# Patient Record
Sex: Female | Born: 1970 | Race: White | Hispanic: No | Marital: Single | Smoking: Former smoker
Health system: Southern US, Community
[De-identification: ages and names within clinical notes are randomized; demographics above are authoritative.]

## PROBLEM LIST (undated history)

## (undated) DIAGNOSIS — T7840XA Allergy, unspecified, initial encounter: Secondary | ICD-10-CM

## (undated) DIAGNOSIS — H40009 Preglaucoma, unspecified, unspecified eye: Secondary | ICD-10-CM

## (undated) HISTORY — PX: KNEE ARTHROPLASTY: SHX992

## (undated) HISTORY — PX: WISDOM TOOTH EXTRACTION: SHX21

## (undated) HISTORY — DX: Allergy, unspecified, initial encounter: T78.40XA

## (undated) HISTORY — DX: Preglaucoma, unspecified, unspecified eye: H40.009

---
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
## 1998-04-26 ENCOUNTER — Other Ambulatory Visit: Admission: RE | Admit: 1998-04-26 | Discharge: 1998-04-26 | Payer: Self-pay | Admitting: Obstetrics and Gynecology

## 1998-12-27 ENCOUNTER — Encounter: Admission: RE | Admit: 1998-12-27 | Discharge: 1999-01-12 | Payer: Self-pay | Admitting: Orthopedic Surgery

## 1999-10-31 ENCOUNTER — Other Ambulatory Visit: Admission: RE | Admit: 1999-10-31 | Discharge: 1999-10-31 | Payer: Self-pay | Admitting: *Deleted

## 2001-04-08 ENCOUNTER — Other Ambulatory Visit: Admission: RE | Admit: 2001-04-08 | Discharge: 2001-04-08 | Payer: Self-pay | Admitting: *Deleted

## 2002-10-01 HISTORY — PX: LEEP: SHX91

## 2003-07-26 ENCOUNTER — Other Ambulatory Visit: Admission: RE | Admit: 2003-07-26 | Discharge: 2003-07-26 | Payer: Self-pay | Admitting: Obstetrics and Gynecology

## 2004-05-08 ENCOUNTER — Other Ambulatory Visit: Admission: RE | Admit: 2004-05-08 | Discharge: 2004-05-08 | Payer: Self-pay | Admitting: Internal Medicine

## 2005-06-13 ENCOUNTER — Other Ambulatory Visit: Admission: RE | Admit: 2005-06-13 | Discharge: 2005-06-13 | Payer: Self-pay | Admitting: Internal Medicine

## 2007-05-07 ENCOUNTER — Emergency Department (HOSPITAL_COMMUNITY): Admission: EM | Admit: 2007-05-07 | Discharge: 2007-05-07 | Payer: Self-pay | Admitting: Emergency Medicine

## 2007-06-26 ENCOUNTER — Encounter: Admission: RE | Admit: 2007-06-26 | Discharge: 2007-08-20 | Payer: Self-pay | Admitting: Orthopedic Surgery

## 2011-07-16 LAB — SAMPLE TO BLOOD BANK

## 2011-07-16 LAB — DIFFERENTIAL
Basophils Absolute: 0
Basophils Relative: 1
Eosinophils Absolute: 0.2
Eosinophils Relative: 2
Lymphocytes Relative: 30
Lymphs Abs: 2.4
Monocytes Absolute: 0.8 — ABNORMAL HIGH
Monocytes Relative: 9
Neutro Abs: 4.8
Neutrophils Relative %: 58

## 2011-07-16 LAB — I-STAT 8, (EC8 V) (CONVERTED LAB)
Acid-Base Excess: 1
BUN: 13
Chloride: 107
HCT: 45
Hemoglobin: 15.3 — ABNORMAL HIGH
Operator id: 272551
Sodium: 137
pCO2, Ven: 26.3 — ABNORMAL LOW

## 2011-07-16 LAB — POCT I-STAT CREATININE
Creatinine, Ser: 0.7
Operator id: 272551

## 2011-07-16 LAB — CBC
HCT: 40.7
Hemoglobin: 14.5
MCHC: 35.6
MCV: 91.8
Platelets: 286
RBC: 4.43
RDW: 12.9
WBC: 8.2

## 2015-04-25 ENCOUNTER — Encounter: Payer: Self-pay | Admitting: Family Medicine

## 2015-04-25 ENCOUNTER — Ambulatory Visit (INDEPENDENT_AMBULATORY_CARE_PROVIDER_SITE_OTHER): Payer: Self-pay | Admitting: Family Medicine

## 2015-04-25 VITALS — BP 112/70 | HR 80 | Temp 98.1°F | Resp 18 | Ht 64.0 in | Wt 187.0 lb

## 2015-04-25 DIAGNOSIS — Z0000 Encounter for general adult medical examination without abnormal findings: Secondary | ICD-10-CM

## 2015-04-25 NOTE — Progress Notes (Signed)
Pre visit review using our clinic review tool, if applicable. No additional management support is needed unless otherwise documented below in the visit note. 

## 2015-04-25 NOTE — Patient Instructions (Signed)
Preventive Care for Adults A healthy lifestyle and preventive care can promote health and wellness. Preventive health guidelines for women include the following key practices.  A routine yearly physical is a good way to check with your health care provider about your health and preventive screening. It is a chance to share any concerns and updates on your health and to receive a thorough exam.  Visit your dentist for a routine exam and preventive care every 6 months. Brush your teeth twice a day and floss once a day. Good oral hygiene prevents tooth decay and gum disease.  The frequency of eye exams is based on your age, health, family medical history, use of contact lenses, and other factors. Follow your health care provider's recommendations for frequency of eye exams.  Eat a healthy diet. Foods like vegetables, fruits, whole grains, low-fat dairy products, and lean protein foods contain the nutrients you need without too many calories. Decrease your intake of foods high in solid fats, added sugars, and salt. Eat the right amount of calories for you.Get information about a proper diet from your health care provider, if necessary.  Regular physical exercise is one of the most important things you can do for your health. Most adults should get at least 150 minutes of moderate-intensity exercise (any activity that increases your heart rate and causes you to sweat) each week. In addition, most adults need muscle-strengthening exercises on 2 or more days a week.  Maintain a healthy weight. The body mass index (BMI) is a screening tool to identify possible weight problems. It provides an estimate of body fat based on height and weight. Your health care provider can find your BMI and can help you achieve or maintain a healthy weight.For adults 20 years and older:  A BMI below 18.5 is considered underweight.  A BMI of 18.5 to 24.9 is normal.  A BMI of 25 to 29.9 is considered overweight.  A BMI of  30 and above is considered obese.  Maintain normal blood lipids and cholesterol levels by exercising and minimizing your intake of saturated fat. Eat a balanced diet with plenty of fruit and vegetables. Blood tests for lipids and cholesterol should begin at age 76 and be repeated every 5 years. If your lipid or cholesterol levels are high, you are over 50, or you are at high risk for heart disease, you may need your cholesterol levels checked more frequently.Ongoing high lipid and cholesterol levels should be treated with medicines if diet and exercise are not working.  If you smoke, find out from your health care provider how to quit. If you do not use tobacco, do not start.  Lung cancer screening is recommended for adults aged 22-80 years who are at high risk for developing lung cancer because of a history of smoking. A yearly low-dose CT scan of the lungs is recommended for people who have at least a 30-pack-year history of smoking and are a current smoker or have quit within the past 15 years. A pack year of smoking is smoking an average of 1 pack of cigarettes a day for 1 year (for example: 1 pack a day for 30 years or 2 packs a day for 15 years). Yearly screening should continue until the smoker has stopped smoking for at least 15 years. Yearly screening should be stopped for people who develop a health problem that would prevent them from having lung cancer treatment.  If you are pregnant, do not drink alcohol. If you are breastfeeding,  be very cautious about drinking alcohol. If you are not pregnant and choose to drink alcohol, do not have more than 1 drink per day. One drink is considered to be 12 ounces (355 mL) of beer, 5 ounces (148 mL) of wine, or 1.5 ounces (44 mL) of liquor.  Avoid use of street drugs. Do not share needles with anyone. Ask for help if you need support or instructions about stopping the use of drugs.  High blood pressure causes heart disease and increases the risk of  stroke. Your blood pressure should be checked at least every 1 to 2 years. Ongoing high blood pressure should be treated with medicines if weight loss and exercise do not work.  If you are 75-52 years old, ask your health care provider if you should take aspirin to prevent strokes.  Diabetes screening involves taking a blood sample to check your fasting blood sugar level. This should be done once every 3 years, after age 15, if you are within normal weight and without risk factors for diabetes. Testing should be considered at a younger age or be carried out more frequently if you are overweight and have at least 1 risk factor for diabetes.  Breast cancer screening is essential preventive care for women. You should practice "breast self-awareness." This means understanding the normal appearance and feel of your breasts and may include breast self-examination. Any changes detected, no matter how small, should be reported to a health care provider. Women in their 58s and 30s should have a clinical breast exam (CBE) by a health care provider as part of a regular health exam every 1 to 3 years. After age 16, women should have a CBE every year. Starting at age 53, women should consider having a mammogram (breast X-ray test) every year. Women who have a family history of breast cancer should talk to their health care provider about genetic screening. Women at a high risk of breast cancer should talk to their health care providers about having an MRI and a mammogram every year.  Breast cancer gene (BRCA)-related cancer risk assessment is recommended for women who have family members with BRCA-related cancers. BRCA-related cancers include breast, ovarian, tubal, and peritoneal cancers. Having family members with these cancers may be associated with an increased risk for harmful changes (mutations) in the breast cancer genes BRCA1 and BRCA2. Results of the assessment will determine the need for genetic counseling and  BRCA1 and BRCA2 testing.  Routine pelvic exams to screen for cancer are no longer recommended for nonpregnant women who are considered low risk for cancer of the pelvic organs (ovaries, uterus, and vagina) and who do not have symptoms. Ask your health care provider if a screening pelvic exam is right for you.  If you have had past treatment for cervical cancer or a condition that could lead to cancer, you need Pap tests and screening for cancer for at least 20 years after your treatment. If Pap tests have been discontinued, your risk factors (such as having a new sexual partner) need to be reassessed to determine if screening should be resumed. Some women have medical problems that increase the chance of getting cervical cancer. In these cases, your health care provider may recommend more frequent screening and Pap tests.  The HPV test is an additional test that may be used for cervical cancer screening. The HPV test looks for the virus that can cause the cell changes on the cervix. The cells collected during the Pap test can be  tested for HPV. The HPV test could be used to screen women aged 30 years and older, and should be used in women of any age who have unclear Pap test results. After the age of 30, women should have HPV testing at the same frequency as a Pap test.  Colorectal cancer can be detected and often prevented. Most routine colorectal cancer screening begins at the age of 50 years and continues through age 75 years. However, your health care provider may recommend screening at an earlier age if you have risk factors for colon cancer. On a yearly basis, your health care provider may provide home test kits to check for hidden blood in the stool. Use of a small camera at the end of a tube, to directly examine the colon (sigmoidoscopy or colonoscopy), can detect the earliest forms of colorectal cancer. Talk to your health care provider about this at age 50, when routine screening begins. Direct  exam of the colon should be repeated every 5-10 years through age 75 years, unless early forms of pre-cancerous polyps or small growths are found.  People who are at an increased risk for hepatitis B should be screened for this virus. You are considered at high risk for hepatitis B if:  You were born in a country where hepatitis B occurs often. Talk with your health care provider about which countries are considered high risk.  Your parents were born in a high-risk country and you have not received a shot to protect against hepatitis B (hepatitis B vaccine).  You have HIV or AIDS.  You use needles to inject street drugs.  You live with, or have sex with, someone who has hepatitis B.  You get hemodialysis treatment.  You take certain medicines for conditions like cancer, organ transplantation, and autoimmune conditions.  Hepatitis C blood testing is recommended for all people born from 1945 through 1965 and any individual with known risks for hepatitis C.  Practice safe sex. Use condoms and avoid high-risk sexual practices to reduce the spread of sexually transmitted infections (STIs). STIs include gonorrhea, chlamydia, syphilis, trichomonas, herpes, HPV, and human immunodeficiency virus (HIV). Herpes, HIV, and HPV are viral illnesses that have no cure. They can result in disability, cancer, and death.  You should be screened for sexually transmitted illnesses (STIs) including gonorrhea and chlamydia if:  You are sexually active and are younger than 24 years.  You are older than 24 years and your health care provider tells you that you are at risk for this type of infection.  Your sexual activity has changed since you were last screened and you are at an increased risk for chlamydia or gonorrhea. Ask your health care provider if you are at risk.  If you are at risk of being infected with HIV, it is recommended that you take a prescription medicine daily to prevent HIV infection. This is  called preexposure prophylaxis (PrEP). You are considered at risk if:  You are a heterosexual woman, are sexually active, and are at increased risk for HIV infection.  You take drugs by injection.  You are sexually active with a partner who has HIV.  Talk with your health care provider about whether you are at high risk of being infected with HIV. If you choose to begin PrEP, you should first be tested for HIV. You should then be tested every 3 months for as long as you are taking PrEP.  Osteoporosis is a disease in which the bones lose minerals and strength   with aging. This can result in serious bone fractures or breaks. The risk of osteoporosis can be identified using a bone density scan. Women ages 65 years and over and women at risk for fractures or osteoporosis should discuss screening with their health care providers. Ask your health care provider whether you should take a calcium supplement or vitamin D to reduce the rate of osteoporosis.  Menopause can be associated with physical symptoms and risks. Hormone replacement therapy is available to decrease symptoms and risks. You should talk to your health care provider about whether hormone replacement therapy is right for you.  Use sunscreen. Apply sunscreen liberally and repeatedly throughout the day. You should seek shade when your shadow is shorter than you. Protect yourself by wearing long sleeves, pants, a wide-brimmed hat, and sunglasses year round, whenever you are outdoors.  Once a month, do a whole body skin exam, using a mirror to look at the skin on your back. Tell your health care provider of new moles, moles that have irregular borders, moles that are larger than a pencil eraser, or moles that have changed in shape or color.  Stay current with required vaccines (immunizations).  Influenza vaccine. All adults should be immunized every year.  Tetanus, diphtheria, and acellular pertussis (Td, Tdap) vaccine. Pregnant women should  receive 1 dose of Tdap vaccine during each pregnancy. The dose should be obtained regardless of the length of time since the last dose. Immunization is preferred during the 27th-36th week of gestation. An adult who has not previously received Tdap or who does not know her vaccine status should receive 1 dose of Tdap. This initial dose should be followed by tetanus and diphtheria toxoids (Td) booster doses every 10 years. Adults with an unknown or incomplete history of completing a 3-dose immunization series with Td-containing vaccines should begin or complete a primary immunization series including a Tdap dose. Adults should receive a Td booster every 10 years.  Varicella vaccine. An adult without evidence of immunity to varicella should receive 2 doses or a second dose if she has previously received 1 dose. Pregnant females who do not have evidence of immunity should receive the first dose after pregnancy. This first dose should be obtained before leaving the health care facility. The second dose should be obtained 4-8 weeks after the first dose.  Human papillomavirus (HPV) vaccine. Females aged 13-26 years who have not received the vaccine previously should obtain the 3-dose series. The vaccine is not recommended for use in pregnant females. However, pregnancy testing is not needed before receiving a dose. If a female is found to be pregnant after receiving a dose, no treatment is needed. In that case, the remaining doses should be delayed until after the pregnancy. Immunization is recommended for any person with an immunocompromised condition through the age of 26 years if she did not get any or all doses earlier. During the 3-dose series, the second dose should be obtained 4-8 weeks after the first dose. The third dose should be obtained 24 weeks after the first dose and 16 weeks after the second dose.  Zoster vaccine. One dose is recommended for adults aged 60 years or older unless certain conditions are  present.  Measles, mumps, and rubella (MMR) vaccine. Adults born before 1957 generally are considered immune to measles and mumps. Adults born in 1957 or later should have 1 or more doses of MMR vaccine unless there is a contraindication to the vaccine or there is laboratory evidence of immunity to   each of the three diseases. A routine second dose of MMR vaccine should be obtained at least 28 days after the first dose for students attending postsecondary schools, health care workers, or international travelers. People who received inactivated measles vaccine or an unknown type of measles vaccine during 1963-1967 should receive 2 doses of MMR vaccine. People who received inactivated mumps vaccine or an unknown type of mumps vaccine before 1979 and are at high risk for mumps infection should consider immunization with 2 doses of MMR vaccine. For females of childbearing age, rubella immunity should be determined. If there is no evidence of immunity, females who are not pregnant should be vaccinated. If there is no evidence of immunity, females who are pregnant should delay immunization until after pregnancy. Unvaccinated health care workers born before 1957 who lack laboratory evidence of measles, mumps, or rubella immunity or laboratory confirmation of disease should consider measles and mumps immunization with 2 doses of MMR vaccine or rubella immunization with 1 dose of MMR vaccine.  Pneumococcal 13-valent conjugate (PCV13) vaccine. When indicated, a person who is uncertain of her immunization history and has no record of immunization should receive the PCV13 vaccine. An adult aged 19 years or older who has certain medical conditions and has not been previously immunized should receive 1 dose of PCV13 vaccine. This PCV13 should be followed with a dose of pneumococcal polysaccharide (PPSV23) vaccine. The PPSV23 vaccine dose should be obtained at least 8 weeks after the dose of PCV13 vaccine. An adult aged 19  years or older who has certain medical conditions and previously received 1 or more doses of PPSV23 vaccine should receive 1 dose of PCV13. The PCV13 vaccine dose should be obtained 1 or more years after the last PPSV23 vaccine dose.  Pneumococcal polysaccharide (PPSV23) vaccine. When PCV13 is also indicated, PCV13 should be obtained first. All adults aged 65 years and older should be immunized. An adult younger than age 65 years who has certain medical conditions should be immunized. Any person who resides in a nursing home or long-term care facility should be immunized. An adult smoker should be immunized. People with an immunocompromised condition and certain other conditions should receive both PCV13 and PPSV23 vaccines. People with human immunodeficiency virus (HIV) infection should be immunized as soon as possible after diagnosis. Immunization during chemotherapy or radiation therapy should be avoided. Routine use of PPSV23 vaccine is not recommended for American Indians, [place] Natives, or people younger than 65 years unless there are medical conditions that require PPSV23 vaccine. When indicated, people who have unknown immunization and have no record of immunization should receive PPSV23 vaccine. One-time revaccination 5 years after the first dose of PPSV23 is recommended for people aged 19-64 years who have chronic kidney failure, nephrotic syndrome, asplenia, or immunocompromised conditions. People who received 1-2 doses of PPSV23 before age 65 years should receive another dose of PPSV23 vaccine at age 65 years or later if at least 5 years have passed since the previous dose. Doses of PPSV23 are not needed for people immunized with PPSV23 at or after age 65 years.  Meningococcal vaccine. Adults with asplenia or persistent complement component deficiencies should receive 2 doses of quadrivalent meningococcal conjugate (MenACWY-D) vaccine. The doses should be obtained at least 2 months apart.  Microbiologists working with certain meningococcal bacteria, military recruits, people at risk during an outbreak, and people who travel to or live in countries with a high rate of meningitis should be immunized. A first-year college student up through age   21 years who is living in a residence hall should receive a dose if she did not receive a dose on or after her 16th birthday. Adults who have certain high-risk conditions should receive one or more doses of vaccine.  Hepatitis A vaccine. Adults who wish to be protected from this disease, have certain high-risk conditions, work with hepatitis A-infected animals, work in hepatitis A research labs, or travel to or work in countries with a high rate of hepatitis A should be immunized. Adults who were previously unvaccinated and who anticipate close contact with an international adoptee during the first 60 days after arrival in the [place] from a country with a high rate of hepatitis A should be immunized.  Hepatitis B vaccine. Adults who wish to be protected from this disease, have certain high-risk conditions, may be exposed to blood or other infectious body fluids, are household contacts or sex partners of hepatitis B positive people, are clients or workers in certain care facilities, or travel to or work in countries with a high rate of hepatitis B should be immunized.  Haemophilus influenzae type b (Hib) vaccine. A previously unvaccinated person with asplenia or sickle cell disease or having a scheduled splenectomy should receive 1 dose of Hib vaccine. Regardless of previous immunization, a recipient of a hematopoietic stem cell transplant should receive a 3-dose series 6-12 months after her successful transplant. Hib vaccine is not recommended for adults with HIV infection. Preventive Services / Frequency Ages 64 to 68 years  Blood pressure check.** / Every 1 to 2 years.  Lipid and cholesterol check.** / Every 5 years beginning at age  22.  Clinical breast exam.** / Every 3 years for women in their 88s and 53s.  BRCA-related cancer risk assessment.** / For women who have family members with a BRCA-related cancer (breast, ovarian, tubal, or peritoneal cancers).  Pap test.** / Every 2 years from ages 90 through 51. Every 3 years starting at age 21 through age 56 or 3 with a history of 3 consecutive normal Pap tests.  HPV screening.** / Every 3 years from ages 24 through ages 1 to 46 with a history of 3 consecutive normal Pap tests.  Hepatitis C blood test.** / For any individual with known risks for hepatitis C.  Skin self-exam. / Monthly.  Influenza vaccine. / Every year.  Tetanus, diphtheria, and acellular pertussis (Tdap, Td) vaccine.** / Consult your health care provider. Pregnant women should receive 1 dose of Tdap vaccine during each pregnancy. 1 dose of Td every 10 years.  Varicella vaccine.** / Consult your health care provider. Pregnant females who do not have evidence of immunity should receive the first dose after pregnancy.  HPV vaccine. / 3 doses over 6 months, if 72 and younger. The vaccine is not recommended for use in pregnant females. However, pregnancy testing is not needed before receiving a dose.  Measles, mumps, rubella (MMR) vaccine.** / You need at least 1 dose of MMR if you were born in 1957 or later. You may also need a 2nd dose. For females of childbearing age, rubella immunity should be determined. If there is no evidence of immunity, females who are not pregnant should be vaccinated. If there is no evidence of immunity, females who are pregnant should delay immunization until after pregnancy.  Pneumococcal 13-valent conjugate (PCV13) vaccine.** / Consult your health care provider.  Pneumococcal polysaccharide (PPSV23) vaccine.** / 1 to 2 doses if you smoke cigarettes or if you have certain conditions.  Meningococcal vaccine.** /  1 dose if you are age 19 to 21 years and a first-year college  student living in a residence hall, or have one of several medical conditions, you need to get vaccinated against meningococcal disease. You may also need additional booster doses.  Hepatitis A vaccine.** / Consult your health care provider.  Hepatitis B vaccine.** / Consult your health care provider.  Haemophilus influenzae type b (Hib) vaccine.** / Consult your health care provider. Ages 40 to 64 years  Blood pressure check.** / Every 1 to 2 years.  Lipid and cholesterol check.** / Every 5 years beginning at age 20 years.  Lung cancer screening. / Every year if you are aged 55-80 years and have a 30-pack-year history of smoking and currently smoke or have quit within the past 15 years. Yearly screening is stopped once you have quit smoking for at least 15 years or develop a health problem that would prevent you from having lung cancer treatment.  Clinical breast exam.** / Every year after age 40 years.  BRCA-related cancer risk assessment.** / For women who have family members with a BRCA-related cancer (breast, ovarian, tubal, or peritoneal cancers).  Mammogram.** / Every year beginning at age 40 years and continuing for as long as you are in good health. Consult with your health care provider.  Pap test.** / Every 3 years starting at age 30 years through age 65 or 70 years with a history of 3 consecutive normal Pap tests.  HPV screening.** / Every 3 years from ages 30 years through ages 65 to 70 years with a history of 3 consecutive normal Pap tests.  Fecal occult blood test (FOBT) of stool. / Every year beginning at age 50 years and continuing until age 75 years. You may not need to do this test if you get a colonoscopy every 10 years.  Flexible sigmoidoscopy or colonoscopy.** / Every 5 years for a flexible sigmoidoscopy or every 10 years for a colonoscopy beginning at age 50 years and continuing until age 75 years.  Hepatitis C blood test.** / For all people born from 1945 through  1965 and any individual with known risks for hepatitis C.  Skin self-exam. / Monthly.  Influenza vaccine. / Every year.  Tetanus, diphtheria, and acellular pertussis (Tdap/Td) vaccine.** / Consult your health care provider. Pregnant women should receive 1 dose of Tdap vaccine during each pregnancy. 1 dose of Td every 10 years.  Varicella vaccine.** / Consult your health care provider. Pregnant females who do not have evidence of immunity should receive the first dose after pregnancy.  Zoster vaccine.** / 1 dose for adults aged 60 years or older.  Measles, mumps, rubella (MMR) vaccine.** / You need at least 1 dose of MMR if you were born in 1957 or later. You may also need a 2nd dose. For females of childbearing age, rubella immunity should be determined. If there is no evidence of immunity, females who are not pregnant should be vaccinated. If there is no evidence of immunity, females who are pregnant should delay immunization until after pregnancy.  Pneumococcal 13-valent conjugate (PCV13) vaccine.** / Consult your health care provider.  Pneumococcal polysaccharide (PPSV23) vaccine.** / 1 to 2 doses if you smoke cigarettes or if you have certain conditions.  Meningococcal vaccine.** / Consult your health care provider.  Hepatitis A vaccine.** / Consult your health care provider.  Hepatitis B vaccine.** / Consult your health care provider.  Haemophilus influenzae type b (Hib) vaccine.** / Consult your health care provider. Ages 65   years and over  Blood pressure check.** / Every 1 to 2 years.  Lipid and cholesterol check.** / Every 5 years beginning at age 22 years.  Lung cancer screening. / Every year if you are aged 73-80 years and have a 30-pack-year history of smoking and currently smoke or have quit within the past 15 years. Yearly screening is stopped once you have quit smoking for at least 15 years or develop a health problem that would prevent you from having lung cancer  treatment.  Clinical breast exam.** / Every year after age 4 years.  BRCA-related cancer risk assessment.** / For women who have family members with a BRCA-related cancer (breast, ovarian, tubal, or peritoneal cancers).  Mammogram.** / Every year beginning at age 40 years and continuing for as long as you are in good health. Consult with your health care provider.  Pap test.** / Every 3 years starting at age 9 years through age 34 or 91 years with 3 consecutive normal Pap tests. Testing can be stopped between 65 and 70 years with 3 consecutive normal Pap tests and no abnormal Pap or HPV tests in the past 10 years.  HPV screening.** / Every 3 years from ages 57 years through ages 64 or 45 years with a history of 3 consecutive normal Pap tests. Testing can be stopped between 65 and 70 years with 3 consecutive normal Pap tests and no abnormal Pap or HPV tests in the past 10 years.  Fecal occult blood test (FOBT) of stool. / Every year beginning at age 15 years and continuing until age 17 years. You may not need to do this test if you get a colonoscopy every 10 years.  Flexible sigmoidoscopy or colonoscopy.** / Every 5 years for a flexible sigmoidoscopy or every 10 years for a colonoscopy beginning at age 86 years and continuing until age 71 years.  Hepatitis C blood test.** / For all people born from [birth year] through 1966 and any individual with known risks for hepatitis C.  Osteoporosis screening.** / A one-time screening for women ages 83 years and over and women at risk for fractures or osteoporosis.  Skin self-exam. / Monthly.  Influenza vaccine. / Every year.  Tetanus, diphtheria, and acellular pertussis (Tdap/Td) vaccine.** / 1 dose of Td every 10 years.  Varicella vaccine.** / Consult your health care provider.  Zoster vaccine.** / 1 dose for adults aged 61 years or older.  Pneumococcal 13-valent conjugate (PCV13) vaccine.** / Consult your health care provider.  Pneumococcal  polysaccharide (PPSV23) vaccine.** / 1 dose for all adults aged 28 years and older.  Meningococcal vaccine.** / Consult your health care provider.  Hepatitis A vaccine.** / Consult your health care provider.  Hepatitis B vaccine.** / Consult your health care provider.  Haemophilus influenzae type b (Hib) vaccine.** / Consult your health care provider. ** Family history and personal history of risk and conditions may change your health care provider's recommendations. Document Released: 11/13/2001 Document Revised: 02/01/2014 Document Reviewed: 02/12/2011 [facility] Patient Information 2015 [place], [place]. This information is not intended to replace advice given to you by your health care provider. Make sure you discuss any questions you have with your health care provider.

## 2015-04-25 NOTE — Progress Notes (Signed)
Subjective:     Cindy Christian is a 44 y.o. female and is here for a comprehensive physical exam. The patient reports problems - LUQ pain---she noticed 5 years ago a tearing sensation while doing a plank.  .  History   Social History  . Marital Status: Single    Spouse Name: N/A  . Number of Children: N/A  . Years of Education: N/A   Occupational History  . Not on file.   Social History Main Topics  . Smoking status: Former Research scientist (life sciences)  . Smokeless tobacco: Not on file  . Alcohol Use: 0.0 oz/week    0 Standard drinks or equivalent per week  . Drug Use: No  . Sexual Activity: Not on file   Other Topics Concern  . Not on file   Social History Narrative  . No narrative on file   Health Maintenance  Topic Date Due  . TETANUS/TDAP  11/14/1988  . HIV Screening  04/24/2016 (Originally 11/14/1984)  . INFLUENZA VACCINE  05/02/2015  . PAP SMEAR  08/13/2015    The following portions of the patient's history were reviewed and updated as appropriate:  She  has a past medical history of Borderline glaucoma. She  does not have a problem list on file. She  has past surgical history that includes Knee Arthroplasty (Bilateral, 2000 R 2008 L); Finger fracture surgery (Left, 1983); and LEEP (2004). Her family history includes Diabetes in her maternal uncle; Stroke in her maternal grandmother. She  reports that she quit smoking about 17 years ago. She does not have any smokeless tobacco history on file. She reports that she drinks about 9.6 oz of alcohol per week. She reports that she does not use illicit drugs. She has a current medication list which includes the following prescription(s): b complex vitamins, ginseng, kelp, turmeric, and vitamin e. No current outpatient prescriptions on file prior to visit.   No current facility-administered medications on file prior to visit.   She is allergic to penicillins..  Review of Systems Review of Systems  Constitutional: Negative for activity  change, appetite change and fatigue.  HENT: Negative for hearing loss, congestion, tinnitus and ear discharge.  dentist q6m Eyes: Negative for visual disturbance (see optho q1y -- vision corrected to 20/20 with glasses and contacts).  Respiratory: Negative for cough, chest tightness and shortness of breath.   Cardiovascular: Negative for chest pain, palpitations and leg swelling.  Gastrointestinal: Negative for abdominal pain, diarrhea, constipation and abdominal distention.  Genitourinary: Negative for urgency, frequency, decreased urine volume and difficulty urinating.  Musculoskeletal: Negative for back pain, arthralgias and gait problem.  Skin: Negative for color change, pallor and rash.  Neurological: Negative for dizziness, light-headedness, numbness and headaches.  Hematological: Negative for adenopathy. Does not bruise/bleed easily.  Psychiatric/Behavioral: Negative for suicidal ideas, confusion, sleep disturbance, self-injury, dysphoric mood, decreased concentration and agitation.       Objective:    BP 112/70 mmHg  Pulse 80  Temp(Src) 98.1 F (36.7 C) (Oral)  Resp 18  Ht 5\' 4"  (1.626 m)  Wt 187 lb (84.823 kg)  BMI 32.08 kg/m2  SpO2 98%  LMP 04/22/2015 General appearance: alert, cooperative, appears stated age and no distress Head: Normocephalic, without obvious abnormality, atraumatic Eyes: conjunctivae/corneas clear. PERRL, EOM's intact. Fundi benign. Ears: normal TM's and external ear canals both ears Nose: Nares normal. Septum midline. Mucosa normal. No drainage or sinus tenderness. Throat: lips, mucosa, and tongue normal; teeth and gums normal Neck: no adenopathy, no carotid bruit, no JVD,  supple, symmetrical, trachea midline and thyroid not enlarged, symmetric, no tenderness/mass/nodules Back: symmetric, no curvature. ROM normal. No CVA tenderness. Lungs: clear to auscultation bilaterally Breast-- deferred Heart: S1, S2 normal Abdomen: soft, non-tender; bowel  sounds normal; no masses,  no organomegaly Pelvic: deferred Extremities: extremities normal, atraumatic, no cyanosis or edema Pulses: 2+ and symmetric Skin: Skin color, texture, turgor normal. No rashes or lesions Lymph nodes: Cervical, supraclavicular, and axillary nodes normal. Neurologic: Alert and oriented X 3, normal strength and tone. Normal symmetric reflexes. Normal coordination and gait Psych--no depression,no anxiety      Assessment:    Healthy female exam.     Plan:  Check labs    ghm utd See After Visit Summary for Counseling Recommendations   1. Preventative health care   - Basic metabolic panel; Future - CBC with Differential/Platelet; Future - Hepatic function panel; Future - Lipid panel; Future - POCT urinalysis dipstick; Future - TSH; Future

## 2015-04-28 ENCOUNTER — Other Ambulatory Visit (INDEPENDENT_AMBULATORY_CARE_PROVIDER_SITE_OTHER): Payer: Self-pay

## 2015-04-28 DIAGNOSIS — Z0000 Encounter for general adult medical examination without abnormal findings: Secondary | ICD-10-CM

## 2015-04-28 LAB — CBC WITH DIFFERENTIAL/PLATELET
Basophils Absolute: 0 10*3/uL (ref 0.0–0.1)
Basophils Relative: 0.7 % (ref 0.0–3.0)
EOS ABS: 0.2 10*3/uL (ref 0.0–0.7)
EOS PCT: 2.8 % (ref 0.0–5.0)
HEMATOCRIT: 39.5 % (ref 36.0–46.0)
Hemoglobin: 13.5 g/dL (ref 12.0–15.0)
LYMPHS ABS: 2.1 10*3/uL (ref 0.7–4.0)
Lymphocytes Relative: 38.4 % (ref 12.0–46.0)
MCHC: 34.2 g/dL (ref 30.0–36.0)
MCV: 93.6 fl (ref 78.0–100.0)
MONO ABS: 0.5 10*3/uL (ref 0.1–1.0)
MONOS PCT: 8.4 % (ref 3.0–12.0)
NEUTROS PCT: 49.7 % (ref 43.0–77.0)
Neutro Abs: 2.7 10*3/uL (ref 1.4–7.7)
PLATELETS: 265 10*3/uL (ref 150.0–400.0)
RBC: 4.22 Mil/uL (ref 3.87–5.11)
RDW: 13 % (ref 11.5–15.5)
WBC: 5.4 10*3/uL (ref 4.0–10.5)

## 2015-04-28 LAB — LIPID PANEL
CHOL/HDL RATIO: 3
Cholesterol: 175 mg/dL (ref 0–200)
HDL: 50.9 mg/dL (ref >39.00)
LDL CALC: 107 mg/dL — AB (ref 0–99)
NONHDL: 124.49
TRIGLYCERIDES: 87 mg/dL (ref 0.0–149.0)
VLDL: 17.4 mg/dL (ref 0.0–40.0)

## 2015-04-28 LAB — BASIC METABOLIC PANEL
BUN: 15 mg/dL (ref 6–23)
CALCIUM: 8.8 mg/dL (ref 8.4–10.5)
CHLORIDE: 104 meq/L (ref 96–112)
CO2: 27 meq/L (ref 19–32)
Creatinine, Ser: 0.59 mg/dL (ref 0.40–1.20)
GFR: 116.92 mL/min (ref >60.00)
GLUCOSE: 80 mg/dL (ref 70–99)
POTASSIUM: 3.8 meq/L (ref 3.5–5.1)
SODIUM: 137 meq/L (ref 135–145)

## 2015-04-28 LAB — HEPATIC FUNCTION PANEL
ALBUMIN: 4.2 g/dL (ref 3.5–5.2)
ALK PHOS: 44 U/L (ref 39–117)
ALT: 14 U/L (ref 0–35)
AST: 19 U/L (ref 0–37)
Bilirubin, Direct: 0.2 mg/dL (ref 0.0–0.3)
Total Bilirubin: 0.8 mg/dL (ref 0.2–1.2)
Total Protein: 7.4 g/dL (ref 6.0–8.3)

## 2015-04-28 LAB — TSH: TSH: 2.23 u[IU]/mL (ref 0.35–4.50)

## 2017-08-02 ENCOUNTER — Encounter: Payer: Self-pay | Admitting: Family Medicine

## 2017-08-02 ENCOUNTER — Ambulatory Visit (HOSPITAL_BASED_OUTPATIENT_CLINIC_OR_DEPARTMENT_OTHER)
Admission: RE | Admit: 2017-08-02 | Discharge: 2017-08-02 | Disposition: A | Discharge status: Home / Self Care | Payer: Self-pay | Source: Ambulatory Visit | Attending: Family Medicine | Admitting: Family Medicine

## 2017-08-02 ENCOUNTER — Other Ambulatory Visit (HOSPITAL_COMMUNITY)
Admission: RE | Admit: 2017-08-02 | Discharge: 2017-08-02 | Disposition: A | Discharge status: Home / Self Care | Payer: Self-pay | Source: Ambulatory Visit | Attending: Family Medicine | Admitting: Family Medicine

## 2017-08-02 ENCOUNTER — Encounter (HOSPITAL_BASED_OUTPATIENT_CLINIC_OR_DEPARTMENT_OTHER): Payer: Self-pay

## 2017-08-02 ENCOUNTER — Other Ambulatory Visit: Payer: Self-pay | Admitting: Family Medicine

## 2017-08-02 ENCOUNTER — Ambulatory Visit (INDEPENDENT_AMBULATORY_CARE_PROVIDER_SITE_OTHER): Payer: Self-pay | Admitting: Family Medicine

## 2017-08-02 VITALS — BP 112/81 | HR 79 | Temp 98.6°F | Resp 18 | Ht 64.0 in | Wt 194.6 lb

## 2017-08-02 DIAGNOSIS — Z0000 Encounter for general adult medical examination without abnormal findings: Secondary | ICD-10-CM | POA: Insufficient documentation

## 2017-08-02 DIAGNOSIS — Z124 Encounter for screening for malignant neoplasm of cervix: Secondary | ICD-10-CM | POA: Insufficient documentation

## 2017-08-02 DIAGNOSIS — Z1231 Encounter for screening mammogram for malignant neoplasm of breast: Secondary | ICD-10-CM | POA: Insufficient documentation

## 2017-08-02 LAB — CBC WITH DIFFERENTIAL/PLATELET
BASOS ABS: 0.1 10*3/uL (ref 0.0–0.1)
Basophils Relative: 0.8 % (ref 0.0–3.0)
Eosinophils Absolute: 0.2 10*3/uL (ref 0.0–0.7)
Eosinophils Relative: 2.6 % (ref 0.0–5.0)
HEMATOCRIT: 43.2 % (ref 36.0–46.0)
Hemoglobin: 14.1 g/dL (ref 12.0–15.0)
LYMPHS PCT: 37.8 % (ref 12.0–46.0)
Lymphs Abs: 2.5 10*3/uL (ref 0.7–4.0)
MCHC: 32.7 g/dL (ref 30.0–36.0)
MCV: 97.1 fl (ref 78.0–100.0)
MONOS PCT: 9.2 % (ref 3.0–12.0)
Monocytes Absolute: 0.6 10*3/uL (ref 0.1–1.0)
Neutro Abs: 3.3 10*3/uL (ref 1.4–7.7)
Neutrophils Relative %: 49.6 % (ref 43.0–77.0)
Platelets: 286 10*3/uL (ref 150.0–400.0)
RBC: 4.45 Mil/uL (ref 3.87–5.11)
RDW: 13.2 % (ref 11.5–15.5)
WBC: 6.6 10*3/uL (ref 4.0–10.5)

## 2017-08-02 LAB — COMPREHENSIVE METABOLIC PANEL
ALBUMIN: 4.4 g/dL (ref 3.5–5.2)
ALT: 19 U/L (ref 0–35)
AST: 21 U/L (ref 0–37)
Alkaline Phosphatase: 42 U/L (ref 39–117)
BILIRUBIN TOTAL: 0.6 mg/dL (ref 0.2–1.2)
BUN: 14 mg/dL (ref 6–23)
CALCIUM: 9.6 mg/dL (ref 8.4–10.5)
CO2: 29 meq/L (ref 19–32)
CREATININE: 0.54 mg/dL (ref 0.40–1.20)
Chloride: 102 mEq/L (ref 96–112)
GFR: 128.22 mL/min (ref >60.00)
Glucose, Bld: 87 mg/dL (ref 70–99)
Potassium: 4.1 mEq/L (ref 3.5–5.1)
Sodium: 137 mEq/L (ref 135–145)
Total Protein: 7.9 g/dL (ref 6.0–8.3)

## 2017-08-02 LAB — LIPID PANEL
CHOL/HDL RATIO: 3
Cholesterol: 184 mg/dL (ref 0–200)
HDL: 56.7 mg/dL (ref >39.00)
LDL Cholesterol: 111 mg/dL — ABNORMAL HIGH (ref 0–99)
NONHDL: 127.38
Triglycerides: 82 mg/dL (ref 0.0–149.0)
VLDL: 16.4 mg/dL (ref 0.0–40.0)

## 2017-08-02 LAB — POC URINALSYSI DIPSTICK (AUTOMATED)
Bilirubin, UA: NEGATIVE
Blood, UA: NEGATIVE
GLUCOSE UA: 100
KETONES UA: NEGATIVE
Leukocytes, UA: NEGATIVE
Nitrite, UA: NEGATIVE
PH UA: 6 (ref 5.0–8.0)
PROTEIN UA: NEGATIVE
Spec Grav, UA: 1.01 (ref 1.010–1.025)
UROBILINOGEN UA: 0.2 U/dL

## 2017-08-02 LAB — TSH: TSH: 2.29 u[IU]/mL (ref 0.35–4.50)

## 2017-08-02 NOTE — Patient Instructions (Signed)
Preventive Care 40-64 Years, Female Preventive care refers to lifestyle choices and visits with your health care provider that can promote health and wellness. What does preventive care include?  A yearly physical exam. This is also called an annual well check.  Dental exams once or twice a year.  Routine eye exams. Ask your health care provider how often you should have your eyes checked.  Personal lifestyle choices, including: ? Daily care of your teeth and gums. ? Regular physical activity. ? Eating a healthy diet. ? Avoiding tobacco and drug use. ? Limiting alcohol use. ? Practicing safe sex. ? Taking low-dose aspirin daily starting at age 37. ? Taking vitamin and mineral supplements as recommended by your health care provider. What happens during an annual well check? The services and screenings done by your health care provider during your annual well check will depend on your age, overall health, lifestyle risk factors, and family history of disease. Counseling Your health care provider may ask you questions about your:  Alcohol use.  Tobacco use.  Drug use.  Emotional well-being.  Home and relationship well-being.  Sexual activity.  Eating habits.  Work and work Statistician.  Method of birth control.  Menstrual cycle.  Pregnancy history.  Screening You may have the following tests or measurements:  Height, weight, and BMI.  Blood pressure.  Lipid and cholesterol levels. These may be checked every 5 years, or more frequently if you are over 12 years old.  Skin check.  Lung cancer screening. You may have this screening every year starting at age 19 if you have a 30-pack-year history of smoking and currently smoke or have quit within the past 15 years.  Fecal occult blood test (FOBT) of the stool. You may have this test every year starting at age 60.  Flexible sigmoidoscopy or colonoscopy. You may have a sigmoidoscopy every 5 years or a colonoscopy  every 10 years starting at age 70.  Hepatitis C blood test.  Hepatitis B blood test.  Sexually transmitted disease (STD) testing.  Diabetes screening. This is done by checking your blood sugar (glucose) after you have not eaten for a while (fasting). You may have this done every 1-3 years.  Mammogram. This may be done every 1-2 years. Talk to your health care provider about when you should start having regular mammograms. This may depend on whether you have a family history of breast cancer.  BRCA-related cancer screening. This may be done if you have a family history of breast, ovarian, tubal, or peritoneal cancers.  Pelvic exam and Pap test. This may be done every 3 years starting at age 32. Starting at age 27, this may be done every 5 years if you have a Pap test in combination with an HPV test.  Bone density scan. This is done to screen for osteoporosis. You may have this scan if you are at high risk for osteoporosis.  Discuss your test results, treatment options, and if necessary, the need for more tests with your health care provider. Vaccines Your health care provider may recommend certain vaccines, such as:  Influenza vaccine. This is recommended every year.  Tetanus, diphtheria, and acellular pertussis (Tdap, Td) vaccine. You may need a Td booster every 10 years.  Varicella vaccine. You may need this if you have not been vaccinated.  Zoster vaccine. You may need this after age 36.  Measles, mumps, and rubella (MMR) vaccine. You may need at least one dose of MMR if you were born in  1958 or later. You may also need a second dose.  Pneumococcal 13-valent conjugate (PCV13) vaccine. You may need this if you have certain conditions and were not previously vaccinated.  Pneumococcal polysaccharide (PPSV23) vaccine. You may need one or two doses if you smoke cigarettes or if you have certain conditions.  Meningococcal vaccine. You may need this if you have certain  conditions.  Hepatitis A vaccine. You may need this if you have certain conditions or if you travel or work in places where you may be exposed to hepatitis A.  Hepatitis B vaccine. You may need this if you have certain conditions or if you travel or work in places where you may be exposed to hepatitis B.  Haemophilus influenzae type b (Hib) vaccine. You may need this if you have certain conditions.  Talk to your health care provider about which screenings and vaccines you need and how often you need them. This information is not intended to replace advice given to you by your health care provider. Make sure you discuss any questions you have with your health care provider. Document Released: 10/14/2015 Document Revised: 06/06/2016 Document Reviewed: 07/19/2015 Elsevier Interactive Patient Education  2017 [employer].

## 2017-08-02 NOTE — Progress Notes (Signed)
Subjective:     Cindy Christian is a 46 y.o. female and is here for a comprehensive physical exam. The patient reports no problems.  Social History   Social History  . Marital status: Single    Spouse name: N/A  . Number of children: N/A  . Years of education: N/A   Occupational History  . admin     Psychologist, counselling   Social History Main Topics  . Smoking status: Former Smoker    Packs/day: 0.50    Years: 13.00    Quit date: 04/24/1998  . Smokeless tobacco: Not on file  . Alcohol use 9.6 oz/week    12 Cans of beer, 4 Glasses of wine per week  . Drug use: No  . Sexual activity: Yes    Partners: Male   Other Topics Concern  . Not on file   Social History Narrative   Walking daily   Health Maintenance  Topic Date Due  . HIV Screening  11/14/1984  . TETANUS/TDAP  11/14/1988  . PAP SMEAR  08/13/2015  . INFLUENZA VACCINE  05/01/2017    The following portions of the patient's history were reviewed and updated as appropriate:  She  has a past medical history of Borderline glaucoma. She  does not have a problem list on file. She  has a past surgical history that includes Knee Arthroplasty (Bilateral, 2000 R 2008 L); Finger fracture surgery (Left, 1983); and LEEP (2004). Her family history includes Diabetes in her maternal uncle; Stroke in her maternal grandmother. She  reports that she quit smoking about 19 years ago. She has a 6.50 pack-year smoking history. She does not have any smokeless tobacco history on file. She reports that she drinks about 9.6 oz of alcohol per week . She reports that she does not use drugs. She has a current medication list which includes the following prescription(s): b complex vitamins, ginseng, kelp, turmeric, and vitamin e. Current Outpatient Prescriptions on File Prior to Visit  Medication Sig Dispense Refill  . B Complex Vitamins (B COMPLEX 1 PO) Take by mouth.    Marland Kitchen GINSENG PO Take by mouth.    . Kelp 150 MCG TABS Take by mouth.    .  TURMERIC PO Take by mouth.    . vitamin E 200 UNIT capsule Take 200 Units by mouth daily.     No current facility-administered medications on file prior to visit.    She is allergic to penicillins..  Review of Systems Review of Systems  Constitutional: Negative for activity change, appetite change and fatigue.  HENT: Negative for hearing loss, congestion, tinnitus and ear discharge.  dentist q86m Eyes: Negative for visual disturbance (see optho q1y -- vision corrected to 20/20 with glasses).  Respiratory: Negative for cough, chest tightness and shortness of breath.   Cardiovascular: Negative for chest pain, palpitations and leg swelling.  Gastrointestinal: Negative for abdominal pain, diarrhea, constipation and abdominal distention.  Genitourinary: Negative for urgency, frequency, decreased urine volume and difficulty urinating.  Musculoskeletal: Negative for back pain, arthralgias and gait problem.  Skin: Negative for color change, pallor and rash.  Neurological: Negative for dizziness, light-headedness, numbness and headaches.  Hematological: Negative for adenopathy. Does not bruise/bleed easily.  Psychiatric/Behavioral: Negative for suicidal ideas, confusion, sleep disturbance, self-injury, dysphoric mood, decreased concentration and agitation.       Objective:    BP 112/81 (BP Location: Right Arm, Patient Position: Sitting, Cuff Size: Small)   Pulse 79   Temp 98.6 F (37 C) (Oral)  Resp 18   Ht 5\' 4"  (1.626 m)   Wt 194 lb 9.6 oz (88.3 kg)   SpO2 98%   BMI 33.40 kg/m  General appearance: alert, cooperative, appears stated age and no distress Head: Normocephalic, without obvious abnormality, atraumatic Eyes: conjunctivae/corneas clear. PERRL, EOM's intact. Fundi benign. Ears: normal TM's and external ear canals both ears Nose: Nares normal. Septum midline. Mucosa normal. No drainage or sinus tenderness. Throat: lips, mucosa, and tongue normal; teeth and gums normal Neck:  no adenopathy, no carotid bruit, no JVD, supple, symmetrical, trachea midline and thyroid not enlarged, symmetric, no tenderness/mass/nodules Back: symmetric, no curvature. ROM normal. No CVA tenderness. Lungs: clear to auscultation bilaterally Breasts: normal appearance, no masses or tenderness Heart: regular rate and rhythm, S1, S2 normal, no murmur, click, rub or gallop Abdomen: soft, non-tender; bowel sounds normal; no masses,  no organomegaly Pelvic: cervix normal in appearance, external genitalia normal, no adnexal masses or tenderness, no cervical motion tenderness, rectovaginal septum normal, uterus normal size, shape, and consistency, vagina normal without discharge and pap done, rectal- heme neg brown stool Extremities: extremities normal, atraumatic, no cyanosis or edema Pulses: 2+ and symmetric Skin: Skin color, texture, turgor normal. No rashes or lesions Lymph nodes: Cervical, supraclavicular, and axillary nodes normal. Neurologic: Alert and oriented X 3, normal strength and tone. Normal symmetric reflexes. Normal coordination and gait    Assessment:    Healthy female exam.      Plan:    ghm utd Check labs  See After Visit Summary for Counseling Recommendations    1. Cervical cancer screening   - Cytology - PAP  2. Preventative health care  See above - Lipid panel - CBC with Differential/Platelet - TSH - Comprehensive metabolic panel - POCT Urinalysis Dipstick (Automated)

## 2017-08-06 LAB — CYTOLOGY - PAP
DIAGNOSIS: NEGATIVE
HPV: NOT DETECTED

## 2019-06-23 ENCOUNTER — Other Ambulatory Visit (HOSPITAL_BASED_OUTPATIENT_CLINIC_OR_DEPARTMENT_OTHER): Payer: Self-pay | Admitting: Family Medicine

## 2019-06-23 DIAGNOSIS — Z1231 Encounter for screening mammogram for malignant neoplasm of breast: Secondary | ICD-10-CM

## 2019-10-29 ENCOUNTER — Other Ambulatory Visit: Payer: Self-pay

## 2019-10-29 ENCOUNTER — Ambulatory Visit (INDEPENDENT_AMBULATORY_CARE_PROVIDER_SITE_OTHER): Payer: Self-pay | Admitting: Family Medicine

## 2019-10-29 ENCOUNTER — Other Ambulatory Visit (HOSPITAL_COMMUNITY)
Admission: RE | Admit: 2019-10-29 | Discharge: 2019-10-29 | Disposition: A | Discharge status: Home / Self Care | Payer: Self-pay | Source: Ambulatory Visit | Attending: Family Medicine | Admitting: Family Medicine

## 2019-10-29 ENCOUNTER — Ambulatory Visit (HOSPITAL_BASED_OUTPATIENT_CLINIC_OR_DEPARTMENT_OTHER)
Admission: RE | Admit: 2019-10-29 | Discharge: 2019-10-29 | Disposition: A | Discharge status: Home / Self Care | Payer: Self-pay | Source: Ambulatory Visit | Attending: Family Medicine | Admitting: Family Medicine

## 2019-10-29 ENCOUNTER — Encounter: Payer: Self-pay | Admitting: Family Medicine

## 2019-10-29 VITALS — BP 120/84 | HR 81 | Temp 97.4°F | Resp 18 | Ht 64.0 in | Wt 200.6 lb

## 2019-10-29 DIAGNOSIS — Z1211 Encounter for screening for malignant neoplasm of colon: Secondary | ICD-10-CM

## 2019-10-29 DIAGNOSIS — Z0000 Encounter for general adult medical examination without abnormal findings: Secondary | ICD-10-CM

## 2019-10-29 DIAGNOSIS — Z1231 Encounter for screening mammogram for malignant neoplasm of breast: Secondary | ICD-10-CM | POA: Insufficient documentation

## 2019-10-29 LAB — TSH: TSH: 2.36 u[IU]/mL (ref 0.35–4.50)

## 2019-10-29 LAB — CBC WITH DIFFERENTIAL/PLATELET
Basophils Absolute: 0 10*3/uL (ref 0.0–0.1)
Basophils Relative: 0.8 % (ref 0.0–3.0)
Eosinophils Absolute: 0.2 10*3/uL (ref 0.0–0.7)
Eosinophils Relative: 2.9 % (ref 0.0–5.0)
HCT: 40.7 % (ref 36.0–46.0)
Hemoglobin: 13.5 g/dL (ref 12.0–15.0)
Lymphocytes Relative: 36.9 % (ref 12.0–46.0)
Lymphs Abs: 2.1 10*3/uL (ref 0.7–4.0)
MCHC: 33.2 g/dL (ref 30.0–36.0)
MCV: 95 fl (ref 78.0–100.0)
Monocytes Absolute: 0.5 10*3/uL (ref 0.1–1.0)
Monocytes Relative: 8.4 % (ref 3.0–12.0)
Neutro Abs: 2.9 10*3/uL (ref 1.4–7.7)
Neutrophils Relative %: 51 % (ref 43.0–77.0)
Platelets: 278 10*3/uL (ref 150.0–400.0)
RBC: 4.29 Mil/uL (ref 3.87–5.11)
RDW: 13.1 % (ref 11.5–15.5)
WBC: 5.8 10*3/uL (ref 4.0–10.5)

## 2019-10-29 LAB — LIPID PANEL
Cholesterol: 183 mg/dL (ref 0–200)
HDL: 48.6 mg/dL (ref >39.00)
LDL Cholesterol: 110 mg/dL — ABNORMAL HIGH (ref 0–99)
NonHDL: 134.45
Total CHOL/HDL Ratio: 4
Triglycerides: 122 mg/dL (ref 0.0–149.0)
VLDL: 24.4 mg/dL (ref 0.0–40.0)

## 2019-10-29 LAB — COMPREHENSIVE METABOLIC PANEL
ALT: 19 U/L (ref 0–35)
AST: 20 U/L (ref 0–37)
Albumin: 4.3 g/dL (ref 3.5–5.2)
Alkaline Phosphatase: 45 U/L (ref 39–117)
BUN: 13 mg/dL (ref 6–23)
CO2: 28 mEq/L (ref 19–32)
Calcium: 9.1 mg/dL (ref 8.4–10.5)
Chloride: 102 mEq/L (ref 96–112)
Creatinine, Ser: 0.64 mg/dL (ref 0.40–1.20)
GFR: 98.24 mL/min (ref >60.00)
Glucose, Bld: 87 mg/dL (ref 70–99)
Potassium: 3.8 mEq/L (ref 3.5–5.1)
Sodium: 136 mEq/L (ref 135–145)
Total Bilirubin: 0.6 mg/dL (ref 0.2–1.2)
Total Protein: 7 g/dL (ref 6.0–8.3)

## 2019-10-29 NOTE — Progress Notes (Signed)
Subjective:     Cindy Christian is a 49 y.o. female and is here for a comprehensive physical exam. The patient reports no problems.  Social History   Socioeconomic History  . Marital status: Single    Spouse name: Not on file  . Number of children: Not on file  . Years of education: Not on file  . Highest education level: Not on file  Occupational History  . Occupation: admin    Comment: Psychologist, counselling  Tobacco Use  . Smoking status: Former Smoker    Packs/day: 0.50    Years: 13.00    Pack years: 6.50    Quit date: 04/24/1998    Years since quitting: 21.5  . Smokeless tobacco: Never Used  Substance and Sexual Activity  . Alcohol use: Yes    Alcohol/week: 16.0 standard drinks    Types: 4 Glasses of wine, 12 Cans of beer per week  . Drug use: No  . Sexual activity: Yes    Partners: Male  Other Topics Concern  . Not on file  Social History Narrative   Walking daily   Social Determinants of Health   Financial Resource Strain:   . Difficulty of Paying Living Expenses: Not on file  Food Insecurity:   . Worried About Charity fundraiser in the Last Year: Not on file  . Ran Out of Food in the Last Year: Not on file  Transportation Needs:   . Lack of Transportation (Medical): Not on file  . Lack of Transportation (Non-Medical): Not on file  Physical Activity:   . Days of Exercise per Week: Not on file  . Minutes of Exercise per Session: Not on file  Stress:   . Feeling of Stress : Not on file  Social Connections:   . Frequency of Communication with Friends and Family: Not on file  . Frequency of Social Gatherings with Friends and Family: Not on file  . Attends Religious Services: Not on file  . Active Member of Clubs or Organizations: Not on file  . Attends Archivist Meetings: Not on file  . Marital Status: Not on file  Intimate Partner Violence:   . Fear of Current or Ex-Partner: Not on file  . Emotionally Abused: Not on file  . Physically Abused: Not  on file  . Sexually Abused: Not on file   Health Maintenance  Topic Date Due  . HIV Screening  11/14/1984  . TETANUS/TDAP  11/14/1988  . INFLUENZA VACCINE  05/02/2019  . PAP SMEAR-Modifier  08/02/2020    The following portions of the patient's history were reviewed and updated as appropriate:  She  has a past medical history of Borderline glaucoma. She does not have a problem list on file. She  has a past surgical history that includes Knee Arthroplasty (Bilateral, 2000 R 2008 L); Finger fracture surgery (Left, 1983); and LEEP (2004). Her family history includes Diabetes in her maternal uncle; Stroke in her maternal grandmother. She  reports that she quit smoking about 21 years ago. She has a 6.50 pack-year smoking history. She has never used smokeless tobacco. She reports current alcohol use of about 16.0 standard drinks of alcohol per week. She reports that she does not use drugs. She has a current medication list which includes the following prescription(s): vitamin c, b complex vitamins, calcium carb-cholecalciferol, and zinc. Current Outpatient Medications on File Prior to Visit  Medication Sig Dispense Refill  . Ascorbic Acid (VITAMIN C) 500 MG CAPS Take 500 mg by  mouth. Pt reports taking TWO 500 MG caps    . b complex vitamins tablet Take 1 tablet by mouth daily.    . Calcium Carb-Cholecalciferol (CALCIUM + D3 PO) Take by mouth.    . Zinc 50 MG CAPS Take 50 mg by mouth daily.     No current facility-administered medications on file prior to visit.   She is allergic to penicillins..  Review of Systems Review of Systems  Constitutional: Negative for activity change, appetite change and fatigue.  HENT: Negative for hearing loss, congestion, tinnitus and ear discharge.  dentist q58m Eyes: Negative for visual disturbance (see optho q1y -- vision corrected to 20/20 with glasses).  Respiratory: Negative for cough, chest tightness and shortness of breath.   Cardiovascular: Negative  for chest pain, palpitations and leg swelling.  Gastrointestinal: Negative for abdominal pain, diarrhea, constipation and abdominal distention.  Genitourinary: Negative for urgency, frequency, decreased urine volume and difficulty urinating.  Musculoskeletal: Negative for back pain, arthralgias and gait problem.  Skin: Negative for color change, pallor and rash.  Neurological: Negative for dizziness, light-headedness, numbness and headaches.  Hematological: Negative for adenopathy. Does not bruise/bleed easily.  Psychiatric/Behavioral: Negative for suicidal ideas, confusion, sleep disturbance, self-injury, dysphoric mood, decreased concentration and agitation.       Objective:    BP 120/84 (BP Location: Right Arm, Patient Position: Sitting, Cuff Size: Normal)   Pulse 81   Temp (!) 97.4 F (36.3 C) (Temporal)   Resp 18   Ht 5\' 4"  (1.626 m)   Wt 200 lb 9.6 oz (91 kg)   LMP 10/23/2019   SpO2 97%   BMI 34.43 kg/m  General appearance: alert, cooperative, appears stated age and no distress Head: Normocephalic, without obvious abnormality, atraumatic Eyes: conjunctivae/corneas clear. PERRL, EOM's intact. Fundi benign. Ears: normal TM's and external ear canals both ears Nose: Nares normal. Septum midline. Mucosa normal. No drainage or sinus tenderness. Throat: lips, mucosa, and tongue normal; teeth and gums normal Neck: no adenopathy, no carotid bruit, no JVD, supple, symmetrical, trachea midline and thyroid not enlarged, symmetric, no tenderness/mass/nodules Back: symmetric, no curvature. ROM normal. No CVA tenderness. Lungs: clear to auscultation bilaterally Breasts: normal appearance, no masses or tenderness Heart: regular rate and rhythm, S1, S2 normal, no murmur, click, rub or gallop    Assessment:    Healthy female exam.     Plan:    ghm utd Check labs  See After Visit Summary for Counseling Recommendations    1. Preventative health care See above  - Lipid panel - CBC  with Differential/Platelet - TSH - Comprehensive metabolic panel - Cytology - PAP( [facility])  2. Colon cancer screening   - Ambulatory referral to Gastroenterology

## 2019-10-29 NOTE — Patient Instructions (Addendum)
COVID-19 Vaccine Information can be found at: [email or web address] For questions related to vaccine distribution or appointments, please email [email or web address] or call [phone].       Preventive Care 40-50 Years Old, Female Preventive care refers to visits with your health care provider and lifestyle choices that can promote health and wellness. This includes:  A yearly physical exam. This may also be called an annual well check.  Regular dental visits and eye exams.  Immunizations.  Screening for certain conditions.  Healthy lifestyle choices, such as eating a healthy diet, getting regular exercise, not using drugs or products that contain nicotine and tobacco, and limiting alcohol use. What can I expect for my preventive care visit? Physical exam Your health care provider will check your:  Height and weight. This may be used to calculate body mass index (BMI), which tells if you are at a healthy weight.  Heart rate and blood pressure.  Skin for abnormal spots. Counseling Your health care provider may ask you questions about your:  Alcohol, tobacco, and drug use.  Emotional well-being.  Home and relationship well-being.  Sexual activity.  Eating habits.  Work and work environment.  Method of birth control.  Menstrual cycle.  Pregnancy history. What immunizations do I need?  Influenza (flu) vaccine  This is recommended every year. Tetanus, diphtheria, and pertussis (Tdap) vaccine  You may need a Td booster every 10 years. Varicella (chickenpox) vaccine  You may need this if you have not been vaccinated. Zoster (shingles) vaccine  You may need this after age 60. Measles, mumps, and rubella (MMR) vaccine  You may need at least one dose of MMR if you were born in 1957 or later. You may also need a second dose. Pneumococcal conjugate (PCV13) vaccine  You may need this if you have certain  conditions and were not previously vaccinated. Pneumococcal polysaccharide (PPSV23) vaccine  You may need one or two doses if you smoke cigarettes or if you have certain conditions. Meningococcal conjugate (MenACWY) vaccine  You may need this if you have certain conditions. Hepatitis A vaccine  You may need this if you have certain conditions or if you travel or work in places where you may be exposed to hepatitis A. Hepatitis B vaccine  You may need this if you have certain conditions or if you travel or work in places where you may be exposed to hepatitis B. Haemophilus influenzae type b (Hib) vaccine  You may need this if you have certain conditions. Human papillomavirus (HPV) vaccine  If recommended by your health care provider, you may need three doses over 6 months. You may receive vaccines as individual doses or as more than one vaccine together in one shot (combination vaccines). Talk with your health care provider about the risks and benefits of combination vaccines. What tests do I need? Blood tests  Lipid and cholesterol levels. These may be checked every 5 years, or more frequently if you are over 50 years old.  Hepatitis C test.  Hepatitis B test. Screening  Lung cancer screening. You may have this screening every year starting at age 55 if you have a 30-pack-year history of smoking and currently smoke or have quit within the past 15 years.  Colorectal cancer screening. All adults should have this screening starting at age 50 and continuing until age 75. Your health care provider may recommend screening at age 45 if you are at increased risk. You will have tests every 1-10 years, depending on your results and the type   of screening test.  Diabetes screening. This is done by checking your blood sugar (glucose) after you have not eaten for a while (fasting). You may have this done every 1-3 years.  Mammogram. This may be done every 1-2 years. Talk with your health care  provider about when you should start having regular mammograms. This may depend on whether you have a family history of breast cancer.  BRCA-related cancer screening. This may be done if you have a family history of breast, ovarian, tubal, or peritoneal cancers.  Pelvic exam and Pap test. This may be done every 3 years starting at age 21. Starting at age 30, this may be done every 5 years if you have a Pap test in combination with an HPV test. Other tests  Sexually transmitted disease (STD) testing.  Bone density scan. This is done to screen for osteoporosis. You may have this scan if you are at high risk for osteoporosis. Follow these instructions at home: Eating and drinking  Eat a diet that includes fresh fruits and vegetables, whole grains, lean protein, and low-fat dairy.  Take vitamin and mineral supplements as recommended by your health care provider.  Do not drink alcohol if: ? Your health care provider tells you not to drink. ? You are pregnant, may be pregnant, or are planning to become pregnant.  If you drink alcohol: ? Limit how much you have to 0-1 drink a day. ? Be aware of how much alcohol is in your drink. In the [place] one drink equals one 12 oz bottle of beer (355 mL), one 5 oz glass of wine (148 mL), or one 1 oz glass of hard liquor (44 mL). Lifestyle  Take daily care of your teeth and gums.  Stay active. Exercise for at least 30 minutes on 5 or more days each week.  Do not use any products that contain nicotine or tobacco, such as cigarettes, e-cigarettes, and chewing tobacco. If you need help quitting, ask your health care provider.  If you are sexually active, practice safe sex. Use a condom or other form of birth control (contraception) in order to prevent pregnancy and STIs (sexually transmitted infections).  If told by your health care provider, take low-dose aspirin daily starting at age 50. What's next?  Visit your health care provider once a year for a  well check visit.  Ask your health care provider how often you should have your eyes and teeth checked.  Stay up to date on all vaccines. This information is not intended to replace advice given to you by your health care provider. Make sure you discuss any questions you have with your health care provider. Document Revised: 05/29/2018 Document Reviewed: 05/29/2018 Elsevier Patient Education  2020 [employer]  

## 2019-10-30 LAB — CYTOLOGY - PAP
Comment: NEGATIVE
Diagnosis: NEGATIVE
High risk HPV: NEGATIVE

## 2019-12-18 ENCOUNTER — Telehealth: Payer: Self-pay | Admitting: Family Medicine

## 2019-12-18 NOTE — Telephone Encounter (Signed)
Called pt about Mychart request for an appointment .  Pt has SOB CHILLS for the last month. I offered a doxy but patient states she would wait until Monday and see how she do over the weekend

## 2019-12-26 ENCOUNTER — Emergency Department (HOSPITAL_BASED_OUTPATIENT_CLINIC_OR_DEPARTMENT_OTHER): Payer: Self-pay

## 2019-12-26 ENCOUNTER — Inpatient Hospital Stay (HOSPITAL_BASED_OUTPATIENT_CLINIC_OR_DEPARTMENT_OTHER): Payer: Self-pay

## 2019-12-26 ENCOUNTER — Other Ambulatory Visit: Payer: Self-pay

## 2019-12-26 ENCOUNTER — Inpatient Hospital Stay (HOSPITAL_BASED_OUTPATIENT_CLINIC_OR_DEPARTMENT_OTHER)
Admission: EM | Admit: 2019-12-26 | Discharge: 2019-12-31 | DRG: 308 | Disposition: A | Discharge status: Home / Self Care | Payer: Self-pay | Attending: Internal Medicine | Admitting: Internal Medicine

## 2019-12-26 ENCOUNTER — Encounter (HOSPITAL_BASED_OUTPATIENT_CLINIC_OR_DEPARTMENT_OTHER): Payer: Self-pay | Admitting: Emergency Medicine

## 2019-12-26 DIAGNOSIS — E039 Hypothyroidism, unspecified: Secondary | ICD-10-CM | POA: Diagnosis present

## 2019-12-26 DIAGNOSIS — Z87891 Personal history of nicotine dependence: Secondary | ICD-10-CM

## 2019-12-26 DIAGNOSIS — Z6833 Body mass index (BMI) 33.0-33.9, adult: Secondary | ICD-10-CM

## 2019-12-26 DIAGNOSIS — I4891 Unspecified atrial fibrillation: Principal | ICD-10-CM | POA: Diagnosis present

## 2019-12-26 DIAGNOSIS — Z20822 Contact with and (suspected) exposure to covid-19: Secondary | ICD-10-CM | POA: Diagnosis present

## 2019-12-26 DIAGNOSIS — R7989 Other specified abnormal findings of blood chemistry: Secondary | ICD-10-CM

## 2019-12-26 DIAGNOSIS — I5031 Acute diastolic (congestive) heart failure: Secondary | ICD-10-CM | POA: Diagnosis present

## 2019-12-26 DIAGNOSIS — I4819 Other persistent atrial fibrillation: Secondary | ICD-10-CM | POA: Diagnosis present

## 2019-12-26 DIAGNOSIS — Z79899 Other long term (current) drug therapy: Secondary | ICD-10-CM

## 2019-12-26 DIAGNOSIS — Z96653 Presence of artificial knee joint, bilateral: Secondary | ICD-10-CM | POA: Diagnosis present

## 2019-12-26 DIAGNOSIS — Q211 Atrial septal defect: Secondary | ICD-10-CM

## 2019-12-26 DIAGNOSIS — J189 Pneumonia, unspecified organism: Secondary | ICD-10-CM

## 2019-12-26 DIAGNOSIS — R945 Abnormal results of liver function studies: Secondary | ICD-10-CM

## 2019-12-26 DIAGNOSIS — G4733 Obstructive sleep apnea (adult) (pediatric): Secondary | ICD-10-CM | POA: Diagnosis present

## 2019-12-26 DIAGNOSIS — E669 Obesity, unspecified: Secondary | ICD-10-CM | POA: Diagnosis present

## 2019-12-26 DIAGNOSIS — F1010 Alcohol abuse, uncomplicated: Secondary | ICD-10-CM | POA: Diagnosis present

## 2019-12-26 DIAGNOSIS — I4892 Unspecified atrial flutter: Secondary | ICD-10-CM | POA: Diagnosis present

## 2019-12-26 DIAGNOSIS — E876 Hypokalemia: Secondary | ICD-10-CM | POA: Diagnosis present

## 2019-12-26 LAB — COMPREHENSIVE METABOLIC PANEL
ALT: 58 U/L — ABNORMAL HIGH (ref 0–44)
AST: 63 U/L — ABNORMAL HIGH (ref 15–41)
Albumin: 3.9 g/dL (ref 3.5–5.0)
Alkaline Phosphatase: 52 U/L (ref 38–126)
Anion gap: 11 (ref 5–15)
BUN: 15 mg/dL (ref 6–20)
CO2: 20 mmol/L — ABNORMAL LOW (ref 22–32)
Calcium: 9.3 mg/dL (ref 8.9–10.3)
Chloride: 106 mmol/L (ref 98–111)
Creatinine, Ser: 0.9 mg/dL (ref 0.44–1.00)
GFR calc Af Amer: >60 mL/min (ref >60)
GFR calc non Af Amer: >60 mL/min (ref >60)
Glucose, Bld: 132 mg/dL — ABNORMAL HIGH (ref 70–99)
Potassium: 3.6 mmol/L (ref 3.5–5.1)
Sodium: 137 mmol/L (ref 135–145)
Total Bilirubin: 0.9 mg/dL (ref 0.3–1.2)
Total Protein: 7.2 g/dL (ref 6.5–8.1)

## 2019-12-26 LAB — BRAIN NATRIURETIC PEPTIDE: B Natriuretic Peptide: 179.7 pg/mL — ABNORMAL HIGH (ref 0.0–100.0)

## 2019-12-26 LAB — TROPONIN I (HIGH SENSITIVITY)
Troponin I (High Sensitivity): 7 ng/L (ref <18)
Troponin I (High Sensitivity): 7 ng/L (ref <18)

## 2019-12-26 LAB — T4, FREE: Free T4: 1.11 ng/dL (ref 0.61–1.12)

## 2019-12-26 LAB — URINALYSIS, ROUTINE W REFLEX MICROSCOPIC
Bilirubin Urine: NEGATIVE
Glucose, UA: NEGATIVE mg/dL
Hgb urine dipstick: NEGATIVE
Ketones, ur: NEGATIVE mg/dL
Leukocytes,Ua: NEGATIVE
Nitrite: NEGATIVE
Protein, ur: NEGATIVE mg/dL
Specific Gravity, Urine: 1.01 (ref 1.005–1.030)
pH: 6 (ref 5.0–8.0)

## 2019-12-26 LAB — D-DIMER, QUANTITATIVE: D-Dimer, Quant: 1.19 ug/mL-FEU — ABNORMAL HIGH (ref 0.00–0.50)

## 2019-12-26 LAB — PREGNANCY, URINE: Preg Test, Ur: NEGATIVE

## 2019-12-26 LAB — TSH: TSH: 5.209 u[IU]/mL — ABNORMAL HIGH (ref 0.350–4.500)

## 2019-12-26 MED ORDER — SODIUM CHLORIDE 0.9 % IV SOLN
1.0000 g | Freq: Once | INTRAVENOUS | Status: AC
  Administered 2019-12-26: 1 g via INTRAVENOUS
  Filled 2019-12-26: qty 10

## 2019-12-26 MED ORDER — SODIUM CHLORIDE 0.9 % IV SOLN
500.0000 mg | Freq: Once | INTRAVENOUS | Status: AC
  Administered 2019-12-27: 500 mg via INTRAVENOUS
  Filled 2019-12-26: qty 500

## 2019-12-26 MED ORDER — METOPROLOL TARTRATE 5 MG/5ML IV SOLN
5.0000 mg | Freq: Once | INTRAVENOUS | Status: DC
  Filled 2019-12-26: qty 5

## 2019-12-26 MED ORDER — DILTIAZEM HCL 25 MG/5ML IV SOLN
20.0000 mg | Freq: Once | INTRAVENOUS | Status: AC
  Administered 2019-12-26: 20 mg via INTRAVENOUS

## 2019-12-26 MED ORDER — SODIUM CHLORIDE 0.9 % IV BOLUS
500.0000 mL | Freq: Once | INTRAVENOUS | Status: AC
  Administered 2019-12-26: 500 mL via INTRAVENOUS

## 2019-12-26 MED ORDER — SODIUM CHLORIDE 0.9 % IV BOLUS
500.0000 mL | Freq: Once | INTRAVENOUS | Status: AC
  Administered 2019-12-27: 500 mL via INTRAVENOUS

## 2019-12-26 MED ORDER — DILTIAZEM HCL-DEXTROSE 125-5 MG/125ML-% IV SOLN (PREMIX)
5.0000 mg/h | INTRAVENOUS | Status: AC
  Administered 2019-12-27: 7.5 mg/h via INTRAVENOUS
  Administered 2019-12-27 – 2019-12-28 (×2): 15 mg/h via INTRAVENOUS
  Filled 2019-12-26 (×10): qty 125

## 2019-12-26 MED ORDER — DILTIAZEM HCL-DEXTROSE 125-5 MG/125ML-% IV SOLN (PREMIX)
INTRAVENOUS | Status: AC
  Filled 2019-12-26: qty 125

## 2019-12-26 MED ORDER — DILTIAZEM HCL-DEXTROSE 125-5 MG/125ML-% IV SOLN (PREMIX)
5.0000 mg/h | INTRAVENOUS | Status: DC
  Administered 2019-12-26: 5 mg/h via INTRAVENOUS

## 2019-12-26 MED ORDER — DILTIAZEM HCL 25 MG/5ML IV SOLN
INTRAVENOUS | Status: AC
  Filled 2019-12-26: qty 5

## 2019-12-26 MED ORDER — IOHEXOL 300 MG/ML  SOLN
100.0000 mL | Freq: Once | INTRAMUSCULAR | Status: AC | PRN
  Administered 2019-12-26: 100 mL via INTRAVENOUS

## 2019-12-26 MED ORDER — LORAZEPAM 2 MG/ML IJ SOLN
0.5000 mg | Freq: Once | INTRAMUSCULAR | Status: AC
  Administered 2019-12-27: 0.5 mg via INTRAVENOUS
  Filled 2019-12-26: qty 1

## 2019-12-26 MED ORDER — IOHEXOL 350 MG/ML SOLN
80.0000 mL | Freq: Once | INTRAVENOUS | Status: AC | PRN
  Administered 2019-12-26: 80 mL via INTRAVENOUS

## 2019-12-26 NOTE — ED Provider Notes (Signed)
[facility] EMERGENCY DEPARTMENT Provider Note   CSN: [id number] Arrival date & time: 12/26/19  1925     History Chief Complaint  Patient presents with  . Shortness of Breath    Cindy Christian is a 49 y.o. female.  About thePatient is a 49 year old female with no significant past medical history who presents with shortness of breath.  She states that she has been feeling short of breath for last 5 weeks.  She says that she has been feeling like her abdomen has been bloated and she has a bandlike feeling across her upper abdomen.  She has early satiety and has not really been able to eat or drink much because she gets full so fast.  She has been feeling short of breath which she thinks is due to her swollen abdomen but she is not sure.  She gets short of breath with exertion and also short of breath when she lies flat.  She has had to sleep over the last 5 weeks with pillows or sitting upright.  She denies any cough.  No pleuritic symptoms.  She feels pressure in her abdomen when she tries to take a deep breath.  She feels like she has been anxious frequently but does not really note that her hearts been racing.  She says occasionally she feels like her heart may be beating fast but she attributes it to anxiety.  She denies any swelling of her legs.  No significant caffeine intake.  She is not taking any over-the-counter supplements or diet pills.  She denies any issues with her thyroid.  She had a TSH checked in January which was normal.  She has not been having good bowel movements but she says she also has not really been eating or drinking much.  She has not taken anything for constipation other than an herbal tea.        Past Medical History:  Diagnosis Date  . Borderline glaucoma    popa    Patient Active Problem List   Diagnosis Date Noted  . Atrial fibrillation with RVR ([place]) 12/26/2019    Past Surgical History:  Procedure Laterality Date  . FINGER FRACTURE SURGERY  Left 1983   middle finger  . KNEE ARTHROPLASTY Bilateral 2000 R 2008 L  . LEEP  2004   CIN 1-2     OB History   No obstetric history on file.     Family History  Problem Relation Age of Onset  . Diabetes Maternal Uncle        Type 1  . Stroke Maternal Grandmother     Social History   Tobacco Use  . Smoking status: Former Smoker    Packs/day: 0.50    Years: 13.00    Pack years: 6.50    Quit date: 04/24/1998    Years since quitting: 21.6  . Smokeless tobacco: Never Used  Substance Use Topics  . Alcohol use: Yes    Alcohol/week: 16.0 standard drinks    Types: 4 Glasses of wine, 12 Cans of beer per week  . Drug use: No    Home Medications Prior to Admission medications   Medication Sig Start Date End Date Taking? Authorizing Provider  Ascorbic Acid (VITAMIN C) 500 MG CAPS Take 500 mg by mouth. Pt reports taking TWO 500 MG caps    [provider]  b complex vitamins tablet Take 1 tablet by mouth daily.    [provider]  Calcium Carb-Cholecalciferol (CALCIUM + D3 PO)  Take by mouth.    [provider]  Zinc 50 MG CAPS Take 50 mg by mouth daily.    [provider]    Allergies    Penicillins  Review of Systems   Review of Systems  Constitutional: Positive for appetite change and fatigue. Negative for chills, diaphoresis and fever.  HENT: Negative for congestion, rhinorrhea and sneezing.   Eyes: Negative.   Respiratory: Positive for shortness of breath. Negative for cough and chest tightness.   Cardiovascular: Negative for chest pain and leg swelling.  Gastrointestinal: Positive for abdominal distention, abdominal pain and constipation. Negative for blood in stool, diarrhea, nausea and vomiting.  Genitourinary: Negative for difficulty urinating, flank pain, frequency and hematuria.  Musculoskeletal: Negative for arthralgias and back pain.  Skin: Negative for rash.  Neurological: Negative for dizziness, speech difficulty, weakness,  numbness and headaches.  Psychiatric/Behavioral: Positive for agitation.    Physical Exam Updated Vital Signs BP 105/83 (BP Location: Right Arm)   Pulse (!) 44   Temp 98.4 F (36.9 C) (Oral)   Resp (!) 22   Ht 5\' 4"  (1.626 m)   Wt 88.5 kg   LMP 12/06/2019   SpO2 95%   BMI 33.47 kg/m   Physical Exam Constitutional:      Appearance: She is well-developed.  HENT:     Head: Normocephalic and atraumatic.  Eyes:     Pupils: Pupils are equal, round, and reactive to light.     Comments: She has the appearance of exophthalmos  Cardiovascular:     Rate and Rhythm: Regular rhythm. Tachycardia present.     Heart sounds: Normal heart sounds.  Pulmonary:     Effort: Pulmonary effort is normal. Tachypnea present. No respiratory distress.     Breath sounds: Normal breath sounds. No wheezing or rales.  Chest:     Chest wall: No tenderness.  Abdominal:     General: Bowel sounds are normal.     Palpations: Abdomen is soft.     Tenderness: There is no abdominal tenderness. There is no guarding or rebound.  Musculoskeletal:        General: Normal range of motion.     Cervical back: Normal range of motion and neck supple.     Comments: No swelling or calf tenderness  Lymphadenopathy:     Cervical: No cervical adenopathy.  Skin:    General: Skin is warm and dry.     Findings: No rash.  Neurological:     Mental Status: She is alert and oriented to person, place, and time.     ED Results / Procedures / Treatments   Labs (all labs ordered are listed, but only abnormal results are displayed) Labs Reviewed  COMPREHENSIVE METABOLIC PANEL - Abnormal; Notable for the following components:      Result Value   CO2 20 (*)    Glucose, Bld 132 (*)    AST 63 (*)    ALT 58 (*)    All other components within normal limits  BRAIN NATRIURETIC PEPTIDE - Abnormal; Notable for the following components:   B Natriuretic Peptide 179.7 (*)    All other components within normal limits  TSH -  Abnormal; Notable for the following components:   TSH 5.209 (*)    All other components within normal limits  D-DIMER, QUANTITATIVE (NOT AT [facility]) - Abnormal; Notable for the following components:   D-Dimer, Quant 1.19 (*)    All other components within normal limits  SARS CORONAVIRUS 2 (TAT  6-24 HRS)  URINALYSIS, ROUTINE W REFLEX MICROSCOPIC  PREGNANCY, URINE  T4, FREE  T3, FREE  TROPONIN I (HIGH SENSITIVITY)  TROPONIN I (HIGH SENSITIVITY)    EKG EKG Interpretation  Date/Time:  Saturday December 26 2019 19:48:21 EDT Ventricular Rate:  189 PR Interval:    QRS Duration: 78 QT Interval:  249 QTC Calculation: 442 R Axis:   123 Text Interpretation: Atrial fibrillation with rapid V-rate Ventricular premature complex Anterior infarct, old Repolarization abnormality, prob rate related Baseline wander in lead(s) II No old tracing to compare Confirmed by Malvin Johns [phone]) on 12/26/2019 7:50:56 PM   Radiology CT Abdomen Pelvis W Contrast  Result Date: 12/26/2019 CLINICAL DATA:  Shortness of breath and abdominal distension EXAM: CT ABDOMEN AND PELVIS WITH CONTRAST TECHNIQUE: Multidetector CT imaging of the abdomen and pelvis was performed using the standard protocol following bolus administration of intravenous contrast. CONTRAST:  159mL OMNIPAQUE IOHEXOL 300 MG/ML  SOLN COMPARISON:  None. FINDINGS: Lower chest: The visualized heart size within normal limits. No pericardial fluid/thickening. There is small bilateral pleural effusions, right greater than left. Patchy airspace opacities are seen at both lung bases, right greater than left. Hepatobiliary: The liver is normal in density without focal abnormality.The main portal vein is patent. There appears to be a small amount of pericholecystic fluid present. No definite layering cough calcified gallstones are seen. There is question of mild fat stranding changes seen. No intrahepatic biliary ductal dilatation is seen. Pancreas: Unremarkable. No  pancreatic ductal dilatation or surrounding inflammatory changes. Spleen: Normal in size without focal abnormality. Adrenals/Urinary Tract: Both adrenal glands appear normal. The kidneys and collecting system appear normal without evidence of urinary tract calculus or hydronephrosis. Bladder is unremarkable. Stomach/Bowel: The stomach, small bowel, and colon are normal in appearance. No inflammatory changes, wall thickening, or obstructive findings. Vascular/Lymphatic: There are no enlarged mesenteric, retroperitoneal, or pelvic lymph nodes. No significant vascular findings are present. Reproductive: The uterus and adnexa are unremarkable. A small amount of physiologic free fluid in the cul-de-sac. Other: Small amount of inflammatory changes seen tracking into the right lower quadrant. Musculoskeletal: No acute or significant osseous findings. IMPRESSION: 1. Small bilateral pleural effusions, right greater than left. 2. Patchy airspace opacities at both lung bases which could be due to atelectasis or infectious etiology. 3. Small amount of pericholecystic fluid and question of mild fat stranding changes around the gallbladder. However no definite calcified gallstones are seen. If further evaluation is required would recommend right upper quadrant ultrasound. Electronically Signed   By: Prudencio Pair M.D.   On: 12/26/2019 22:40   DG Chest Port 1 View  Result Date: 12/26/2019 CLINICAL DATA:  Shortness of breath EXAM: PORTABLE CHEST 1 VIEW COMPARISON:  May 07, 2007 FINDINGS: The heart size and mediastinal contours are within normal limits. There is hazy airspace opacity seen at the right lung base. No pleural effusion is seen. No acute osseous abnormality. IMPRESSION: Hazy airspace opacity at the right lung base which could be due to atelectasis or infectious etiology. Electronically Signed   By: Prudencio Pair M.D.   On: 12/26/2019 20:42    Procedures Procedures (including critical care time)  Medications  Ordered in ED Medications  dilTIAZem HCl-Dextrose 125-5 MG/125ML-% infusion (has no administration in time range)  cefTRIAXone (ROCEPHIN) 1 g in sodium chloride 0.9 % 100 mL IVPB (has no administration in time range)  azithromycin (ZITHROMAX) 500 mg in sodium chloride 0.9 % 250 mL IVPB (has no administration in time range)  LORazepam (ATIVAN)  injection 0.5 mg (has no administration in time range)  diltiazem (CARDIZEM) 125 mg in dextrose 5% 125 mL (1 mg/mL) infusion (has no administration in time range)  diltiazem (CARDIZEM) injection 20 mg (20 mg Intravenous Given 12/26/19 2006)  iohexol (OMNIPAQUE) 300 MG/ML solution 100 mL (100 mLs Intravenous Contrast Given 12/26/19 2220)  sodium chloride 0.9 % bolus 500 mL (500 mLs Intravenous New Bag/Given 12/26/19 2244)    ED Course  I have reviewed the triage vital signs and the nursing notes.  Pertinent labs & imaging results that were available during my care of the patient were reviewed by me and considered in my medical decision making (see chart for details).    MDM Rules/Calculators/A&P                      Patient is a 49 year old female who presents with a 5-week history of fatigue And abdominal bloating/decreased appetite.  She was noted to be in A. fib with RVR on arrival with heart rates in the 200s.  She was given a bolus of Cardizem and started on a Cardizem drip.  Her heart rate improved to the 140s/150s.  The Cardizem drip is being titrated to improve her heart rate.  Initially I was concerned about possible thyroid storm.  She seems to have possible exophthalmos on exam.  However her TSH was mildly elevated and her T4 is normal.  This is not consistent with thyrotoxicosis.  Her chest x-ray shows some bibasilar atelectasis versus pneumonia.  She was treated presumptively for community-acquired pneumonia with Rocephin and Zithromax.  She has mild elevation in her BNP but no pulmonary edema on x-ray.  She has no hypoxia or pleuritic type chest  pain.  Given her abdominal bloating and early satiety, CT scan was performed of her abdomen and pelvis.  This showed some mild pericholecystic fluid but no gallstones or other overt signs of cholecystitis.  She may need a gallbladder ultrasound but at this time we do not have an ultrasound tech here to perform this.  She has mild elevation in her LFTs.  She also has a mildly elevated D-dimer.  CT angio of her chest is pending.  I spoke with Dr. Cyd Silence who has accepted the patient for admission to [facility].  Dr. Florina Ou to follow up on the CT chest while pt is still in the department.  Patient likely will not get a bed at [facility] until tomorrow.   CRITICAL CARE Performed by: Malvin Johns Total critical care time: 60 minutes Critical care time was exclusive of separately billable procedures and treating other patients. Critical care was necessary to treat or prevent imminent or life-threatening deterioration. Critical care was time spent personally by me on the following activities: development of treatment plan with patient and/or surrogate as well as nursing, discussions with consultants, evaluation of patient's response to treatment, examination of patient, obtaining history from patient or surrogate, ordering and performing treatments and interventions, ordering and review of laboratory studies, ordering and review of radiographic studies, pulse oximetry and re-evaluation of patient's condition.  Final Clinical Impression(s) / ED Diagnoses Final diagnoses:  Atrial fibrillation with RVR ([place])  Community acquired pneumonia, unspecified laterality    Rx / DC Orders ED Discharge Orders    None       Malvin Johns, MD 12/26/19 2340

## 2019-12-26 NOTE — ED Triage Notes (Signed)
Reports SHOB x 5 weeks, states it is constant but made worse by activity and laying flat. Also reports difficulty with BMs and abdominal distention x 5 weeks.

## 2019-12-26 NOTE — ED Triage Notes (Signed)
EDP notified of pts HR and rhythm. EDP at bedside.

## 2019-12-26 NOTE — Plan of Care (Addendum)
HOSPITAL MEDICINE PLAN OF CARE  49 year old female with no past medical history presenting with 5-week history of progressively worsening malaise, weakness and dyspnea with exertion upon evaluation in the emergency department, patient was found to be in rapid atrial fibrillation with heart rates initially over 200 bpm.  Troponin unremarkable, TSH only slightly elevated at 5.2 and unlikely to be the cause.  BNP somewhat elevated at 179.7.  DDimer noted to be markedly elevated at 1.19 and CT angiogram of the chest has been ordered and is pending to evaluate for pulmonary embolism as a potential etiology.  Concerning rate control, patient has been initiated on diltiazem 20 mg IV bolus followed by diltiazem infusion, this is being titrated upwards as blood pressure tolerates to achieve rate control.  Only, patient is still exhibiting rapid ventricular rate with heart rate currently 140 bpm.  I accepted the patient to a stepdown bed for continued titration of AV nodal blocking agents.  Patient will additionally require the and cardiology consultation. CHADSVASC score is 1.    Cindy Christian

## 2019-12-27 DIAGNOSIS — J90 Pleural effusion, not elsewhere classified: Secondary | ICD-10-CM

## 2019-12-27 DIAGNOSIS — Z823 Family history of stroke: Secondary | ICD-10-CM

## 2019-12-27 DIAGNOSIS — Z833 Family history of diabetes mellitus: Secondary | ICD-10-CM

## 2019-12-27 DIAGNOSIS — R7301 Impaired fasting glucose: Secondary | ICD-10-CM

## 2019-12-27 DIAGNOSIS — Z87891 Personal history of nicotine dependence: Secondary | ICD-10-CM

## 2019-12-27 DIAGNOSIS — I482 Chronic atrial fibrillation, unspecified: Secondary | ICD-10-CM

## 2019-12-27 DIAGNOSIS — I4819 Other persistent atrial fibrillation: Secondary | ICD-10-CM | POA: Diagnosis present

## 2019-12-27 DIAGNOSIS — R945 Abnormal results of liver function studies: Secondary | ICD-10-CM

## 2019-12-27 LAB — MRSA PCR SCREENING: MRSA by PCR: NEGATIVE

## 2019-12-27 LAB — LIPASE, BLOOD: Lipase: 29 U/L (ref 11–51)

## 2019-12-27 LAB — HEPARIN LEVEL (UNFRACTIONATED)
Heparin Unfractionated: 0.55 IU/mL (ref 0.30–0.70)
Heparin Unfractionated: 0.48 IU/mL (ref 0.30–0.70)

## 2019-12-27 LAB — HIV ANTIBODY (ROUTINE TESTING W REFLEX): HIV Screen 4th Generation wRfx: NONREACTIVE

## 2019-12-27 LAB — SARS CORONAVIRUS 2 (TAT 6-24 HRS): SARS Coronavirus 2: NEGATIVE

## 2019-12-27 MED ORDER — ZINC SULFATE 220 (50 ZN) MG PO CAPS
220.0000 mg | ORAL_CAPSULE | Freq: Every day | ORAL | Status: DC
  Filled 2019-12-27: qty 1

## 2019-12-27 MED ORDER — SODIUM CHLORIDE 0.9 % IV SOLN: 250.0000 mL | INTRAVENOUS | Status: DC | PRN

## 2019-12-27 MED ORDER — SODIUM CHLORIDE 0.9% FLUSH: 3.0000 mL | INTRAVENOUS | Status: DC | PRN

## 2019-12-27 MED ORDER — FUROSEMIDE 10 MG/ML IJ SOLN: 40.0000 mg | INTRAMUSCULAR | Status: DC

## 2019-12-27 MED ORDER — ASCORBIC ACID 500 MG PO TABS
500.0000 mg | ORAL_TABLET | Freq: Every day | ORAL | Status: DC
  Filled 2019-12-27: qty 1

## 2019-12-27 MED ORDER — B COMPLEX-C PO TABS
1.0000 | ORAL_TABLET | Freq: Every day | ORAL | Status: DC
  Filled 2019-12-27 (×2): qty 1

## 2019-12-27 MED ORDER — HEPARIN (PORCINE) 25000 UT/250ML-% IV SOLN
1100.0000 [IU]/h | INTRAVENOUS | Status: DC
  Administered 2019-12-27: 1100 [IU]/h via INTRAVENOUS
  Filled 2019-12-27 (×3): qty 250

## 2019-12-27 MED ORDER — HEPARIN BOLUS VIA INFUSION
4450.0000 [IU] | Freq: Once | INTRAVENOUS | Status: AC
  Administered 2019-12-27: 4450 [IU] via INTRAVENOUS

## 2019-12-27 MED ORDER — CALCIUM CARBONATE-VITAMIN D 500-200 MG-UNIT PO TABS
1.0000 | ORAL_TABLET | Freq: Every day | ORAL | Status: DC
  Filled 2019-12-27: qty 1

## 2019-12-27 MED ORDER — FUROSEMIDE 10 MG/ML IJ SOLN
60.0000 mg | INTRAMUSCULAR | Status: AC
  Administered 2019-12-27: 60 mg via INTRAVENOUS
  Filled 2019-12-27: qty 6

## 2019-12-27 MED ORDER — ACETAMINOPHEN 325 MG PO TABS: 650.0000 mg | ORAL_TABLET | ORAL | Status: DC | PRN

## 2019-12-27 MED ORDER — SODIUM CHLORIDE 0.9% FLUSH
3.0000 mL | Freq: Two times a day (BID) | INTRAVENOUS | Status: DC
  Administered 2019-12-28 – 2019-12-31 (×5): 3 mL via INTRAVENOUS

## 2019-12-27 MED ORDER — ONDANSETRON HCL 4 MG/2ML IJ SOLN: 4.0000 mg | Freq: Four times a day (QID) | INTRAMUSCULAR | Status: DC | PRN

## 2019-12-27 NOTE — Progress Notes (Signed)
Alpha for Heparin Indication: atrial fibrillation CHADSVAS score of 1  Allergies  Allergen Reactions  . Penicillins Other (See Comments)    Childhood allergy Did it involve swelling of the face/tongue/throat, SOB, or low BP? Unknown Did it involve sudden or severe rash/hives, skin peeling, or any reaction on the inside of your mouth or nose? Unknown Did you need to seek medical attention at a hospital or doctor's office? Unknown When did it last happen?young child If all above answers are "NO", may proceed with cephalosporin use.    Patient Measurements: Height: 5\' 4"  (162.6 cm) Weight: 194 lb 9.6 oz (88.3 kg) IBW/kg (Calculated) : 54.7 Heparin Dosing Weight: 74.4 kg  Vital Signs: Temp: 97.6 F (36.4 C) (03/28 2002) Temp Source: Oral (03/28 2002) BP: 133/100 (03/28 2002) Pulse Rate: 110 (03/28 2002)  Labs: Recent Labs    12/26/19 1957 12/26/19 2240 12/27/19 1550 12/27/19 2205  HEPARINUNFRC  --   --  0.55 0.48  CREATININE 0.90  --   --   --   TROPONINIHS 7 7  --   --     Estimated Creatinine Clearance: 80.4 mL/min (by C-G formula based on SCr of 0.9 mg/dL).   Medical History: Past Medical History:  Diagnosis Date  . Borderline glaucoma    popa    Medications:  Scheduled:  . [START ON 12/28/2019] ascorbic acid  500 mg Oral Daily  . [START ON 12/28/2019] B-complex with vitamin C  1 tablet Oral Daily  . [START ON 12/28/2019] calcium-vitamin D  1 tablet Oral Daily  . sodium chloride flush  3 mL Intravenous Q12H  . [START ON 12/28/2019] zinc sulfate  220 mg Oral Daily    Assessment: Patient is a 21 yof that presents with worsening malaise. Patient was found to be in Afib with RVR. Pharmacy has been asked to dose heparin in this patient for Afib.  Heparin level this afternoon within goal range.  No overt bleeding or complications noted.  3/28 PM update:  Heparin level therapeutic x 2  Goal of Therapy:  Heparin  level 0.3-0.7 units/ml Monitor platelets by anticoagulation protocol: Yes   Plan:  -Cont heparin at 1100 units/hr -Daily CBC/HL -Monitor for bleeding -F/U plans for oral anti-coagulation   Narda Bonds, PharmD, BCPS Clinical Pharmacist Phone: [phone]

## 2019-12-27 NOTE — H&P (Addendum)
History and Physical    Cindy Christian [id number] DOB: [birth year] DOA: 12/26/2019  PCP: Cindy Held, DO (Confirm with patient/family/NH records and if not entered, this has to be entered at [facility] point of entry) Patient coming from: Home  I have personally briefly reviewed patient's old medical records in [place]  Chief Complaint: SOB, palpitation  HPI: Cindy Christian is a 49 y.o. female with no significant past medical history presented with new onset of shortness of breath 5 weeks.  Her symptoms started as feeling of abd bloating and surrounded with bandlike feeling across her upper abdomen.  Also, she has had early satiety and has not really been able to eat or drink much because she gets full so fast. Before all these happened she had multiple episodes of feeling of "butterfly in chest" type of feeling but usually resolved in few hours. She did not pay much attention to it since she reasoned that it was one of her pre-menopausal symptoms.  She denied any chest pain, but started to have dry cough last night no fever or chills, no known COVID contact. No N/V no diarrhea.  ED Course: Rapid a-fib, cardio informed and cardizem drip and heparin drip started.  Review of Systems: As per HPI otherwise 10 point review of systems negative.    Past Medical History:  Diagnosis Date  . Borderline glaucoma    popa    Past Surgical History:  Procedure Laterality Date  . FINGER FRACTURE SURGERY Left 1983   middle finger  . KNEE ARTHROPLASTY Bilateral 2000 R 2008 L  . LEEP  2004   CIN 1-2     reports that she quit smoking about 21 years ago. She has a 6.50 pack-year smoking history. She has never used smokeless tobacco. She reports current alcohol use of about 16.0 standard drinks of alcohol per week. She reports that she does not use drugs.  Allergies  Allergen Reactions  . Penicillins Other (See Comments)    Childhood allergy    Family History  Problem Relation Age of  Onset  . Diabetes Maternal Uncle        Type 1  . Stroke Maternal Grandmother      Prior to Admission medications   Medication Sig Start Date End Date Taking? Authorizing Provider  Ascorbic Acid (VITAMIN C) 500 MG CAPS Take 500 mg by mouth. Pt reports taking TWO 500 MG caps    [provider]  b complex vitamins tablet Take 1 tablet by mouth daily.    [provider]  Calcium Carb-Cholecalciferol (CALCIUM + D3 PO) Take by mouth.    [provider]  Zinc 50 MG CAPS Take 50 mg by mouth daily.    [provider]    Physical Exam: Vitals:   12/27/19 1540 12/27/19 1554 12/27/19 1557 12/27/19 1818  BP: 108/77  110/88   Pulse: 100 (!) 104    Resp: (!) 25 19 (!) 21   Temp:      TempSrc:      SpO2: 94% 100%    Weight:    88.3 kg  Height:    5\' 4"  (1.626 m)    Constitutional: NAD, calm, comfortable Vitals:   12/27/19 1540 12/27/19 1554 12/27/19 1557 12/27/19 1818  BP: 108/77  110/88   Pulse: 100 (!) 104    Resp: (!) 25 19 (!) 21   Temp:      TempSrc:      SpO2: 94% 100%  Weight:    88.3 kg  Height:    5\' 4"  (1.626 m)   Eyes: PERRL, lids and conjunctivae normal ENMT: Mucous membranes are moist. Posterior pharynx clear of any exudate or lesions.Normal dentition.  Neck: normal, supple, no masses, no thyromegaly Respiratory: clear to auscultation bilaterally, no wheezing, no crackles. Normal respiratory effort. No accessory muscle use.  Cardiovascular: Irregular rate, no murmurs / rubs / gallops. No extremity edema. 2+ pedal pulses. No carotid bruits.  Abdomen: no tenderness, no masses palpated. No hepatosplenomegaly. Bowel sounds positive.  Musculoskeletal: no clubbing / cyanosis. No joint deformity upper and lower extremities. Good ROM, no contractures. Normal muscle tone.  Skin: no rashes, lesions, ulcers. No induration Neurologic: CN 2-12 grossly intact. Sensation intact, DTR normal. Strength 5/5 in all 4.  Psychiatric: Normal judgment and  insight. Alert and oriented x 3. Normal mood.    Labs on Admission: I have personally reviewed following labs and imaging studies  CBC: No results for input(s): WBC, NEUTROABS, HGB, HCT, MCV, PLT in the last 168 hours. Basic Metabolic Panel: Recent Labs  Lab 12/26/19 1957  NA 137  K 3.6  CL 106  CO2 20*  GLUCOSE 132*  BUN 15  CREATININE 0.90  CALCIUM 9.3   GFR: Estimated Creatinine Clearance: 80.4 mL/min (by C-G formula based on SCr of 0.9 mg/dL). Liver Function Tests: Recent Labs  Lab 12/26/19 1957  AST 63*  ALT 58*  ALKPHOS 52  BILITOT 0.9  PROT 7.2  ALBUMIN 3.9   Recent Labs  Lab 12/26/19 2248  LIPASE 29   No results for input(s): AMMONIA in the last 168 hours. Coagulation Profile: No results for input(s): INR, PROTIME in the last 168 hours. Cardiac Enzymes: No results for input(s): CKTOTAL, CKMB, CKMBINDEX, TROPONINI in the last 168 hours. BNP (last 3 results) No results for input(s): PROBNP in the last 8760 hours. HbA1C: No results for input(s): HGBA1C in the last 72 hours. CBG: No results for input(s): GLUCAP in the last 168 hours. Lipid Profile: No results for input(s): CHOL, HDL, LDLCALC, TRIG, CHOLHDL, LDLDIRECT in the last 72 hours. Thyroid Function Tests: Recent Labs    12/26/19 2026  TSH 5.209*  FREET4 1.11   Anemia Panel: No results for input(s): VITAMINB12, FOLATE, FERRITIN, TIBC, IRON, RETICCTPCT in the last 72 hours. Urine analysis:    Component Value Date/Time   COLORURINE YELLOW 12/26/2019 2130   APPEARANCEUR CLEAR 12/26/2019 2130   LABSPEC 1.010 12/26/2019 2130   PHURINE 6.0 12/26/2019 2130   GLUCOSEU NEGATIVE 12/26/2019 2130   HGBUR NEGATIVE 12/26/2019 2130   BILIRUBINUR NEGATIVE 12/26/2019 2130   BILIRUBINUR neg 08/02/2017 1057   KETONESUR NEGATIVE 12/26/2019 2130   PROTEINUR NEGATIVE 12/26/2019 2130   UROBILINOGEN 0.2 08/02/2017 1057   NITRITE NEGATIVE 12/26/2019 2130   LEUKOCYTESUR NEGATIVE 12/26/2019 2130     Radiological Exams on Admission: CT Angio Chest PE W/Cm &/Or Wo Cm  Result Date: 12/27/2019 CLINICAL DATA:  PE suspected, low/intermediate prob, positive D-dimer Shortness of breath for 5 weeks, worse with activity and lying flat. EXAM: CT ANGIOGRAPHY CHEST WITH CONTRAST TECHNIQUE: Multidetector CT imaging of the chest was performed using the standard protocol during bolus administration of intravenous contrast. Multiplanar CT image reconstructions and MIPs were obtained to evaluate the vascular anatomy. CONTRAST:  50mL OMNIPAQUE IOHEXOL 350 MG/ML SOLN COMPARISON:  Lung bases from abdominal CT earlier today. Chest radiograph earlier today. FINDINGS: Cardiovascular: There are no filling defects within the pulmonary arteries to suggest pulmonary embolus. Upper normal heart  size. Thoracic aorta is normal in caliber. Mild aortic atherosclerosis. No evidence of aortic dissection. No pericardial effusion. Mediastinum/Nodes: Small mediastinal and hilar lymph nodes are not enlarged by size criteria. No esophageal wall thickening. No visualized pulmonary nodule. Lungs/Pleura: Small to moderate bilateral pleural effusions. Adjacent atelectasis in both lower lobes. Basilar septal thickening. Peribronchial thickening which may be congestive. Scattered nodular ground-glass opacities in the mid lower lung zones, series 5 images 53, 55, and image 70. Upper Abdomen: Assessed on abdominal CT earlier today. No interval change. Musculoskeletal: There are no acute or suspicious osseous abnormalities. Review of the MIP images confirms the above findings. IMPRESSION: 1. No pulmonary embolus. 2. Small to moderate bilateral pleural effusions, adjacent atelectasis. Basilar septal thickening suspicious for pulmonary edema. Bronchial thickening, suspect this is congestive. 3. Nodular ground-glass opacities in the bilateral mid lower lung zones, nonspecific for pulmonary edema versus infectious or inflammatory etiology. COVID  pneumonia can produce a similar appearance, though this is slightly atypical. Aortic Atherosclerosis (ICD10-I70.0). Electronically Signed   By: Keith Rake M.D.   On: 12/27/2019 00:11   CT Abdomen Pelvis W Contrast  Result Date: 12/26/2019 CLINICAL DATA:  Shortness of breath and abdominal distension EXAM: CT ABDOMEN AND PELVIS WITH CONTRAST TECHNIQUE: Multidetector CT imaging of the abdomen and pelvis was performed using the standard protocol following bolus administration of intravenous contrast. CONTRAST:  123mL OMNIPAQUE IOHEXOL 300 MG/ML  SOLN COMPARISON:  None. FINDINGS: Lower chest: The visualized heart size within normal limits. No pericardial fluid/thickening. There is small bilateral pleural effusions, right greater than left. Patchy airspace opacities are seen at both lung bases, right greater than left. Hepatobiliary: The liver is normal in density without focal abnormality.The main portal vein is patent. There appears to be a small amount of pericholecystic fluid present. No definite layering cough calcified gallstones are seen. There is question of mild fat stranding changes seen. No intrahepatic biliary ductal dilatation is seen. Pancreas: Unremarkable. No pancreatic ductal dilatation or surrounding inflammatory changes. Spleen: Normal in size without focal abnormality. Adrenals/Urinary Tract: Both adrenal glands appear normal. The kidneys and collecting system appear normal without evidence of urinary tract calculus or hydronephrosis. Bladder is unremarkable. Stomach/Bowel: The stomach, small bowel, and colon are normal in appearance. No inflammatory changes, wall thickening, or obstructive findings. Vascular/Lymphatic: There are no enlarged mesenteric, retroperitoneal, or pelvic lymph nodes. No significant vascular findings are present. Reproductive: The uterus and adnexa are unremarkable. A small amount of physiologic free fluid in the cul-de-sac. Other: Small amount of inflammatory changes  seen tracking into the right lower quadrant. Musculoskeletal: No acute or significant osseous findings. IMPRESSION: 1. Small bilateral pleural effusions, right greater than left. 2. Patchy airspace opacities at both lung bases which could be due to atelectasis or infectious etiology. 3. Small amount of pericholecystic fluid and question of mild fat stranding changes around the gallbladder. However no definite calcified gallstones are seen. If further evaluation is required would recommend right upper quadrant ultrasound. Electronically Signed   By: Prudencio Pair M.D.   On: 12/26/2019 22:40   DG Chest Port 1 View  Result Date: 12/26/2019 CLINICAL DATA:  Shortness of breath EXAM: PORTABLE CHEST 1 VIEW COMPARISON:  May 07, 2007 FINDINGS: The heart size and mediastinal contours are within normal limits. There is hazy airspace opacity seen at the right lung base. No pleural effusion is seen. No acute osseous abnormality. IMPRESSION: Hazy airspace opacity at the right lung base which could be due to atelectasis or infectious etiology. Electronically Signed  By: Prudencio Pair M.D.   On: 12/26/2019 20:42    EKG: Independently reviewed. Rapid afib  Assessment/Plan Active Problems:   Atrial fibrillation with RVR (HCC)   A-fib (HCC)  A-fib with RVR Agreed with Cardizem drip and Heparin drip CHADS2 score=1 Will D/W cardio about ASA vs systemic anticoagulation T4 WNL, T3 pending No Hx of drug use Lipid panel in LDL 110 Echo when HR more controlled  B/L pleural effusion Probably from prolonged afib then transient CHF, improved with a 60 mg Lasix while in [place] PRN Lasix Echo when HR comes down Low suspicion for PNA, will D/C ABX. COVID pending  Elevated LFTs Which was normal in Jan 2021, suspect this may be a separate etiology of possible symptomatic gall stone, less likely related to prolonged Afib and transient CHF Her CT in [facility] showed some pericystic fluid but no stones, can not rule out  gallstone, will order RUQ U/S  Elevated Glu Her Glucose was normal In Jan 2021, this time probably related to a-fib.   DVT prophylaxis: Heparin drip Code Status: Full Family Communication: None at bedside Disposition Plan: Once HR controlled, can go home Consults called: Cardio following Admission status: PCU   Lequita Halt MD [facility] Pager [phone]  12/27/2019, 6:29 PM

## 2019-12-27 NOTE — Progress Notes (Signed)
[place] for Heparin Indication: atrial fibrillation CHADSVAS score of 1  Allergies  Allergen Reactions  . Penicillins Other (See Comments)    Childhood allergy    Patient Measurements: Height: 5\' 4"  (162.6 cm) Weight: 194 lb 9.6 oz (88.3 kg) IBW/kg (Calculated) : 54.7 Heparin Dosing Weight: 74.4 kg  Vital Signs: Temp: 98.2 F (36.8 C) (03/28 0801) Temp Source: Oral (03/28 0801) BP: 110/88 (03/28 1557) Pulse Rate: 104 (03/28 1554)  Labs: Recent Labs    12/26/19 1957 12/26/19 2240 12/27/19 1550  HEPARINUNFRC  --   --  0.55  CREATININE 0.90  --   --   TROPONINIHS 7 7  --     Estimated Creatinine Clearance: 80.4 mL/min (by C-G formula based on SCr of 0.9 mg/dL).   Medical History: Past Medical History:  Diagnosis Date  . Borderline glaucoma    popa    Medications:  Scheduled:  . b complex vitamins  1 tablet Oral Daily  . Calcium Carb-Cholecalciferol   Oral Daily  . sodium chloride flush  3 mL Intravenous Q12H  . Vitamin C  500 mg Oral Daily  . Zinc  50 mg Oral Daily    Assessment: Patient is a 34 yof that presents with worsening malaise. Patient was found to be in Afib with RVR. Pharmacy has been asked to dose heparin in this patient for Afib.  Heparin level this afternoon within goal range.  No overt bleeding or complications noted.  Goal of Therapy:  Heparin level 0.3-0.7 units/ml Monitor platelets by anticoagulation protocol: Yes   Plan:  - Continue IV heparin at current rate. - Repeat heparin level in 6 hrs. - Daily heparin level and CBC. - F/u plans for oral anticoagulation eventually?  Marguerite Olea, [facility] Clinical Pharmacist Phone [phone]  12/27/2019 6:34 PM

## 2019-12-27 NOTE — Plan of Care (Signed)
  Problem: Education: Goal: Understanding of medication regimen will improve Outcome: Progressing   Problem: Activity: Goal: Ability to tolerate increased activity will improve Outcome: Progressing   Problem: Cardiac: Goal: Ability to achieve and maintain adequate cardiopulmonary perfusion will improve Outcome: Progressing

## 2019-12-27 NOTE — ED Provider Notes (Signed)
I took over care for this patient at the start of my shift, she had been admitted for A fib w/ RVR and pending transfer when bed available. At start of my shift, pt on diltiazem drip with soft BPs, HR still fast 130s-150s. Increased dilt drip which slightly improved BP and modestly improved rate to 110s-130s. Contacted cardiology for recs, discussed w/ Dr. Audie Box. He recommended continuing dilt and diuresing w/ 60mg  IV lasix. Also started heparin drip. Have contacted [facility] and a bed is currently being cleaned for her.  CRITICAL CARE Performed by: Wenda Overland Jolean Madariaga   Total critical care time: 30 minutes  Critical care time was exclusive of separately billable procedures and treating other patients.  Critical care was necessary to treat or prevent imminent or life-threatening deterioration.  Critical care was time spent personally by me on the following activities: development of treatment plan with patient and/or surrogate as well as nursing, discussions with consultants, evaluation of patient's response to treatment, examination of patient, obtaining history from patient or surrogate, ordering and performing treatments and interventions, ordering and review of laboratory studies, ordering and review of radiographic studies, pulse oximetry and re-evaluation of patient's condition.    Maricus Tanzi, Wenda Overland, MD 12/27/19 [phone]

## 2019-12-27 NOTE — Progress Notes (Signed)
[place] for Heparin Indication: atrial fibrillation CHADSVAS score of 1  Allergies  Allergen Reactions  . Penicillins Other (See Comments)    Childhood allergy    Patient Measurements: Height: 5\' 4"  (162.6 cm) Weight: 195 lb (88.5 kg) IBW/kg (Calculated) : 54.7 Heparin Dosing Weight: 74.4 kg  Vital Signs: Temp: 98.2 F (36.8 C) (03/28 0801) Temp Source: Oral (03/28 0801) BP: 110/76 (03/28 0850) Pulse Rate: 83 (03/28 0850)  Labs: Recent Labs    12/26/19 1957 12/26/19 2240  CREATININE 0.90  --   TROPONINIHS 7 7    Estimated Creatinine Clearance: 80.5 mL/min (by C-G formula based on SCr of 0.9 mg/dL).   Medical History: Past Medical History:  Diagnosis Date  . Borderline glaucoma    popa    Medications:  Scheduled:  . furosemide  60 mg Intravenous STAT  . heparin  4,450 Units Intravenous Once    Assessment: Patient is a 41 yof that presents with worsening malaise. Patient was found to be in Afib with RVR. Pharmacy has been asked to dose heparin in this patient for Afib.  Goal of Therapy:  Heparin level 0.3-0.7 units/ml Monitor platelets by anticoagulation protocol: Yes   Plan:  - Heparin bolus 4450 units IV x 1 dose - Heparin drip @ 1100 units/hr - Heparin level in ~ 6 hours  - Monitor patient for s/s of bleeding and CBC while on heparin   Duanne Limerick PharmD. BCPS  12/27/2019,9:26 AM

## 2019-12-27 NOTE — ED Notes (Signed)
Paged Cardiology for Dr Rex Kras @ [phone]

## 2019-12-27 NOTE — ED Notes (Signed)
Spoke with Joycelyn Schmid at bed placement for [facility]. States she is at the top of the list for next PCU bed, awaiting discharges

## 2019-12-28 ENCOUNTER — Inpatient Hospital Stay (HOSPITAL_COMMUNITY): Payer: Self-pay

## 2019-12-28 DIAGNOSIS — I4891 Unspecified atrial fibrillation: Principal | ICD-10-CM

## 2019-12-28 DIAGNOSIS — I341 Nonrheumatic mitral (valve) prolapse: Secondary | ICD-10-CM

## 2019-12-28 LAB — GLUCOSE, CAPILLARY
Glucose-Capillary: 83 mg/dL (ref 70–99)
Glucose-Capillary: 102 mg/dL — ABNORMAL HIGH (ref 70–99)

## 2019-12-28 LAB — RENAL FUNCTION PANEL
Albumin: 3.6 g/dL (ref 3.5–5.0)
Anion gap: 8 (ref 5–15)
BUN: <5 mg/dL — ABNORMAL LOW (ref 6–20)
CO2: 22 mmol/L (ref 22–32)
Calcium: 8.6 mg/dL — ABNORMAL LOW (ref 8.9–10.3)
Chloride: 106 mmol/L (ref 98–111)
Creatinine, Ser: 0.65 mg/dL (ref 0.44–1.00)
GFR calc Af Amer: >60 mL/min (ref >60)
GFR calc non Af Amer: >60 mL/min (ref >60)
Glucose, Bld: 125 mg/dL — ABNORMAL HIGH (ref 70–99)
Phosphorus: 3 mg/dL (ref 2.5–4.6)
Potassium: 3.7 mmol/L (ref 3.5–5.1)
Sodium: 136 mmol/L (ref 135–145)

## 2019-12-28 LAB — HEMOGLOBIN A1C
Hgb A1c MFr Bld: 5.5 % (ref 4.8–5.6)
Mean Plasma Glucose: 111.15 mg/dL

## 2019-12-28 LAB — CBC
HCT: 35.1 % — ABNORMAL LOW (ref 36.0–46.0)
Hemoglobin: 11.5 g/dL — ABNORMAL LOW (ref 12.0–15.0)
MCH: 31.3 pg (ref 26.0–34.0)
MCHC: 32.8 g/dL (ref 30.0–36.0)
MCV: 95.6 fL (ref 80.0–100.0)
Platelets: 251 10*3/uL (ref 150–400)
RBC: 3.67 MIL/uL — ABNORMAL LOW (ref 3.87–5.11)
RDW: 12.9 % (ref 11.5–15.5)
WBC: 8.2 10*3/uL (ref 4.0–10.5)
nRBC: 0 % (ref 0.0–0.2)

## 2019-12-28 LAB — BASIC METABOLIC PANEL
Anion gap: 13 (ref 5–15)
BUN: 5 mg/dL — ABNORMAL LOW (ref 6–20)
CO2: 23 mmol/L (ref 22–32)
Calcium: 8.7 mg/dL — ABNORMAL LOW (ref 8.9–10.3)
Chloride: 104 mmol/L (ref 98–111)
Creatinine, Ser: 0.69 mg/dL (ref 0.44–1.00)
GFR calc Af Amer: >60 mL/min (ref >60)
GFR calc non Af Amer: >60 mL/min (ref >60)
Glucose, Bld: 101 mg/dL — ABNORMAL HIGH (ref 70–99)
Potassium: 2.8 mmol/L — ABNORMAL LOW (ref 3.5–5.1)
Sodium: 140 mmol/L (ref 135–145)

## 2019-12-28 LAB — ECHOCARDIOGRAM COMPLETE
Height: 64 in
Weight: 3113.6 oz

## 2019-12-28 LAB — PHOSPHORUS: Phosphorus: 4.4 mg/dL (ref 2.5–4.6)

## 2019-12-28 LAB — T3, FREE: T3, Free: 3.6 pg/mL (ref 2.0–4.4)

## 2019-12-28 LAB — MAGNESIUM: Magnesium: 1.6 mg/dL — ABNORMAL LOW (ref 1.7–2.4)

## 2019-12-28 LAB — HEPARIN LEVEL (UNFRACTIONATED): Heparin Unfractionated: 0.45 IU/mL (ref 0.30–0.70)

## 2019-12-28 MED ORDER — LORAZEPAM 1 MG PO TABS
1.0000 mg | ORAL_TABLET | Freq: Once | ORAL | Status: AC | PRN
  Administered 2019-12-28: 1 mg via ORAL
  Filled 2019-12-28: qty 1

## 2019-12-28 MED ORDER — LORAZEPAM 0.5 MG PO TABS: 0.5000 mg | ORAL_TABLET | Freq: Once | ORAL | Status: DC | PRN

## 2019-12-28 MED ORDER — POTASSIUM CHLORIDE CRYS ER 20 MEQ PO TBCR
40.0000 meq | EXTENDED_RELEASE_TABLET | Freq: Once | ORAL | Status: AC
  Administered 2019-12-28: 40 meq via ORAL
  Filled 2019-12-28: qty 2

## 2019-12-28 MED ORDER — POTASSIUM CHLORIDE CRYS ER 20 MEQ PO TBCR
40.0000 meq | EXTENDED_RELEASE_TABLET | ORAL | Status: AC
  Administered 2019-12-28 (×2): 40 meq via ORAL
  Filled 2019-12-28 (×2): qty 2

## 2019-12-28 MED ORDER — LORAZEPAM 2 MG/ML IJ SOLN
1.0000 mg | Freq: Once | INTRAMUSCULAR | Status: AC
  Administered 2019-12-28: 1 mg via INTRAVENOUS
  Filled 2019-12-28: qty 1

## 2019-12-28 MED ORDER — MAGNESIUM SULFATE 2 GM/50ML IV SOLN
2.0000 g | Freq: Once | INTRAVENOUS | Status: AC
  Administered 2019-12-28: 2 g via INTRAVENOUS
  Filled 2019-12-28: qty 50

## 2019-12-28 NOTE — Plan of Care (Signed)
  Problem: Cardiac: Goal: Ability to achieve and maintain adequate cardiopulmonary perfusion will improve Outcome: Progressing   Problem: Health Behavior/Discharge Planning: Goal: Ability to safely manage health-related needs after discharge will improve Outcome: Progressing   

## 2019-12-28 NOTE — TOC Progression Note (Addendum)
Transition of Care [facility]) - Progression Note    Patient Details  Name: Cindy Christian MRN: [id number] Date of Birth: 1971-05-01  Transition of [facility]) CM/SW Contact  Zenon Mayo, RN Phone Number: 12/28/2019, 4:36 PM  Clinical Narrative:    NCM spoke with patient, she has PCP, she does not have insurance, and she states she will need ast with meds depending on what they prescribe for her.  She states she does not want any HH services at this time.  TOC team will follow along to assist with medications (Match). Patient state she will have procedure tomorrow and if she does ok will dc home on Wed.        Expected Discharge Plan and Services                                                 Social Determinants of Health (SDOH) Interventions    Readmission Risk Interventions No flowsheet data found.

## 2019-12-28 NOTE — Progress Notes (Signed)
PROGRESS NOTE    Markieta Brisby  [id number] DOB: 1971-09-04 DOA: 12/26/2019 PCP: Ann Held, DO  Outpatient Specialists:   Brief Narrative:  Patient is a 49 year old Caucasian female with past medical history significant for obesity and significant alcohol use.  Patient was admitted with atrial fibrillation with rapid ventricular response.  Patient reported 6-week history of fatigue, dyspnea on exertion and orthopnea.  No leg edema.  However, patient reported increased abdominal girth with some tightness.  Patient is currently on Cardizem and heparin.  Echocardiogram revealed normal EF, with indeterminate diastolic function.  Cardiac BNP is elevated.  CTA chest is negative for pulmonary embolism.  LFTs are elevated.  Right upper quadrant is negative for acute cholecystitis.  Cardiology team has been consulted to assist with patient's care.   Assessment & Plan:   Active Problems:   Atrial fibrillation with RVR (HCC)   A-fib (HCC)  Atrial fibrillation with rapid ventricular response: -Continue with Cardizem as EF is within normal range. -Aim for rate control. -We will defer decision for anticoagulation to cardiology team -Patient has been counseled to quit alcohol use. -Further management depend on hospital course.  Significant alcohol use/alcohol abuse: -Counseled.  Hypokalemia: -Magnesium is 1.6. -Low potassium may have contributed to severe fatigue and weakness. -Monitor and replete.  Elevated LFTs: -Check acute hepatitis profile. -Lipid profile -Quit alcohol use -Diet and exercise on outpatient basis.  Lose weight.  Obesity: See above.  DVT prophylaxis: On heparin drip Code Status: Full code Family Communication: Husband in the morning Disposition Plan: Awaiting cardiology team.   Consultants:   Cardiology  Procedures:   Echo  Antimicrobials:   None   Subjective: No new complaints No shortness of breath No chest pain No  palpitations  Objective: Vitals:   12/28/19 0055 12/28/19 0640 12/28/19 0713 12/28/19 0724  BP: 100/78 103/77  107/74  Pulse:  100  100  Resp: 16 20 16 17   Temp:  97.9 F (36.6 C)  97.9 F (36.6 C)  TempSrc:  Oral  Oral  SpO2:    95%  Weight:      Height:        Intake/Output Summary (Last 24 hours) at 12/28/2019 0755 Last data filed at 12/28/2019 0700 Gross per 24 hour  Intake 1151.66 ml  Output 4900 ml  Net -3748.34 ml   Filed Weights   12/26/19 2001 12/27/19 1818  Weight: 88.5 kg 88.3 kg    Examination:  General exam: Appears calm and comfortable.  Obese Respiratory system: Clear to auscultation. Respiratory effort normal. Cardiovascular system: S1 & S2, irregularly irregular.   Gastrointestinal system: Abdomen obese, soft and nontender. No organomegaly or masses felt. Normal bowel sounds heard. Central nervous system: Awake and alert.  Patient moves all extremities.   Extremities: No leg edema.  Data Reviewed: I have personally reviewed following labs and imaging studies  CBC: Recent Labs  Lab 12/28/19 0246  WBC 8.2  HGB 11.5*  HCT 35.1*  MCV 95.6  PLT [id number]   Basic Metabolic Panel: Recent Labs  Lab 12/26/19 1957 12/28/19 0246  NA 137 140  K 3.6 2.8*  CL 106 104  CO2 20* 23  GLUCOSE 132* 101*  BUN 15 5*  CREATININE 0.90 0.69  CALCIUM 9.3 8.7*  MG  --  1.6*  PHOS  --  4.4   GFR: Estimated Creatinine Clearance: 90.4 mL/min (by C-G formula based on SCr of 0.69 mg/dL). Liver Function Tests: Recent Labs  Lab 12/26/19 1957  AST 63*  ALT 58*  ALKPHOS 52  BILITOT 0.9  PROT 7.2  ALBUMIN 3.9   Recent Labs  Lab 12/26/19 2248  LIPASE 29   No results for input(s): AMMONIA in the last 168 hours. Coagulation Profile: No results for input(s): INR, PROTIME in the last 168 hours. Cardiac Enzymes: No results for input(s): CKTOTAL, CKMB, CKMBINDEX, TROPONINI in the last 168 hours. BNP (last 3 results) No results for input(s): PROBNP in the last  8760 hours. HbA1C: No results for input(s): HGBA1C in the last 72 hours. CBG: No results for input(s): GLUCAP in the last 168 hours. Lipid Profile: No results for input(s): CHOL, HDL, LDLCALC, TRIG, CHOLHDL, LDLDIRECT in the last 72 hours. Thyroid Function Tests: Recent Labs    12/26/19 2026  TSH 5.209*  FREET4 1.11  T3FREE 3.6   Anemia Panel: No results for input(s): VITAMINB12, FOLATE, FERRITIN, TIBC, IRON, RETICCTPCT in the last 72 hours. Urine analysis:    Component Value Date/Time   COLORURINE YELLOW 12/26/2019 2130   APPEARANCEUR CLEAR 12/26/2019 2130   LABSPEC 1.010 12/26/2019 2130   PHURINE 6.0 12/26/2019 2130   GLUCOSEU NEGATIVE 12/26/2019 2130   HGBUR NEGATIVE 12/26/2019 2130   BILIRUBINUR NEGATIVE 12/26/2019 2130   BILIRUBINUR neg 08/02/2017 1057   KETONESUR NEGATIVE 12/26/2019 2130   PROTEINUR NEGATIVE 12/26/2019 2130   UROBILINOGEN 0.2 08/02/2017 1057   NITRITE NEGATIVE 12/26/2019 2130   LEUKOCYTESUR NEGATIVE 12/26/2019 2130   Sepsis Labs: @LABRCNTIP (procalcitonin:4,lacticidven:4)  ) Recent Results (from the past 240 hour(s))  SARS CORONAVIRUS 2 (TAT 6-24 HRS) Nasopharyngeal Nasopharyngeal Swab     Status: None   Collection Time: 12/27/19 12:31 AM   Specimen: Nasopharyngeal Swab  Result Value Ref Range Status   SARS Coronavirus 2 NEGATIVE NEGATIVE Final    Comment: (NOTE) SARS-CoV-2 target nucleic acids are NOT DETECTED. The SARS-CoV-2 RNA is generally detectable in upper and lower respiratory specimens during the acute phase of infection. Negative results do not preclude SARS-CoV-2 infection, do not rule out co-infections with other pathogens, and should not be used as the sole basis for treatment or other patient management decisions. Negative results must be combined with clinical observations, patient history, and epidemiological information. The expected result is Negative. Fact Sheet for  Patients: SugarRoll.be Fact Sheet for Healthcare Providers: https://www.woods-mathews.com/ This test is not yet approved or cleared by the [place] FDA and  has been authorized for detection and/or diagnosis of SARS-CoV-2 by FDA under an Emergency Use Authorization (EUA). This EUA will remain  in effect (meaning this test can be used) for the duration of the COVID-19 declaration under Section 56 4(b)(1) of the Act, 21 U.S.C. section 360bbb-3(b)(1), unless the authorization is terminated or revoked sooner. Performed at [facility], [place] [address] [place], [place] [address]   MRSA PCR Screening     Status: None   Collection Time: 12/27/19  7:06 PM   Specimen: Nasopharyngeal  Result Value Ref Range Status   MRSA by PCR NEGATIVE NEGATIVE Final    Comment:        The GeneXpert MRSA Assay (FDA approved for NASAL specimens only), is one component of a comprehensive MRSA colonization surveillance program. It is not intended to diagnose MRSA infection nor to guide or monitor treatment for MRSA infections. Performed at [facility], [place] [address] [place], [place] [address]          Radiology Studies: CT Angio Chest PE W/Cm &/Or Wo Cm  Result Date: 12/27/2019 CLINICAL DATA:  PE suspected,  low/intermediate prob, positive D-dimer Shortness of breath for 5 weeks, worse with activity and lying flat. EXAM: CT ANGIOGRAPHY CHEST WITH CONTRAST TECHNIQUE: Multidetector CT imaging of the chest was performed using the standard protocol during bolus administration of intravenous contrast. Multiplanar CT image reconstructions and MIPs were obtained to evaluate the vascular anatomy. CONTRAST:  46mL OMNIPAQUE IOHEXOL 350 MG/ML SOLN COMPARISON:  Lung bases from abdominal CT earlier today. Chest radiograph earlier today. FINDINGS: Cardiovascular: There are no filling defects within the pulmonary arteries to suggest pulmonary embolus. Upper  normal heart size. Thoracic aorta is normal in caliber. Mild aortic atherosclerosis. No evidence of aortic dissection. No pericardial effusion. Mediastinum/Nodes: Small mediastinal and hilar lymph nodes are not enlarged by size criteria. No esophageal wall thickening. No visualized pulmonary nodule. Lungs/Pleura: Small to moderate bilateral pleural effusions. Adjacent atelectasis in both lower lobes. Basilar septal thickening. Peribronchial thickening which may be congestive. Scattered nodular ground-glass opacities in the mid lower lung zones, series 5 images 53, 55, and image 70. Upper Abdomen: Assessed on abdominal CT earlier today. No interval change. Musculoskeletal: There are no acute or suspicious osseous abnormalities. Review of the MIP images confirms the above findings. IMPRESSION: 1. No pulmonary embolus. 2. Small to moderate bilateral pleural effusions, adjacent atelectasis. Basilar septal thickening suspicious for pulmonary edema. Bronchial thickening, suspect this is congestive. 3. Nodular ground-glass opacities in the bilateral mid lower lung zones, nonspecific for pulmonary edema versus infectious or inflammatory etiology. COVID pneumonia can produce a similar appearance, though this is slightly atypical. Aortic Atherosclerosis (ICD10-I70.0). Electronically Signed   By: Keith Rake M.D.   On: 12/27/2019 00:11   CT Abdomen Pelvis W Contrast  Result Date: 12/26/2019 CLINICAL DATA:  Shortness of breath and abdominal distension EXAM: CT ABDOMEN AND PELVIS WITH CONTRAST TECHNIQUE: Multidetector CT imaging of the abdomen and pelvis was performed using the standard protocol following bolus administration of intravenous contrast. CONTRAST:  140mL OMNIPAQUE IOHEXOL 300 MG/ML  SOLN COMPARISON:  None. FINDINGS: Lower chest: The visualized heart size within normal limits. No pericardial fluid/thickening. There is small bilateral pleural effusions, right greater than left. Patchy airspace opacities are  seen at both lung bases, right greater than left. Hepatobiliary: The liver is normal in density without focal abnormality.The main portal vein is patent. There appears to be a small amount of pericholecystic fluid present. No definite layering cough calcified gallstones are seen. There is question of mild fat stranding changes seen. No intrahepatic biliary ductal dilatation is seen. Pancreas: Unremarkable. No pancreatic ductal dilatation or surrounding inflammatory changes. Spleen: Normal in size without focal abnormality. Adrenals/Urinary Tract: Both adrenal glands appear normal. The kidneys and collecting system appear normal without evidence of urinary tract calculus or hydronephrosis. Bladder is unremarkable. Stomach/Bowel: The stomach, small bowel, and colon are normal in appearance. No inflammatory changes, wall thickening, or obstructive findings. Vascular/Lymphatic: There are no enlarged mesenteric, retroperitoneal, or pelvic lymph nodes. No significant vascular findings are present. Reproductive: The uterus and adnexa are unremarkable. A small amount of physiologic free fluid in the cul-de-sac. Other: Small amount of inflammatory changes seen tracking into the right lower quadrant. Musculoskeletal: No acute or significant osseous findings. IMPRESSION: 1. Small bilateral pleural effusions, right greater than left. 2. Patchy airspace opacities at both lung bases which could be due to atelectasis or infectious etiology. 3. Small amount of pericholecystic fluid and question of mild fat stranding changes around the gallbladder. However no definite calcified gallstones are seen. If further evaluation is required would recommend right upper quadrant  ultrasound. Electronically Signed   By: Prudencio Pair M.D.   On: 12/26/2019 22:40   DG Chest Port 1 View  Result Date: 12/26/2019 CLINICAL DATA:  Shortness of breath EXAM: PORTABLE CHEST 1 VIEW COMPARISON:  May 07, 2007 FINDINGS: The heart size and mediastinal  contours are within normal limits. There is hazy airspace opacity seen at the right lung base. No pleural effusion is seen. No acute osseous abnormality. IMPRESSION: Hazy airspace opacity at the right lung base which could be due to atelectasis or infectious etiology. Electronically Signed   By: Prudencio Pair M.D.   On: 12/26/2019 20:42        Scheduled Meds:  ascorbic acid  500 mg Oral Daily   B-complex with vitamin C  1 tablet Oral Daily   calcium-vitamin D  1 tablet Oral Daily   potassium chloride  40 mEq Oral Q4H   sodium chloride flush  3 mL Intravenous Q12H   zinc sulfate  220 mg Oral Daily   Continuous Infusions:  sodium chloride     diltiazem (CARDIZEM) infusion 15 mg/hr (12/27/19 1034)   heparin 1,100 Units/hr (12/27/19 1019)   magnesium sulfate bolus IVPB       LOS: 2 days    Time spent: 35 minutes    Dana Allan, MD  [facility] Pager #: [phone] 7PM-7AM contact night coverage as above

## 2019-12-28 NOTE — Progress Notes (Signed)
  Echocardiogram 2D Echocardiogram has been performed.  Cindy Christian 12/28/2019, 8:55 AM

## 2019-12-28 NOTE — Plan of Care (Signed)
  Problem: Education: Goal: Individualized Educational Video(s) Outcome: Progressing   Problem: Cardiac: Goal: Ability to achieve and maintain adequate cardiopulmonary perfusion will improve Outcome: Progressing   Problem: Health Behavior/Discharge Planning: Goal: Ability to safely manage health-related needs after discharge will improve Outcome: Progressing

## 2019-12-28 NOTE — Progress Notes (Signed)
[place] for Heparin Indication: atrial fibrillation CHADSVAS score of 1  Allergies  Allergen Reactions  . Penicillins Other (See Comments)    Childhood allergy Did it involve swelling of the face/tongue/throat, SOB, or low BP? Unknown Did it involve sudden or severe rash/hives, skin peeling, or any reaction on the inside of your mouth or nose? Unknown Did you need to seek medical attention at a hospital or doctor's office? Unknown When did it last happen?young child If all above answers are "NO", may proceed with cephalosporin use.    Patient Measurements: Height: 5\' 4"  (162.6 cm) Weight: 194 lb 9.6 oz (88.3 kg) IBW/kg (Calculated) : 54.7 Heparin Dosing Weight: 74.4 kg  Vital Signs: Temp: 97.9 F (36.6 C) (03/29 0724) Temp Source: Oral (03/29 0724) BP: 107/74 (03/29 0724) Pulse Rate: 100 (03/29 0724)  Labs: Recent Labs    12/26/19 1957 12/26/19 2240 12/27/19 1550 12/27/19 2205 12/28/19 0246  HGB  --   --   --   --  11.5*  HCT  --   --   --   --  35.1*  PLT  --   --   --   --  251  HEPARINUNFRC  --   --  0.55 0.48 0.45  CREATININE 0.90  --   --   --  0.69  TROPONINIHS 7 7  --   --   --     Estimated Creatinine Clearance: 90.4 mL/min (by C-G formula based on SCr of 0.69 mg/dL).   Medical History: Past Medical History:  Diagnosis Date  . Borderline glaucoma    popa    Medications:  Scheduled:  . ascorbic acid  500 mg Oral Daily  . B-complex with vitamin C  1 tablet Oral Daily  . calcium-vitamin D  1 tablet Oral Daily  . potassium chloride  40 mEq Oral Q4H  . sodium chloride flush  3 mL Intravenous Q12H  . zinc sulfate  220 mg Oral Daily    Assessment: Patient is a 75 yof that presents with worsening malaise. Patient was found to be in Afib with RVR. Pharmacy has been asked to dose heparin in this patient for Afib.  Heparin level this morning within goal range.  No overt bleeding or complications  noted.   3/29 AM update:  Heparin level therapeutic x 3  Goal of Therapy:  Heparin level 0.3-0.7 units/ml Monitor platelets by anticoagulation protocol: Yes   Plan:  -Cont heparin at 1100 units/hr -Daily CBC/HL -Monitor for bleeding -F/U plans for oral anti-coagulation   De Burrs, PharmD Student

## 2019-12-28 NOTE — Consult Note (Addendum)
Cardiology Consultation:   Patient ID: Cindy Christian MRN: [id number]; DOB: 01-30-1971  Admit date: 12/26/2019 Date of Consult: 12/28/2019  Primary Care Provider: Carollee Herter, Alferd Apa, DO Primary Cardiologist: New to [facility]   Patient Profile:   Cindy Christian is a 49 y.o. female with no significant past medical history except alcohol drinking who is being seen today for the evaluation of atrial fibrillation at the request of Dr. Marthenia Rolling.   History of Present Illness:   [name] presented with 5 weeks history of progressive worsening shortness of breath, abdominal distention and fluttering sensation.  Patient reports persistent shortness of breath for the past 5 weeks with intermittent worsening.  She has intermittent fluttering sensation.  Reports orthopnea PND and dizziness intermittently.  No nausea or vomiting.  Low appetite and energy.  She has fluttering sensation for many years lasting for few hours.  She denies chest pain, syncope or melena. She drinks 6 beer/night.  She drinks 4 to 5 days/week.  Denies tobacco smoking or illicit drug use.  No family history of CAD or arrhythmia.   EKG showed atrial fibrillation with rapid ventricular rate.  Chest x-ray showed bibasilar atelectasis versus pneumonia.  CT angio of chest without pulmonary embolism.  It showed small to moderate bilateral pleural effusion.  Chest x-ray with edema versus infectious etiology.  CT of abdomen pelvis showed small amount of fairly cholecystitis fluid.  Unremarkable right upper quadrant abdominal ultrasound. Echo showed preserved LVEF.  No wall motion abnormality.  Mild mitral regurgitation.  TSH elevated but normal free T3 and T4.  Patient is treated with IV Lasix, Cardizem gtt and heparin for A. fib.  She is also treated with ceftriaxone and azithromycin for presumptive pneumonia.  Her symptoms has improved since admitted.  Diuresed 3.7 L.   Past Medical History:  Diagnosis Date  . Borderline glaucoma    [place]     Past Surgical History:  Procedure Laterality Date  . FINGER FRACTURE SURGERY Left 1983   middle finger  . KNEE ARTHROPLASTY Bilateral 2000 R 2008 L  . LEEP  2004   CIN 1-2     Inpatient Medications: Scheduled Meds: . ascorbic acid  500 mg Oral Daily  . B-complex with vitamin C  1 tablet Oral Daily  . calcium-vitamin D  1 tablet Oral Daily  . sodium chloride flush  3 mL Intravenous Q12H  . zinc sulfate  220 mg Oral Daily   Continuous Infusions: . sodium chloride    . diltiazem (CARDIZEM) infusion 15 mg/hr (12/27/19 1034)  . heparin 1,100 Units/hr (12/27/19 1019)   PRN Meds: sodium chloride, acetaminophen, ondansetron (ZOFRAN) IV, sodium chloride flush  Allergies:    Allergies  Allergen Reactions  . Penicillins Other (See Comments)    Childhood allergy Did it involve swelling of the face/tongue/throat, SOB, or low BP? Unknown Did it involve sudden or severe rash/hives, skin peeling, or any reaction on the inside of your mouth or nose? Unknown Did you need to seek medical attention at a hospital or doctor's office? Unknown When did it last happen?young child If all above answers are "NO", may proceed with cephalosporin use.    Social History:   Social History   Socioeconomic History  . Marital status: Single    Spouse name: Not on file  . Number of children: Not on file  . Years of education: Not on file  . Highest education level: Not on file  Occupational History  . Occupation: admin    Comment: Stage manager  construction  Tobacco Use  . Smoking status: Former Smoker    Packs/day: 0.50    Years: 13.00    Pack years: 6.50    Quit date: 04/24/1998    Years since quitting: 21.6  . Smokeless tobacco: Never Used  Substance and Sexual Activity  . Alcohol use: Yes    Alcohol/week: 16.0 standard drinks    Types: 4 Glasses of wine, 12 Cans of beer per week  . Drug use: No  . Sexual activity: Yes    Partners: Male  Other Topics Concern  . Not on file   Social History Narrative   Walking daily   Social Determinants of Health   Financial Resource Strain:   . Difficulty of Paying Living Expenses:   Food Insecurity:   . Worried About Charity fundraiser in the Last Year:   . Arboriculturist in the Last Year:   Transportation Needs:   . Film/video editor (Medical):   Marland Kitchen Lack of Transportation (Non-Medical):   Physical Activity:   . Days of Exercise per Week:   . Minutes of Exercise per Session:   Stress:   . Feeling of Stress :   Social Connections:   . Frequency of Communication with Friends and Family:   . Frequency of Social Gatherings with Friends and Family:   . Attends Religious Services:   . Active Member of Clubs or Organizations:   . Attends Archivist Meetings:   Marland Kitchen Marital Status:   Intimate Partner Violence:   . Fear of Current or Ex-Partner:   . Emotionally Abused:   Marland Kitchen Physically Abused:   . Sexually Abused:     Family History:    Family History  Problem Relation Age of Onset  . Diabetes Maternal Uncle        Type 1  . Stroke Maternal Grandmother      ROS:  Please see the history of present illness.  All other ROS reviewed and negative.     Physical Exam/Data:   Vitals:   12/28/19 0640 12/28/19 0713 12/28/19 0724 12/28/19 1203  BP: 103/77  107/74 99/79  Pulse: 100  100 87  Resp: 20 16 17 20   Temp: 97.9 F (36.6 C)  97.9 F (36.6 C) 98.3 F (36.8 C)  TempSrc: Oral  Oral Oral  SpO2:   95% 96%  Weight:      Height:        Intake/Output Summary (Last 24 hours) at 12/28/2019 1411 Last data filed at 12/28/2019 1203 Gross per 24 hour  Intake 1812.97 ml  Output 1750 ml  Net 62.97 ml   Last 3 Weights 12/27/2019 12/26/2019 10/29/2019  Weight (lbs) 194 lb 9.6 oz 195 lb 200 lb 9.6 oz  Weight (kg) 88.27 kg 88.451 kg 90.992 kg     Body mass index is 33.4 kg/m.  General:  Well nourished, well developed, in no acute distress* HEENT: normal Lymph: no adenopathy Neck: no JVD Endocrine:   No thryomegaly Vascular: No carotid bruits; FA pulses 2+ bilaterally without bruits  Cardiac:  normal S1, S2; irregular tachycardic; no murmur  Lungs:  clear to auscultation bilaterally, no wheezing, rhonchi or rales  Abd: soft, nontender, no hepatomegaly  Ext: no edema Musculoskeletal:  No deformities, BUE and BLE strength normal and equal Skin: warm and dry  Neuro:  CNs 2-12 intact, no focal abnormalities noted Psych:  Normal affect   EKG:  The EKG was personally reviewed and demonstrates: Atrial fibrillation at  rate of 189 bpm Telemetry:  Telemetry was personally reviewed and demonstrates: afib at 100-120s  Relevant CV Studies:  Echo 12/28/19 1. Technically difficult study due to atrial fibrillation and poor  windows. Grossly normal LV systolic function. Left ventricular ejection  fraction, by estimation, is 55 to 60%. The left ventricle has normal  function. The left ventricle has no regional  wall motion abnormalities. There is mild left ventricular hypertrophy.  Left ventricular diastolic parameters are indeterminate.  2. Right ventricular systolic function is normal. The right ventricular  size is mildly enlarged.  3. Right atrial size was mildly dilated.  4. The mitral valve is normal in structure. Mild mitral valve  regurgitation.  5. The aortic valve was not well visualized. Aortic valve regurgitation  is not visualized. AV gradients were not measured    Laboratory Data:  High Sensitivity Troponin:   Recent Labs  Lab 12/26/19 1957 12/26/19 2240  TROPONINIHS 7 7     Chemistry Recent Labs  Lab 12/26/19 1957 12/28/19 0246  NA 137 140  K 3.6 2.8*  CL 106 104  CO2 20* 23  GLUCOSE 132* 101*  BUN 15 5*  CREATININE 0.90 0.69  CALCIUM 9.3 8.7*  GFRNONAA >60 >60  GFRAA >60 >60  ANIONGAP 11 13    Recent Labs  Lab 12/26/19 1957  PROT 7.2  ALBUMIN 3.9  AST 63*  ALT 58*  ALKPHOS 52  BILITOT 0.9   Hematology Recent Labs  Lab 12/28/19 0246  WBC 8.2   RBC 3.67*  HGB 11.5*  HCT 35.1*  MCV 95.6  MCH 31.3  MCHC 32.8  RDW 12.9  PLT 251   BNP Recent Labs  Lab 12/26/19 1957  BNP 179.7*    DDimer  Recent Labs  Lab 12/26/19 1957  DDIMER 1.19*     Radiology/Studies:  CT Angio Chest PE W/Cm &/Or Wo Cm  Result Date: 12/27/2019 CLINICAL DATA:  PE suspected, low/intermediate prob, positive D-dimer Shortness of breath for 5 weeks, worse with activity and lying flat. EXAM: CT ANGIOGRAPHY CHEST WITH CONTRAST TECHNIQUE: Multidetector CT imaging of the chest was performed using the standard protocol during bolus administration of intravenous contrast. Multiplanar CT image reconstructions and MIPs were obtained to evaluate the vascular anatomy. CONTRAST:  29mL OMNIPAQUE IOHEXOL 350 MG/ML SOLN COMPARISON:  Lung bases from abdominal CT earlier today. Chest radiograph earlier today. FINDINGS: Cardiovascular: There are no filling defects within the pulmonary arteries to suggest pulmonary embolus. Upper normal heart size. Thoracic aorta is normal in caliber. Mild aortic atherosclerosis. No evidence of aortic dissection. No pericardial effusion. Mediastinum/Nodes: Small mediastinal and hilar lymph nodes are not enlarged by size criteria. No esophageal wall thickening. No visualized pulmonary nodule. Lungs/Pleura: Small to moderate bilateral pleural effusions. Adjacent atelectasis in both lower lobes. Basilar septal thickening. Peribronchial thickening which may be congestive. Scattered nodular ground-glass opacities in the mid lower lung zones, series 5 images 53, 55, and image 70. Upper Abdomen: Assessed on abdominal CT earlier today. No interval change. Musculoskeletal: There are no acute or suspicious osseous abnormalities. Review of the MIP images confirms the above findings. IMPRESSION: 1. No pulmonary embolus. 2. Small to moderate bilateral pleural effusions, adjacent atelectasis. Basilar septal thickening suspicious for pulmonary edema. Bronchial  thickening, suspect this is congestive. 3. Nodular ground-glass opacities in the bilateral mid lower lung zones, nonspecific for pulmonary edema versus infectious or inflammatory etiology. COVID pneumonia can produce a similar appearance, though this is slightly atypical. Aortic Atherosclerosis (ICD10-I70.0). Electronically Signed  By: Keith Rake M.D.   On: 12/27/2019 00:11   CT Abdomen Pelvis W Contrast  Result Date: 12/26/2019 CLINICAL DATA:  Shortness of breath and abdominal distension EXAM: CT ABDOMEN AND PELVIS WITH CONTRAST TECHNIQUE: Multidetector CT imaging of the abdomen and pelvis was performed using the standard protocol following bolus administration of intravenous contrast. CONTRAST:  114mL OMNIPAQUE IOHEXOL 300 MG/ML  SOLN COMPARISON:  None. FINDINGS: Lower chest: The visualized heart size within normal limits. No pericardial fluid/thickening. There is small bilateral pleural effusions, right greater than left. Patchy airspace opacities are seen at both lung bases, right greater than left. Hepatobiliary: The liver is normal in density without focal abnormality.The main portal vein is patent. There appears to be a small amount of pericholecystic fluid present. No definite layering cough calcified gallstones are seen. There is question of mild fat stranding changes seen. No intrahepatic biliary ductal dilatation is seen. Pancreas: Unremarkable. No pancreatic ductal dilatation or surrounding inflammatory changes. Spleen: Normal in size without focal abnormality. Adrenals/Urinary Tract: Both adrenal glands appear normal. The kidneys and collecting system appear normal without evidence of urinary tract calculus or hydronephrosis. Bladder is unremarkable. Stomach/Bowel: The stomach, small bowel, and colon are normal in appearance. No inflammatory changes, wall thickening, or obstructive findings. Vascular/Lymphatic: There are no enlarged mesenteric, retroperitoneal, or pelvic lymph nodes. No  significant vascular findings are present. Reproductive: The uterus and adnexa are unremarkable. A small amount of physiologic free fluid in the cul-de-sac. Other: Small amount of inflammatory changes seen tracking into the right lower quadrant. Musculoskeletal: No acute or significant osseous findings. IMPRESSION: 1. Small bilateral pleural effusions, right greater than left. 2. Patchy airspace opacities at both lung bases which could be due to atelectasis or infectious etiology. 3. Small amount of pericholecystic fluid and question of mild fat stranding changes around the gallbladder. However no definite calcified gallstones are seen. If further evaluation is required would recommend right upper quadrant ultrasound. Electronically Signed   By: Prudencio Pair M.D.   On: 12/26/2019 22:40   DG Chest Port 1 View  Result Date: 12/26/2019 CLINICAL DATA:  Shortness of breath EXAM: PORTABLE CHEST 1 VIEW COMPARISON:  May 07, 2007 FINDINGS: The heart size and mediastinal contours are within normal limits. There is hazy airspace opacity seen at the right lung base. No pleural effusion is seen. No acute osseous abnormality. IMPRESSION: Hazy airspace opacity at the right lung base which could be due to atelectasis or infectious etiology. Electronically Signed   By: Prudencio Pair M.D.   On: 12/26/2019 20:42   ECHOCARDIOGRAM COMPLETE  Result Date: 12/28/2019    ECHOCARDIOGRAM REPORT   Patient Name:   JENEICE RISINGER Date of Exam: 12/28/2019 Medical Rec #:  [id number]   Height:       64.0 in Accession #:    [id number]  Weight:       194.6 lb Date of Birth:  1970/12/26   BSA:          1.934 m Patient Age:    10 years    BP:           107/74 mmHg Patient Gender: F           HR:           97 bpm. Exam Location:  Inpatient Procedure: 2D Echo Indications:    atrial fibrillation  History:        Patient has no prior history of Echocardiogram examinations.  Risk Factors:Former Smoker.  Sonographer:    Jannett Celestine RDCS  (AE) Referring Phys: [id number] Lequita Halt IMPRESSIONS  1. Technically difficult study due to atrial fibrillation and poor windows. Grossly normal LV systolic function. Left ventricular ejection fraction, by estimation, is 55 to 60%. The left ventricle has normal function. The left ventricle has no regional wall motion abnormalities. There is mild left ventricular hypertrophy. Left ventricular diastolic parameters are indeterminate.  2. Right ventricular systolic function is normal. The right ventricular size is mildly enlarged.  3. Right atrial size was mildly dilated.  4. The mitral valve is normal in structure. Mild mitral valve regurgitation.  5. The aortic valve was not well visualized. Aortic valve regurgitation is not visualized. AV gradients were not measured FINDINGS  Left Ventricle: Left ventricular ejection fraction, by estimation, is 55 to 60%. The left ventricle has normal function. The left ventricle has no regional wall motion abnormalities. The left ventricular internal cavity size was normal in size. There is  mild left ventricular hypertrophy. Left ventricular diastolic parameters are indeterminate. Right Ventricle: The right ventricular size is mildly enlarged. Right vetricular wall thickness was not assessed. Right ventricular systolic function is normal. Left Atrium: Left atrial size was normal in size. Right Atrium: Right atrial size was mildly dilated. Pericardium: There is no evidence of pericardial effusion. Mitral Valve: The mitral valve is normal in structure. Mild mitral valve regurgitation. Tricuspid Valve: The tricuspid valve is normal in structure. Tricuspid valve regurgitation is trivial. Aortic Valve: The aortic valve was not well visualized. Aortic valve regurgitation is not visualized. Pulmonic Valve: The pulmonic valve was not well visualized. Pulmonic valve regurgitation is trivial. Aorta: The aortic root is normal in size and structure. IAS/Shunts: The interatrial septum was not  well visualized.  LEFT VENTRICLE PLAX 2D LVIDd:         4.60 cm LVIDs:         3.00 cm LV PW:         1.00 cm LV IVS:        0.80 cm LVOT diam:     1.90 cm LV SV:         47 LV SV Index:   24 LVOT Area:     2.84 cm  RIGHT VENTRICLE RV S prime:     13.90 cm/s TAPSE (M-mode): 1.7 cm LEFT ATRIUM           Index       RIGHT ATRIUM           Index LA diam:      4.00 cm 2.07 cm/m  RA Area:     21.50 cm LA Vol (A4C): 63.8 ml 32.99 ml/m RA Volume:   63.10 ml  32.63 ml/m  AORTIC VALVE LVOT Vmax:   95.30 cm/s LVOT Vmean:  68.500 cm/s LVOT VTI:    0.167 m  AORTA Ao Root diam: 2.60 cm MITRAL VALVE               TRICUSPID VALVE MV Area (PHT): 2.96 cm    TR Peak grad:   25.2 mmHg MV Decel Time: 256 msec    TR Vmax:        251.00 cm/s MV E velocity: 90.00 cm/s                            SHUNTS  Systemic VTI:  0.17 m                            Systemic Diam: 1.90 cm Oswaldo Milian MD Electronically signed by Oswaldo Milian MD Signature Date/Time: 12/28/2019/11:03:43 AM    Final    US Abdomen Limited RUQ  Result Date: 12/28/2019 CLINICAL DATA:  Elevated liver enzymes EXAM: ULTRASOUND ABDOMEN LIMITED RIGHT UPPER QUADRANT COMPARISON:  None. FINDINGS: Gallbladder: No gallstones or wall thickening visualized. There is no pericholecystic fluid. No sonographic Murphy sign noted by sonographer. Common bile duct: Diameter: 3 mm. No intrahepatic or extrahepatic biliary duct dilatation. Liver: No focal lesion identified. Within normal limits in parenchymal echogenicity. Portal vein is patent on color Doppler imaging with normal direction of blood flow towards the liver. Other: There is a right pleural effusion. IMPRESSION: Right pleural effusion.  Study otherwise unremarkable. Electronically Signed   By: Lowella Grip III M.D.   On: 12/28/2019 07:59     Assessment and Plan:   1. New onset atrial fibrillation with rapid ventricular rate (Likely alcohol induced) -Patient reports  intermittent fluttering sensation lasting for hours for past couple of years.  Current symptoms started 5 weeks ago.  Heart rate improved on IV Cardizem.  CHA2DS2-VASc score of 1 for sex.  Her breathing and abdominal distention has improved.  On IV heparin for anticoagulation. -Echocardiogram showed preserved LV function, no wall motion abnormality and mild MR. -TSH is minimally elevated however normal free T3 and T4. -Correct electrolyte abnormality - TEE cardioversion or cardioversion after 3 weeks of anticoagulation.   2.  Volume overload -BNP 179.  Likely related to elevated heart rate.  Her breathing and distention has been improved.  She diuresed -3.7 L so far.  Lipase normal  3.  Alcohol abuse -Recommended cessation  4.  Hypokalemia and hypomagnesemia -Supplemented -Keep potassium above 4 and magnesium above 2  Dr. Audie Box to see later today.   For questions or updates, please contact [facility] Please consult [email or web address] for contact info under     Jarrett Soho, PA  12/28/2019 2:11 PM

## 2019-12-29 ENCOUNTER — Institutional Professional Consult (permissible substitution): Payer: Self-pay | Admitting: Internal Medicine

## 2019-12-29 DIAGNOSIS — I4819 Other persistent atrial fibrillation: Secondary | ICD-10-CM

## 2019-12-29 LAB — CBC
HCT: 36.5 % (ref 36.0–46.0)
Hemoglobin: 11.6 g/dL — ABNORMAL LOW (ref 12.0–15.0)
MCH: 31 pg (ref 26.0–34.0)
MCHC: 31.8 g/dL (ref 30.0–36.0)
MCV: 97.6 fL (ref 80.0–100.0)
Platelets: 255 10*3/uL (ref 150–400)
RBC: 3.74 MIL/uL — ABNORMAL LOW (ref 3.87–5.11)
RDW: 13.2 % (ref 11.5–15.5)
WBC: 6.9 10*3/uL (ref 4.0–10.5)
nRBC: 0 % (ref 0.0–0.2)

## 2019-12-29 LAB — RENAL FUNCTION PANEL
Albumin: 3.3 g/dL — ABNORMAL LOW (ref 3.5–5.0)
Anion gap: 7 (ref 5–15)
BUN: <5 mg/dL — ABNORMAL LOW (ref 6–20)
CO2: 23 mmol/L (ref 22–32)
Calcium: 8.6 mg/dL — ABNORMAL LOW (ref 8.9–10.3)
Chloride: 110 mmol/L (ref 98–111)
Creatinine, Ser: 0.62 mg/dL (ref 0.44–1.00)
GFR calc Af Amer: >60 mL/min (ref >60)
GFR calc non Af Amer: >60 mL/min (ref >60)
Glucose, Bld: 106 mg/dL — ABNORMAL HIGH (ref 70–99)
Phosphorus: 3.3 mg/dL (ref 2.5–4.6)
Potassium: 4.2 mmol/L (ref 3.5–5.1)
Sodium: 140 mmol/L (ref 135–145)

## 2019-12-29 LAB — HEPARIN LEVEL (UNFRACTIONATED): Heparin Unfractionated: 0.5 IU/mL (ref 0.30–0.70)

## 2019-12-29 LAB — MAGNESIUM: Magnesium: 1.9 mg/dL (ref 1.7–2.4)

## 2019-12-29 LAB — GLUCOSE, CAPILLARY
Glucose-Capillary: 100 mg/dL — ABNORMAL HIGH (ref 70–99)
Glucose-Capillary: 115 mg/dL — ABNORMAL HIGH (ref 70–99)
Glucose-Capillary: 123 mg/dL — ABNORMAL HIGH (ref 70–99)

## 2019-12-29 MED ORDER — APIXABAN 5 MG PO TABS
10.0000 mg | ORAL_TABLET | Freq: Once | ORAL | Status: AC
  Administered 2019-12-29: 10 mg via ORAL
  Filled 2019-12-29: qty 2

## 2019-12-29 MED ORDER — LORAZEPAM 1 MG PO TABS
1.0000 mg | ORAL_TABLET | Freq: Every evening | ORAL | Status: DC | PRN
  Administered 2019-12-29 – 2019-12-30 (×2): 1 mg via ORAL
  Filled 2019-12-29 (×2): qty 1

## 2019-12-29 MED ORDER — LORAZEPAM 1 MG PO TABS: 1.0000 mg | ORAL_TABLET | Freq: Every day | ORAL | Status: DC

## 2019-12-29 MED ORDER — FUROSEMIDE 10 MG/ML IJ SOLN
40.0000 mg | Freq: Once | INTRAMUSCULAR | Status: AC
  Administered 2019-12-29: 40 mg via INTRAVENOUS
  Filled 2019-12-29: qty 4

## 2019-12-29 MED ORDER — APIXABAN 5 MG PO TABS
5.0000 mg | ORAL_TABLET | Freq: Two times a day (BID) | ORAL | Status: DC
  Administered 2019-12-29 – 2019-12-31 (×4): 5 mg via ORAL
  Filled 2019-12-29 (×5): qty 1

## 2019-12-29 NOTE — Plan of Care (Signed)
  Problem: Health Behavior/Discharge Planning: Goal: Ability to manage health-related needs will improve Outcome: Progressing   Problem: Clinical Measurements: Goal: Will remain free from infection Outcome: Progressing Goal: Respiratory complications will improve Outcome: Progressing   Problem: Activity: Goal: Risk for activity intolerance will decrease Outcome: Progressing   Problem: Pain Managment: Goal: General experience of comfort will improve Outcome: Progressing

## 2019-12-29 NOTE — Discharge Instructions (Addendum)
[facility] will contact you to arrange an outpatient exercise tolerance test to evaluate for decreased blood flow to your heart given your new diagnosis of atrial fibrillation. If you do not hear from [place] within the next few days, please call [phone] to follow-up. Thank you!   Atrial Fibrillation  Atrial fibrillation is a type of irregular or rapid heartbeat (arrhythmia). In atrial fibrillation, the top part of the heart (atria) beats in an irregular pattern. This makes the heart unable to pump blood normally and effectively. The goal of treatment is to prevent blood clots from forming, control your heart rate, or restore your heartbeat to a normal rhythm. If this condition is not treated, it can cause serious problems, such as a weakened heart muscle (cardiomyopathy) or a stroke. What are the causes? This condition is often caused by medical conditions that damage the heart's electrical system. These include:  High blood pressure (hypertension). This is the most common cause.  Certain heart problems or conditions, such as heart failure, coronary artery disease, heart valve problems, or heart surgery.  Diabetes.  Overactive thyroid (hyperthyroidism).  Obesity.  Chronic kidney disease. In some cases, the cause of this condition is not known. What increases the risk? This condition is more likely to develop in:  Older people.  People who smoke.  Athletes who do endurance exercise.  People who have a family history of atrial fibrillation.  Men.  People who use drugs.  People who drink a lot of alcohol.  People who have lung conditions, such as emphysema, pneumonia, or COPD.  People who have obstructive sleep apnea. What are the signs or symptoms? Symptoms of this condition include:  A feeling that your heart is racing or beating irregularly.  Discomfort or pain in your chest.  Shortness of breath.  Sudden light-headedness or weakness.  Tiring easily during  exercise or activity.  Fatigue.  Syncope (fainting).  Sweating. In some cases, there are no symptoms. How is this diagnosed? Your health care provider may detect atrial fibrillation when taking your pulse. If detected, this condition may be diagnosed with:  An electrocardiogram (ECG) to check electrical signals of the heart.  An ambulatory cardiac monitor to record your heart's activity for a few days.  A transthoracic echocardiogram (TTE) to create pictures of your heart.  A transesophageal echocardiogram (TEE) to create even closer pictures of your heart.  A stress test to check your blood supply while you exercise.  Imaging tests, such as a CT scan or chest X-ray.  Blood tests. How is this treated? Treatment depends on underlying conditions and how you feel when you experience atrial fibrillation. This condition may be treated with:  Medicines to prevent blood clots or to treat heart rate or heart rhythm problems.  Electrical cardioversion to reset the heart's rhythm.  A pacemaker to correct abnormal heart rhythm.  Ablation to remove the heart tissue that sends abnormal signals.  Left atrial appendage closure to seal the area where blood clots can form. In some cases, underlying conditions will be treated. Follow these instructions at home: Medicines  Take over-the counter and prescription medicines only as told by your health care provider.  Do not take any new medicines without talking to your health care provider.  If you are taking blood thinners: ? Talk with your health care provider before you take any medicines that contain aspirin or NSAIDs, such as ibuprofen. These medicines increase your risk for dangerous bleeding. ? Take your medicine exactly as told, at  the same time every day. ? Avoid activities that could cause injury or bruising, and follow instructions about how to prevent falls. ? Wear a medical alert bracelet or carry a card that lists what  medicines you take. Lifestyle      Do not use any products that contain nicotine or tobacco, such as cigarettes, e-cigarettes, and chewing tobacco. If you need help quitting, ask your health care provider.  Eat heart-healthy foods. Talk with a dietitian to make an eating plan that is right for you.  Exercise regularly as told by your health care provider.  Do not drink alcohol.  Lose weight if you are overweight.  Do not use drugs, including cannabis. General instructions  If you have obstructive sleep apnea, manage your condition as told by your health care provider.  Do not use diet pills unless your health care provider approves. Diet pills can make heart problems worse.  Keep all follow-up visits as told by your health care provider. This is important. Contact a health care provider if you:  Notice a change in the rate, rhythm, or strength of your heartbeat.  Are taking a blood thinner and you notice more bruising.  Tire more easily when you exercise or do heavy work.  Have a sudden change in weight. Get help right away if you have:   Chest pain, abdominal pain, sweating, or weakness.  Trouble breathing.  Side effects of blood thinners, such as blood in your vomit, stool, or urine, or bleeding that cannot stop.  Any symptoms of a stroke. "BE FAST" is an easy way to remember the main warning signs of a stroke: ? B - Balance. Signs are dizziness, sudden trouble walking, or loss of balance. ? E - Eyes. Signs are trouble seeing or a sudden change in vision. ? F - Face. Signs are sudden weakness or numbness of the face, or the face or eyelid drooping on one side. ? A - Arms. Signs are weakness or numbness in an arm. This happens suddenly and usually on one side of the body. ? S - Speech. Signs are sudden trouble speaking, slurred speech, or trouble understanding what people say. ? T - Time. Time to call emergency services. Write down what time symptoms started.  Other  signs of a stroke, such as: ? A sudden, severe headache with no known cause. ? Nausea or vomiting. ? Seizure. These symptoms may represent a serious problem that is an emergency. Do not wait to see if the symptoms will go away. Get medical help right away. Call your local emergency services (911 in the [place] Do not drive yourself to the hospital. Summary  Atrial fibrillation is a type of irregular or rapid heartbeat (arrhythmia).  Symptoms include a feeling that your heart is beating fast or irregularly.  You may be given medicines to prevent blood clots or to treat heart rate or heart rhythm problems.  Get help right away if you have signs or symptoms of a stroke.  Get help right away if you cannot catch your breath or have chest pain or pressure. This information is not intended to replace advice given to you by your health care provider. Make sure you discuss any questions you have with your health care provider. Document Revised: 03/11/2019 Document Reviewed: 03/11/2019 Elsevier Patient Education  [place] on my medicine - ELIQUIS (apixaban)  This medication education was reviewed with me or my healthcare representative as part of my discharge preparation.  The  pharmacist that spoke with me during my hospital stay was:  Pat Patrick, [facility]  Why was Eliquis prescribed for you? Eliquis was prescribed for you to reduce the risk of a blood clot forming that can cause a stroke if you have a medical condition called atrial fibrillation (a type of irregular heartbeat).  What do You need to know about Eliquis ? Take your Eliquis TWICE DAILY - one tablet in the morning and one tablet in the evening with or without food. If you have difficulty swallowing the tablet whole please discuss with your pharmacist how to take the medication safely.  Take Eliquis exactly as prescribed by your doctor and DO NOT stop taking Eliquis without talking to the doctor who  prescribed the medication.  Stopping may increase your risk of developing a stroke.  Refill your prescription before you run out.  After discharge, you should have regular check-up appointments with your healthcare provider that is prescribing your Eliquis.  In the future your dose may need to be changed if your kidney function or weight changes by a significant amount or as you get older.  What do you do if you miss a dose? If you miss a dose, take it as soon as you remember on the same day and resume taking twice daily.  Do not take more than one dose of ELIQUIS at the same time to make up a missed dose.  Important Safety Information A possible side effect of Eliquis is bleeding. You should call your healthcare provider right away if you experience any of the following: ? Bleeding from an injury or your nose that does not stop. ? Unusual colored urine (red or dark brown) or unusual colored stools (red or black). ? Unusual bruising for unknown reasons. ? A serious fall or if you hit your head (even if there is no bleeding).  Some medicines may interact with Eliquis and might increase your risk of bleeding or clotting while on Eliquis. To help avoid this, consult your healthcare provider or pharmacist prior to using any new prescription or non-prescription medications, including herbals, vitamins, non-steroidal anti-inflammatory drugs (NSAIDs) and supplements.  This website has more information on Eliquis (apixaban): http://www.eliquis.com/eliquis/home

## 2019-12-29 NOTE — H&P (View-Only) (Signed)
Cardiology Progress Note  Patient ID: Cindy Christian MRN: [id number] DOB: [birth year] Date of Encounter: 12/29/2019  Primary Cardiologist: No primary care provider on file.  Subjective  Remains in atrial fibrillation.  Unable to perform cardioversion today due to schedule.  Plan for 7:30 AM tomorrow.  ROS:  All other ROS reviewed and negative. Pertinent positives noted in the HPI.     Inpatient Medications  Scheduled Meds: . apixaban  5 mg Oral BID  . ascorbic acid  500 mg Oral Daily  . B-complex with vitamin C  1 tablet Oral Daily  . calcium-vitamin D  1 tablet Oral Daily  . furosemide  40 mg Intravenous Once  . sodium chloride flush  3 mL Intravenous Q12H  . zinc sulfate  220 mg Oral Daily   Continuous Infusions: . sodium chloride    . diltiazem (CARDIZEM) infusion 15 mg/hr (12/28/19 2231)   PRN Meds: sodium chloride, acetaminophen, ondansetron (ZOFRAN) IV, sodium chloride flush   Vital Signs   Vitals:   12/28/19 1925 12/28/19 2352 12/29/19 0555 12/29/19 0748  BP: (!) 124/95 90/68 110/82 104/81  Pulse: (!) 126 91 77 87  Resp: (!) 24 19 18  (!) 22  Temp: 98 F (36.7 C) 98.1 F (36.7 C) 97.7 F (36.5 C) 97.9 F (36.6 C)  TempSrc: Oral Oral Oral Oral  SpO2:  100% 99%   Weight:   89.3 kg   Height:        Intake/Output Summary (Last 24 hours) at 12/29/2019 0935 Last data filed at 12/29/2019 [id number] Gross per 24 hour  Intake 1691.98 ml  Output 900 ml  Net 791.98 ml   Last 3 Weights 12/29/2019 12/27/2019 12/26/2019  Weight (lbs) 196 lb 13.9 oz 194 lb 9.6 oz 195 lb  Weight (kg) 89.3 kg 88.27 kg 88.451 kg      Telemetry  Overnight telemetry shows atrial fibrillation with heart rate in the 90-110 range, which I personally reviewed.   ECG  The most recent ECG shows A. fib with RVR, heart rate 189, which I personally reviewed.   Physical Exam   Vitals:   12/28/19 1925 12/28/19 2352 12/29/19 0555 12/29/19 0748  BP: (!) 124/95 90/68 110/82 104/81  Pulse: (!) 126 91 77 87   Resp: (!) 24 19 18  (!) 22  Temp: 98 F (36.7 C) 98.1 F (36.7 C) 97.7 F (36.5 C) 97.9 F (36.6 C)  TempSrc: Oral Oral Oral Oral  SpO2:  100% 99%   Weight:   89.3 kg   Height:         Intake/Output Summary (Last 24 hours) at 12/29/2019 0935 Last data filed at 12/29/2019 [id number] Gross per 24 hour  Intake 1691.98 ml  Output 900 ml  Net 791.98 ml    Last 3 Weights 12/29/2019 12/27/2019 12/26/2019  Weight (lbs) 196 lb 13.9 oz 194 lb 9.6 oz 195 lb  Weight (kg) 89.3 kg 88.27 kg 88.451 kg    Body mass index is 33.79 kg/m.  General: Well nourished, well developed, in no acute distress Head: Atraumatic, normal size  Eyes: PEERLA, EOMI  Neck: Supple, no JVD Endocrine: No thryomegaly Cardiac: Irregular rhythm, no murmurs rubs or gallops Lungs: Clear to auscultation bilaterally, no wheezing, rhonchi or rales  Abd: Soft, nontender, no hepatomegaly  Ext: Trace edema Musculoskeletal: No deformities, BUE and BLE strength normal and equal Skin: Warm and dry, no rashes   Neuro: Alert and oriented to person, place, time, and situation, CNII-XII grossly intact, no focal deficits  Psych:  Normal mood and affect   Labs  High Sensitivity Troponin:   Recent Labs  Lab 12/26/19 1957 12/26/19 2240  TROPONINIHS 7 7     Cardiac EnzymesNo results for input(s): TROPONINI in the last 168 hours. No results for input(s): TROPIPOC in the last 168 hours.  Chemistry Recent Labs  Lab 12/26/19 1957 12/26/19 1957 12/28/19 0246 12/28/19 1559 12/29/19 0217  NA 137   < > 140 136 140  K 3.6   < > 2.8* 3.7 4.2  CL 106   < > 104 106 110  CO2 20*   < > 23 22 23   GLUCOSE 132*   < > 101* 125* 106*  BUN 15   < > 5* <5* <5*  CREATININE 0.90   < > 0.69 0.65 0.62  CALCIUM 9.3   < > 8.7* 8.6* 8.6*  PROT 7.2  --   --   --   --   ALBUMIN 3.9  --   --  3.6 3.3*  AST 63*  --   --   --   --   ALT 58*  --   --   --   --   ALKPHOS 52  --   --   --   --   BILITOT 0.9  --   --   --   --   GFRNONAA >60   < > >60 >60  >60  GFRAA >60   < > >60 >60 >60  ANIONGAP 11   < > 13 8 7    < > = values in this interval not displayed.    Hematology Recent Labs  Lab 12/28/19 0246 12/29/19 0217  WBC 8.2 6.9  RBC 3.67* 3.74*  HGB 11.5* 11.6*  HCT 35.1* 36.5  MCV 95.6 97.6  MCH 31.3 31.0  MCHC 32.8 31.8  RDW 12.9 13.2  PLT 251 255   BNP Recent Labs  Lab 12/26/19 1957  BNP 179.7*    DDimer  Recent Labs  Lab 12/26/19 1957  DDIMER 1.19*     Radiology  ECHOCARDIOGRAM COMPLETE  Result Date: 12/28/2019    ECHOCARDIOGRAM REPORT   Patient Name:   Cindy Christian Date of Exam: 12/28/2019 Medical Rec #:  [id number]   Height:       64.0 in Accession #:    [id number]  Weight:       194.6 lb Date of Birth:  [birth year]   BSA:          1.934 m Patient Age:    49 years    BP:           107/74 mmHg Patient Gender: F           HR:           97 bpm. Exam Location:  Inpatient Procedure: 2D Echo Indications:    atrial fibrillation  History:        Patient has no prior history of Echocardiogram examinations.                 Risk Factors:Former Smoker.  Sonographer:    Jannett Celestine RDCS (AE) Referring Phys: [id number] Lequita Halt IMPRESSIONS  1. Technically difficult study due to atrial fibrillation and poor windows. Grossly normal LV systolic function. Left ventricular ejection fraction, by estimation, is 55 to 60%. The left ventricle has normal function. The left ventricle has no regional wall motion abnormalities. There is mild left ventricular hypertrophy. Left ventricular diastolic parameters are indeterminate.  2. Right ventricular  systolic function is normal. The right ventricular size is mildly enlarged.  3. Right atrial size was mildly dilated.  4. The mitral valve is normal in structure. Mild mitral valve regurgitation.  5. The aortic valve was not well visualized. Aortic valve regurgitation is not visualized. AV gradients were not measured FINDINGS  Left Ventricle: Left ventricular ejection fraction, by estimation, is 55 to 60%.  The left ventricle has normal function. The left ventricle has no regional wall motion abnormalities. The left ventricular internal cavity size was normal in size. There is  mild left ventricular hypertrophy. Left ventricular diastolic parameters are indeterminate. Right Ventricle: The right ventricular size is mildly enlarged. Right vetricular wall thickness was not assessed. Right ventricular systolic function is normal. Left Atrium: Left atrial size was normal in size. Right Atrium: Right atrial size was mildly dilated. Pericardium: There is no evidence of pericardial effusion. Mitral Valve: The mitral valve is normal in structure. Mild mitral valve regurgitation. Tricuspid Valve: The tricuspid valve is normal in structure. Tricuspid valve regurgitation is trivial. Aortic Valve: The aortic valve was not well visualized. Aortic valve regurgitation is not visualized. Pulmonic Valve: The pulmonic valve was not well visualized. Pulmonic valve regurgitation is trivial. Aorta: The aortic root is normal in size and structure. IAS/Shunts: The interatrial septum was not well visualized.  LEFT VENTRICLE PLAX 2D LVIDd:         4.60 cm LVIDs:         3.00 cm LV PW:         1.00 cm LV IVS:        0.80 cm LVOT diam:     1.90 cm LV SV:         47 LV SV Index:   24 LVOT Area:     2.84 cm  RIGHT VENTRICLE RV S prime:     13.90 cm/s TAPSE (M-mode): 1.7 cm LEFT ATRIUM           Index       RIGHT ATRIUM           Index LA diam:      4.00 cm 2.07 cm/m  RA Area:     21.50 cm LA Vol (A4C): 63.8 ml 32.99 ml/m RA Volume:   63.10 ml  32.63 ml/m  AORTIC VALVE LVOT Vmax:   95.30 cm/s LVOT Vmean:  68.500 cm/s LVOT VTI:    0.167 m  AORTA Ao Root diam: 2.60 cm MITRAL VALVE               TRICUSPID VALVE MV Area (PHT): 2.96 cm    TR Peak grad:   25.2 mmHg MV Decel Time: 256 msec    TR Vmax:        251.00 cm/s MV E velocity: 90.00 cm/s                            SHUNTS                            Systemic VTI:  0.17 m                             Systemic Diam: 1.90 cm Oswaldo Milian MD Electronically signed by Oswaldo Milian MD Signature Date/Time: 12/28/2019/11:03:43 AM    Final    US Abdomen Limited RUQ  Result Date: 12/28/2019 CLINICAL DATA:  Elevated liver enzymes EXAM: ULTRASOUND ABDOMEN LIMITED RIGHT UPPER QUADRANT COMPARISON:  None. FINDINGS: Gallbladder: No gallstones or wall thickening visualized. There is no pericholecystic fluid. No sonographic Murphy sign noted by sonographer. Common bile duct: Diameter: 3 mm. No intrahepatic or extrahepatic biliary duct dilatation. Liver: No focal lesion identified. Within normal limits in parenchymal echogenicity. Portal vein is patent on color Doppler imaging with normal direction of blood flow towards the liver. Other: There is a right pleural effusion. IMPRESSION: Right pleural effusion.  Study otherwise unremarkable. Electronically Signed   By: Lowella Grip III M.D.   On: 12/28/2019 07:59    Cardiac Studies  TTE 12/28/2019 1. Technically difficult study due to atrial fibrillation and poor  windows. Grossly normal LV systolic function. Left ventricular ejection  fraction, by estimation, is 55 to 60%. The left ventricle has normal  function. The left ventricle has no regional  wall motion abnormalities. There is mild left ventricular hypertrophy.  Left ventricular diastolic parameters are indeterminate.  2. Right ventricular systolic function is normal. The right ventricular  size is mildly enlarged.  3. Right atrial size was mildly dilated.  4. The mitral valve is normal in structure. Mild mitral valve  regurgitation.  5. The aortic valve was not well visualized. Aortic valve regurgitation  is not visualized. AV gradients were not measured   Patient Profile  Ms. Welt is a 49 year old female without significant medical history who was admitted with new onset atrial fibrillation with RVR as well as acute decompensated diastolic heart failure.  Assessment & Plan    1. New onset atrial fibrillation with RVR:  -Suspect this is been going on for at least 1 month.  Developed a bit of volume overload from this. -A1c 5.5.  TSH 5.2, free T4 normal -Echo with EF 50-55% normal left atrial size. -Likely has some alcohol abuse she was dealing with.  Has stopped drinking for 1 month.  No signs of withdrawal.  She will refrain from alcohol moving forward. -I suspect she also has untreated sleep apnea.  We will plan to have this evaluated as an outpatient. -Unable to perform TEE/cardioversion today.  Schedule is full.  She is scheduled for tomorrow at 7:30 AM.  I have transitioned her to Eliquis. -She will remain on diltiazem drip until we cardiovert her. -He may not need antirhythm medications and we will hold on this for now.  2.  Acute diastolic heart failure -I have suspect this was related to rapid A. fib.  Her rates were in the 190s on admission.  She has been given Lasix with good urine output. -We will repeat a dose of Lasix today.  I do not anticipate she needs standing Lasix moving forward.  We will likely discharge her home with Lasix as needed.  For questions or updates, please contact [place] Please consult [email or web address] for contact info under   Time Spent with Patient: I have spent a total of 35 minutes with patient reviewing hospital notes, telemetry, EKGs, labs and examining the patient as well as establishing an assessment and plan that was discussed with the patient.  > 50% of time was spent in direct patient care.    Signed, Addison Naegeli. Audie Box, [place]  12/29/2019 9:35 AM

## 2019-12-29 NOTE — Progress Notes (Signed)
PROGRESS NOTE    Cindy Christian  [id number] DOB: 1971-09-18 DOA: 12/26/2019 PCP: Ann Held, DO   Brief Narrative:  49 year old Caucasian female with past medical history significant for obesity and significant alcohol use.  Patient was admitted with atrial fibrillation with rapid ventricular response.    Echocardiogram showed normal EF, elevated BNP.  CTA chest negative for PE.  Right upper quadrant is negative for acute cholecystitis.  Currently on Cardizem drip.  Cardiology team following planning for cardioversion.   Assessment & Plan:   Active Problems:   Atrial fibrillation with RVR (HCC)   A-fib (HCC)  Atrial fibrillation with RVR, rate controlled now. Dyspnea on exertion with elevated BNP with pulmonary edema -Responded well to IV Lasix.  Cardizem drip for rate control, transitioning to Eliquis for anticoagulation -Plans for cardioversion tomorrow -Echocardiogram shows EF of 50-55%.  Acute diastolic congestive heart failure with preserved ejection fraction, 55% -Diuresed well with IV Lasix.  History of alcohol use -Monitor for any signs of withdrawal  Hypokalemia -Improved  Transaminitis -Suspect from congestion alcohol use.  Ultrasound-negative  Obesity Body mass index is 33.79 kg/m.   DVT prophylaxis: Eliquis Code Status: Full code Family Communication: Family at bedside Disposition Plan:   Patient From= home  Patient Anticipated D/C place= Home  Barriers= maintain hospital stay until cardioversion tomorrow currently on heparin drip for rate control.  Difficult to control rate on p.o. medications.     Subjective: Remains in A. fib but rate is somewhat controlled on Cardizem drip at this time.  Shortness of breath is better.  Review of Systems Otherwise negative except as per HPI, including: General: Denies fever, chills, night sweats or unintended weight loss. Resp: Denies cough, wheezing, shortness of breath. Cardiac: Denies chest pain,  palpitations, orthopnea, paroxysmal nocturnal dyspnea. GI: Denies abdominal pain, nausea, vomiting, diarrhea or constipation GU: Denies dysuria, frequency, hesitancy or incontinence MS: Denies muscle aches, joint pain or swelling Neuro: Denies headache, neurologic deficits (focal weakness, numbness, tingling), abnormal gait Psych: Denies anxiety, depression, SI/HI/AVH Skin: Denies new rashes or lesions ID: Denies sick contacts, exotic exposures, travel  Examination:  General exam: Appears calm and comfortable  Respiratory system: Clear to auscultation. Respiratory effort normal. Cardiovascular system: S1 & S2 heard, irregularly irregular. No JVD, murmurs, rubs, gallops or clicks. No pedal edema. Gastrointestinal system: Abdomen is nondistended, soft and nontender. No organomegaly or masses felt. Normal bowel sounds heard. Central nervous system: Alert and oriented. No focal neurological deficits. Extremities: Symmetric 5 x 5 power. Skin: No rashes, lesions or ulcers Psychiatry: Judgement and insight appear normal. Mood & affect appropriate.     Objective: Vitals:   12/28/19 1608 12/28/19 1925 12/28/19 2352 12/29/19 0555  BP: 106/76 (!) 124/95 90/68 110/82  Pulse: (!) 105 (!) 126 91 77  Resp: 18 (!) 24 19 18   Temp: 98.3 F (36.8 C) 98 F (36.7 C) 98.1 F (36.7 C) 97.7 F (36.5 C)  TempSrc: Oral Oral Oral Oral  SpO2: 98%  100% 99%  Weight:    89.3 kg  Height:        Intake/Output Summary (Last 24 hours) at 12/29/2019 0729 Last data filed at 12/29/2019 [id number] Gross per 24 hour  Intake 2107.41 ml  Output 1800 ml  Net 307.41 ml   Filed Weights   12/26/19 2001 12/27/19 1818 12/29/19 0555  Weight: 88.5 kg 88.3 kg 89.3 kg     Data Reviewed:   CBC: Recent Labs  Lab 12/28/19 0246 12/29/19 0217  WBC 8.2  6.9  HGB 11.5* 11.6*  HCT 35.1* 36.5  MCV 95.6 97.6  PLT 251 [id number]   Basic Metabolic Panel: Recent Labs  Lab 12/26/19 1957 12/28/19 0246 12/28/19 1559  12/29/19 0217  NA 137 140 136 140  K 3.6 2.8* 3.7 4.2  CL 106 104 106 110  CO2 20* 23 22 23   GLUCOSE 132* 101* 125* 106*  BUN 15 5* <5* <5*  CREATININE 0.90 0.69 0.65 0.62  CALCIUM 9.3 8.7* 8.6* 8.6*  MG  --  1.6*  --  1.9  PHOS  --  4.4 3.0 3.3   GFR: Estimated Creatinine Clearance: 91 mL/min (by C-G formula based on SCr of 0.62 mg/dL). Liver Function Tests: Recent Labs  Lab 12/26/19 1957 12/28/19 1559 12/29/19 0217  AST 63*  --   --   ALT 58*  --   --   ALKPHOS 52  --   --   BILITOT 0.9  --   --   PROT 7.2  --   --   ALBUMIN 3.9 3.6 3.3*   Recent Labs  Lab 12/26/19 2248  LIPASE 29   No results for input(s): AMMONIA in the last 168 hours. Coagulation Profile: No results for input(s): INR, PROTIME in the last 168 hours. Cardiac Enzymes: No results for input(s): CKTOTAL, CKMB, CKMBINDEX, TROPONINI in the last 168 hours. BNP (last 3 results) No results for input(s): PROBNP in the last 8760 hours. HbA1C: Recent Labs    12/28/19 0246  HGBA1C 5.5   CBG: Recent Labs  Lab 12/28/19 1240 12/28/19 1713 12/29/19 0009  GLUCAP 83 102* 100*   Lipid Profile: No results for input(s): CHOL, HDL, LDLCALC, TRIG, CHOLHDL, LDLDIRECT in the last 72 hours. Thyroid Function Tests: Recent Labs    12/26/19 2026  TSH 5.209*  FREET4 1.11  T3FREE 3.6   Anemia Panel: No results for input(s): VITAMINB12, FOLATE, FERRITIN, TIBC, IRON, RETICCTPCT in the last 72 hours. Sepsis Labs: No results for input(s): PROCALCITON, LATICACIDVEN in the last 168 hours.  Recent Results (from the past 240 hour(s))  SARS CORONAVIRUS 2 (TAT 6-24 HRS) Nasopharyngeal Nasopharyngeal Swab     Status: None   Collection Time: 12/27/19 12:31 AM   Specimen: Nasopharyngeal Swab  Result Value Ref Range Status   SARS Coronavirus 2 NEGATIVE NEGATIVE Final    Comment: (NOTE) SARS-CoV-2 target nucleic acids are NOT DETECTED. The SARS-CoV-2 RNA is generally detectable in upper and lower respiratory  specimens during the acute phase of infection. Negative results do not preclude SARS-CoV-2 infection, do not rule out co-infections with other pathogens, and should not be used as the sole basis for treatment or other patient management decisions. Negative results must be combined with clinical observations, patient history, and epidemiological information. The expected result is Negative. Fact Sheet for Patients: SugarRoll.be Fact Sheet for Healthcare Providers: https://www.woods-mathews.com/ This test is not yet approved or cleared by the [place] FDA and  has been authorized for detection and/or diagnosis of SARS-CoV-2 by FDA under an Emergency Use Authorization (EUA). This EUA will remain  in effect (meaning this test can be used) for the duration of the COVID-19 declaration under Section 56 4(b)(1) of the Act, 21 U.S.C. section 360bbb-3(b)(1), unless the authorization is terminated or revoked sooner. Performed at [facility], [place] [address], [place], [place] [address]   MRSA PCR Screening     Status: None   Collection Time: 12/27/19  7:06 PM   Specimen: Nasopharyngeal  Result Value Ref Range Status  MRSA by PCR NEGATIVE NEGATIVE Final    Comment:        The GeneXpert MRSA Assay (FDA approved for NASAL specimens only), is one component of a comprehensive MRSA colonization surveillance program. It is not intended to diagnose MRSA infection nor to guide or monitor treatment for MRSA infections. Performed at [facility], [place] [address], [place], [place] [address]          Radiology Studies: ECHOCARDIOGRAM COMPLETE  Result Date: 12/28/2019    ECHOCARDIOGRAM REPORT   Patient Name:   EMREIGH BOTBYL Date of Exam: 12/28/2019 Medical Rec #:  [id number]   Height:       64.0 in Accession #:    [id number]  Weight:       194.6 lb Date of Birth:  September 05, 1971   BSA:          1.934 m Patient Age:    22 years    BP:            107/74 mmHg Patient Gender: F           HR:           97 bpm. Exam Location:  Inpatient Procedure: 2D Echo Indications:    atrial fibrillation  History:        Patient has no prior history of Echocardiogram examinations.                 Risk Factors:Former Smoker.  Sonographer:    Jannett Celestine RDCS (AE) Referring Phys: [id number] Lequita Halt IMPRESSIONS  1. Technically difficult study due to atrial fibrillation and poor windows. Grossly normal LV systolic function. Left ventricular ejection fraction, by estimation, is 55 to 60%. The left ventricle has normal function. The left ventricle has no regional wall motion abnormalities. There is mild left ventricular hypertrophy. Left ventricular diastolic parameters are indeterminate.  2. Right ventricular systolic function is normal. The right ventricular size is mildly enlarged.  3. Right atrial size was mildly dilated.  4. The mitral valve is normal in structure. Mild mitral valve regurgitation.  5. The aortic valve was not well visualized. Aortic valve regurgitation is not visualized. AV gradients were not measured FINDINGS  Left Ventricle: Left ventricular ejection fraction, by estimation, is 55 to 60%. The left ventricle has normal function. The left ventricle has no regional wall motion abnormalities. The left ventricular internal cavity size was normal in size. There is  mild left ventricular hypertrophy. Left ventricular diastolic parameters are indeterminate. Right Ventricle: The right ventricular size is mildly enlarged. Right vetricular wall thickness was not assessed. Right ventricular systolic function is normal. Left Atrium: Left atrial size was normal in size. Right Atrium: Right atrial size was mildly dilated. Pericardium: There is no evidence of pericardial effusion. Mitral Valve: The mitral valve is normal in structure. Mild mitral valve regurgitation. Tricuspid Valve: The tricuspid valve is normal in structure. Tricuspid valve regurgitation is trivial.  Aortic Valve: The aortic valve was not well visualized. Aortic valve regurgitation is not visualized. Pulmonic Valve: The pulmonic valve was not well visualized. Pulmonic valve regurgitation is trivial. Aorta: The aortic root is normal in size and structure. IAS/Shunts: The interatrial septum was not well visualized.  LEFT VENTRICLE PLAX 2D LVIDd:         4.60 cm LVIDs:         3.00 cm LV PW:         1.00 cm LV IVS:        0.80  cm LVOT diam:     1.90 cm LV SV:         47 LV SV Index:   24 LVOT Area:     2.84 cm  RIGHT VENTRICLE RV S prime:     13.90 cm/s TAPSE (M-mode): 1.7 cm LEFT ATRIUM           Index       RIGHT ATRIUM           Index LA diam:      4.00 cm 2.07 cm/m  RA Area:     21.50 cm LA Vol (A4C): 63.8 ml 32.99 ml/m RA Volume:   63.10 ml  32.63 ml/m  AORTIC VALVE LVOT Vmax:   95.30 cm/s LVOT Vmean:  68.500 cm/s LVOT VTI:    0.167 m  AORTA Ao Root diam: 2.60 cm MITRAL VALVE               TRICUSPID VALVE MV Area (PHT): 2.96 cm    TR Peak grad:   25.2 mmHg MV Decel Time: 256 msec    TR Vmax:        251.00 cm/s MV E velocity: 90.00 cm/s                            SHUNTS                            Systemic VTI:  0.17 m                            Systemic Diam: 1.90 cm Oswaldo Milian MD Electronically signed by Oswaldo Milian MD Signature Date/Time: 12/28/2019/11:03:43 AM    Final    US Abdomen Limited RUQ  Result Date: 12/28/2019 CLINICAL DATA:  Elevated liver enzymes EXAM: ULTRASOUND ABDOMEN LIMITED RIGHT UPPER QUADRANT COMPARISON:  None. FINDINGS: Gallbladder: No gallstones or wall thickening visualized. There is no pericholecystic fluid. No sonographic Murphy sign noted by sonographer. Common bile duct: Diameter: 3 mm. No intrahepatic or extrahepatic biliary duct dilatation. Liver: No focal lesion identified. Within normal limits in parenchymal echogenicity. Portal vein is patent on color Doppler imaging with normal direction of blood flow towards the liver. Other: There is a right  pleural effusion. IMPRESSION: Right pleural effusion.  Study otherwise unremarkable. Electronically Signed   By: Lowella Grip III M.D.   On: 12/28/2019 07:59        Scheduled Meds: . apixaban  5 mg Oral BID  . ascorbic acid  500 mg Oral Daily  . B-complex with vitamin C  1 tablet Oral Daily  . calcium-vitamin D  1 tablet Oral Daily  . sodium chloride flush  3 mL Intravenous Q12H  . zinc sulfate  220 mg Oral Daily   Continuous Infusions: . sodium chloride    . diltiazem (CARDIZEM) infusion 15 mg/hr (12/28/19 2231)     LOS: 3 days   Time spent= 35 mins    Tyree Fluharty Arsenio Loader, MD [facility]  If 7PM-7AM, please contact night-coverage  12/29/2019, 7:29 AM

## 2019-12-29 NOTE — Progress Notes (Signed)
Cardiology Progress Note  Patient ID: Cindy Christian MRN: [id number] DOB: 1970/10/26 Date of Encounter: 12/29/2019  Primary Cardiologist: No primary care provider on file.  Subjective  Remains in atrial fibrillation.  Unable to perform cardioversion today due to schedule.  Plan for 7:30 AM tomorrow.  ROS:  All other ROS reviewed and negative. Pertinent positives noted in the HPI.     Inpatient Medications  Scheduled Meds: . apixaban  5 mg Oral BID  . ascorbic acid  500 mg Oral Daily  . B-complex with vitamin C  1 tablet Oral Daily  . calcium-vitamin D  1 tablet Oral Daily  . furosemide  40 mg Intravenous Once  . sodium chloride flush  3 mL Intravenous Q12H  . zinc sulfate  220 mg Oral Daily   Continuous Infusions: . sodium chloride    . diltiazem (CARDIZEM) infusion 15 mg/hr (12/28/19 2231)   PRN Meds: sodium chloride, acetaminophen, ondansetron (ZOFRAN) IV, sodium chloride flush   Vital Signs   Vitals:   12/28/19 1925 12/28/19 2352 12/29/19 0555 12/29/19 0748  BP: (!) 124/95 90/68 110/82 104/81  Pulse: (!) 126 91 77 87  Resp: (!) 24 19 18  (!) 22  Temp: 98 F (36.7 C) 98.1 F (36.7 C) 97.7 F (36.5 C) 97.9 F (36.6 C)  TempSrc: Oral Oral Oral Oral  SpO2:  100% 99%   Weight:   89.3 kg   Height:        Intake/Output Summary (Last 24 hours) at 12/29/2019 0935 Last data filed at 12/29/2019 [id number] Gross per 24 hour  Intake 1691.98 ml  Output 900 ml  Net 791.98 ml   Last 3 Weights 12/29/2019 12/27/2019 12/26/2019  Weight (lbs) 196 lb 13.9 oz 194 lb 9.6 oz 195 lb  Weight (kg) 89.3 kg 88.27 kg 88.451 kg      Telemetry  Overnight telemetry shows atrial fibrillation with heart rate in the 90-110 range, which I personally reviewed.   ECG  The most recent ECG shows A. fib with RVR, heart rate 189, which I personally reviewed.   Physical Exam   Vitals:   12/28/19 1925 12/28/19 2352 12/29/19 0555 12/29/19 0748  BP: (!) 124/95 90/68 110/82 104/81  Pulse: (!) 126 91 77 87   Resp: (!) 24 19 18  (!) 22  Temp: 98 F (36.7 C) 98.1 F (36.7 C) 97.7 F (36.5 C) 97.9 F (36.6 C)  TempSrc: Oral Oral Oral Oral  SpO2:  100% 99%   Weight:   89.3 kg   Height:         Intake/Output Summary (Last 24 hours) at 12/29/2019 0935 Last data filed at 12/29/2019 [id number] Gross per 24 hour  Intake 1691.98 ml  Output 900 ml  Net 791.98 ml    Last 3 Weights 12/29/2019 12/27/2019 12/26/2019  Weight (lbs) 196 lb 13.9 oz 194 lb 9.6 oz 195 lb  Weight (kg) 89.3 kg 88.27 kg 88.451 kg    Body mass index is 33.79 kg/m.  General: Well nourished, well developed, in no acute distress Head: Atraumatic, normal size  Eyes: PEERLA, EOMI  Neck: Supple, no JVD Endocrine: No thryomegaly Cardiac: Irregular rhythm, no murmurs rubs or gallops Lungs: Clear to auscultation bilaterally, no wheezing, rhonchi or rales  Abd: Soft, nontender, no hepatomegaly  Ext: Trace edema Musculoskeletal: No deformities, BUE and BLE strength normal and equal Skin: Warm and dry, no rashes   Neuro: Alert and oriented to person, place, time, and situation, CNII-XII grossly intact, no focal deficits  Psych:  Normal mood and affect   Labs  High Sensitivity Troponin:   Recent Labs  Lab 12/26/19 1957 12/26/19 2240  TROPONINIHS 7 7     Cardiac EnzymesNo results for input(s): TROPONINI in the last 168 hours. No results for input(s): TROPIPOC in the last 168 hours.  Chemistry Recent Labs  Lab 12/26/19 1957 12/26/19 1957 12/28/19 0246 12/28/19 1559 12/29/19 0217  NA 137   < > 140 136 140  K 3.6   < > 2.8* 3.7 4.2  CL 106   < > 104 106 110  CO2 20*   < > 23 22 23   GLUCOSE 132*   < > 101* 125* 106*  BUN 15   < > 5* <5* <5*  CREATININE 0.90   < > 0.69 0.65 0.62  CALCIUM 9.3   < > 8.7* 8.6* 8.6*  PROT 7.2  --   --   --   --   ALBUMIN 3.9  --   --  3.6 3.3*  AST 63*  --   --   --   --   ALT 58*  --   --   --   --   ALKPHOS 52  --   --   --   --   BILITOT 0.9  --   --   --   --   GFRNONAA >60   < > >60 >60  >60  GFRAA >60   < > >60 >60 >60  ANIONGAP 11   < > 13 8 7    < > = values in this interval not displayed.    Hematology Recent Labs  Lab 12/28/19 0246 12/29/19 0217  WBC 8.2 6.9  RBC 3.67* 3.74*  HGB 11.5* 11.6*  HCT 35.1* 36.5  MCV 95.6 97.6  MCH 31.3 31.0  MCHC 32.8 31.8  RDW 12.9 13.2  PLT 251 255   BNP Recent Labs  Lab 12/26/19 1957  BNP 179.7*    DDimer  Recent Labs  Lab 12/26/19 1957  DDIMER 1.19*     Radiology  ECHOCARDIOGRAM COMPLETE  Result Date: 12/28/2019    ECHOCARDIOGRAM REPORT   Patient Name:   Cindy Christian Date of Exam: 12/28/2019 Medical Rec #:  [id number]   Height:       64.0 in Accession #:    [id number]  Weight:       194.6 lb Date of Birth:  01-03-1971   BSA:          1.934 m Patient Age:    49 years    BP:           107/74 mmHg Patient Gender: F           HR:           97 bpm. Exam Location:  Inpatient Procedure: 2D Echo Indications:    atrial fibrillation  History:        Patient has no prior history of Echocardiogram examinations.                 Risk Factors:Former Smoker.  Sonographer:    Cindy Christian RDCS (AE) Referring Phys: [id number] Lequita Halt IMPRESSIONS  1. Technically difficult study due to atrial fibrillation and poor windows. Grossly normal LV systolic function. Left ventricular ejection fraction, by estimation, is 55 to 60%. The left ventricle has normal function. The left ventricle has no regional wall motion abnormalities. There is mild left ventricular hypertrophy. Left ventricular diastolic parameters are indeterminate.  2. Right ventricular  systolic function is normal. The right ventricular size is mildly enlarged.  3. Right atrial size was mildly dilated.  4. The mitral valve is normal in structure. Mild mitral valve regurgitation.  5. The aortic valve was not well visualized. Aortic valve regurgitation is not visualized. AV gradients were not measured FINDINGS  Left Ventricle: Left ventricular ejection fraction, by estimation, is 55 to 60%.  The left ventricle has normal function. The left ventricle has no regional wall motion abnormalities. The left ventricular internal cavity size was normal in size. There is  mild left ventricular hypertrophy. Left ventricular diastolic parameters are indeterminate. Right Ventricle: The right ventricular size is mildly enlarged. Right vetricular wall thickness was not assessed. Right ventricular systolic function is normal. Left Atrium: Left atrial size was normal in size. Right Atrium: Right atrial size was mildly dilated. Pericardium: There is no evidence of pericardial effusion. Mitral Valve: The mitral valve is normal in structure. Mild mitral valve regurgitation. Tricuspid Valve: The tricuspid valve is normal in structure. Tricuspid valve regurgitation is trivial. Aortic Valve: The aortic valve was not well visualized. Aortic valve regurgitation is not visualized. Pulmonic Valve: The pulmonic valve was not well visualized. Pulmonic valve regurgitation is trivial. Aorta: The aortic root is normal in size and structure. IAS/Shunts: The interatrial septum was not well visualized.  LEFT VENTRICLE PLAX 2D LVIDd:         4.60 cm LVIDs:         3.00 cm LV PW:         1.00 cm LV IVS:        0.80 cm LVOT diam:     1.90 cm LV SV:         47 LV SV Index:   24 LVOT Area:     2.84 cm  RIGHT VENTRICLE RV S prime:     13.90 cm/s TAPSE (M-mode): 1.7 cm LEFT ATRIUM           Index       RIGHT ATRIUM           Index LA diam:      4.00 cm 2.07 cm/m  RA Area:     21.50 cm LA Vol (A4C): 63.8 ml 32.99 ml/m RA Volume:   63.10 ml  32.63 ml/m  AORTIC VALVE LVOT Vmax:   95.30 cm/s LVOT Vmean:  68.500 cm/s LVOT VTI:    0.167 m  AORTA Ao Root diam: 2.60 cm MITRAL VALVE               TRICUSPID VALVE MV Area (PHT): 2.96 cm    TR Peak grad:   25.2 mmHg MV Decel Time: 256 msec    TR Vmax:        251.00 cm/s MV E velocity: 90.00 cm/s                            SHUNTS                            Systemic VTI:  0.17 m                             Systemic Diam: 1.90 cm Oswaldo Milian MD Electronically signed by Oswaldo Milian MD Signature Date/Time: 12/28/2019/11:03:43 AM    Final    US Abdomen Limited RUQ  Result Date: 12/28/2019 CLINICAL DATA:  Elevated liver enzymes EXAM: ULTRASOUND ABDOMEN LIMITED RIGHT UPPER QUADRANT COMPARISON:  None. FINDINGS: Gallbladder: No gallstones or wall thickening visualized. There is no pericholecystic fluid. No sonographic Murphy sign noted by sonographer. Common bile duct: Diameter: 3 mm. No intrahepatic or extrahepatic biliary duct dilatation. Liver: No focal lesion identified. Within normal limits in parenchymal echogenicity. Portal vein is patent on color Doppler imaging with normal direction of blood flow towards the liver. Other: There is a right pleural effusion. IMPRESSION: Right pleural effusion.  Study otherwise unremarkable. Electronically Signed   By: Lowella Grip III M.D.   On: 12/28/2019 07:59    Cardiac Studies  TTE 12/28/2019 1. Technically difficult study due to atrial fibrillation and poor  windows. Grossly normal LV systolic function. Left ventricular ejection  fraction, by estimation, is 55 to 60%. The left ventricle has normal  function. The left ventricle has no regional  wall motion abnormalities. There is mild left ventricular hypertrophy.  Left ventricular diastolic parameters are indeterminate.  2. Right ventricular systolic function is normal. The right ventricular  size is mildly enlarged.  3. Right atrial size was mildly dilated.  4. The mitral valve is normal in structure. Mild mitral valve  regurgitation.  5. The aortic valve was not well visualized. Aortic valve regurgitation  is not visualized. AV gradients were not measured   Patient Profile  Ms. Pengelly is a 49 year old female without significant medical history who was admitted with new onset atrial fibrillation with RVR as well as acute decompensated diastolic heart failure.  Assessment & Plan    1. New onset atrial fibrillation with RVR:  -Suspect this is been going on for at least 1 month.  Developed a bit of volume overload from this. -A1c 5.5.  TSH 5.2, free T4 normal -Echo with EF 50-55% normal left atrial size. -Likely has some alcohol abuse she was dealing with.  Has stopped drinking for 1 month.  No signs of withdrawal.  She will refrain from alcohol moving forward. -I suspect she also has untreated sleep apnea.  We will plan to have this evaluated as an outpatient. -Unable to perform TEE/cardioversion today.  Schedule is full.  She is scheduled for tomorrow at 7:30 AM.  I have transitioned her to Eliquis. -She will remain on diltiazem drip until we cardiovert her. -He may not need antirhythm medications and we will hold on this for now.  2.  Acute diastolic heart failure -I have suspect this was related to rapid A. fib.  Her rates were in the 190s on admission.  She has been given Lasix with good urine output. -We will repeat a dose of Lasix today.  I do not anticipate she needs standing Lasix moving forward.  We will likely discharge her home with Lasix as needed.  For questions or updates, please contact [place] Please consult [email or web address] for contact info under   Time Spent with Patient: I have spent a total of 35 minutes with patient reviewing hospital notes, telemetry, EKGs, labs and examining the patient as well as establishing an assessment and plan that was discussed with the patient.  > 50% of time was spent in direct patient care.    Signed, Addison Naegeli. Audie Box, [place]  12/29/2019 9:35 AM

## 2019-12-29 NOTE — Progress Notes (Signed)
Transitions of Care Pharmacist Note  Anique Hickel is a 49 y.o. female that has been diagnosed with A Fib and will be prescribed Eliquis (apixaban) at discharge.   Patient Education: I provided the following education on apixaban to the patient: How to take the medication Described what the medication is Signs of bleeding Signs/symptoms of VTE and stroke  Answered their questions  Discharge Medications Plan: The patient wants to have their discharge medications filled by the Transitions of Care pharmacy rather than their usual pharmacy.  The discharge orders pharmacy has been changed to the Transitions of Care pharmacy, the patient will receive a phone call regarding co-pay, and their medications will be delivered by the [facility] pharmacy.    Thank you,   Cristela Felt, PharmD PGY1 Pharmacy Resident  December 29, 2019

## 2019-12-29 NOTE — Plan of Care (Signed)
  Problem: Health Behavior/Discharge Planning: Goal: Ability to safely manage health-related needs after discharge will improve Outcome: Progressing   Problem: Education: Goal: Knowledge of General Education information will improve Description: Including pain rating scale, medication(s)/side effects and non-pharmacologic comfort measures Outcome: Progressing   Problem: Health Behavior/Discharge Planning: Goal: Ability to manage health-related needs will improve Outcome: Progressing   Problem: Clinical Measurements: Goal: Ability to maintain clinical measurements within normal limits will improve Outcome: Progressing Goal: Will remain free from infection Outcome: Progressing Goal: Diagnostic test results will improve Outcome: Progressing Goal: Respiratory complications will improve Outcome: Progressing Goal: Cardiovascular complication will be avoided Outcome: Progressing   Problem: Activity: Goal: Risk for activity intolerance will decrease Outcome: Progressing

## 2019-12-29 NOTE — Anesthesia Preprocedure Evaluation (Addendum)
Anesthesia Evaluation  Patient identified by MRN, date of birth, ID band Patient awake    Reviewed: Allergy & Precautions, NPO status , Patient's Chart, lab work & pertinent test results  Airway Mallampati: I       Dental no notable dental hx. (+) Teeth Intact   Pulmonary neg pulmonary ROS, former smoker,    Pulmonary exam normal        Cardiovascular + dysrhythmias Atrial Fibrillation  Rhythm:Irregular Rate:Normal     Neuro/Psych negative neurological ROS  negative psych ROS   GI/Hepatic negative GI ROS, Neg liver ROS,   Endo/Other  Hypothyroidism   Renal/GU negative Renal ROS     Musculoskeletal negative musculoskeletal ROS (+)   Abdominal Normal abdominal exam  (+)   Peds negative pediatric ROS (+)  Hematology negative hematology ROS (+)   Anesthesia Other Findings 1. Technically difficult study due to atrial fibrillation and poor  windows. Grossly normal LV systolic function. Left ventricular ejection  fraction, by estimation, is 55 to 60%. The left ventricle has normal  function. The left ventricle has no regional  wall motion abnormalities. There is mild left ventricular hypertrophy.  Left ventricular diastolic parameters are indeterminate.  2. Right ventricular systolic function is normal. The right ventricular  size is mildly enlarged.  3. Right atrial size was mildly dilated.  4. The mitral valve is normal in structure. Mild mitral valve  regurgitation.  5. The aortic valve was not well visualized. Aortic valve regurgitation  is not visualized. AV gradients were not measured   FINDINGS  Left Ventricle: Left ventricular ejection fraction, by estimation, is 55  to 60%. The left ventricle has normal function. The left ventricle has no  regional wall motion abnormalities. The left ventricular internal cavity  size was normal in size. There is  mild left ventricular hypertrophy. Left ventricular  diastolic parameters  are indeterminate.   Right Ventricle: The right ventricular size is mildly enlarged. Right  vetricular wall thickness was not assessed. Right ventricular systolic  function is normal.   Left Atrium: Left atrial size was normal in size.   Right Atrium: Right atrial size was mildly dilated.   Pericardium: There is no evidence of pericardial effusion.   Mitral Valve: The mitral valve is normal in structure. Mild mitral valve  regurgitation.   Tricuspid Valve: The tricuspid valve is normal in structure. Tricuspid  valve regurgitation is trivial.   Aortic Valve: The aortic valve was not well visualized. Aortic valve  regurgitation is not visualized.   Pulmonic Valve: The pulmonic valve was not well visualized. Pulmonic valve  regurgitation is trivial.   Aorta: The aortic root is normal in size and structure.   IAS/Shunts: The interatrial septum was not well visualized.     LEFT VENTRICLE  PLAX 2D  LVIDd:     4.60 cm  LVIDs:     3.00 cm  LV PW:     1.00 cm  LV IVS:    0.80 cm  LVOT diam:   1.90 cm  LV SV:     47  LV SV Index:  24  LVOT Area:   2.84 cm     RIGHT VENTRICLE  RV S prime:   13.90 cm/s  TAPSE (M-mode): 1.7 cm   LEFT ATRIUM      Index    RIGHT ATRIUM      Index  LA diam:   4.00 cm 2.07 cm/m RA Area:   21.50 cm  LA Vol (A4C): 63.8 ml  32.99 ml/m RA Volume:  63.10 ml 32.63 ml/m  AORTIC VALVE  LVOT Vmax:  95.30 cm/s  LVOT Vmean: 68.500 cm/s  LVOT VTI:  0.167 m    AORTA  Ao Root diam: 2.60 cm   MITRAL VALVE        TRICUSPID VALVE  MV Area (PHT): 2.96 cm  TR Peak grad:  25.2 mmHg  MV Decel Time: 256 msec  TR Vmax:    251.00 cm/s  MV E velocity: 90.00 cm/s               SHUNTS               Systemic VTI: 0.17 m               Systemic Diam: 1.90 cm   Oswaldo Milian MD  Electronically signed by Oswaldo Milian MD  Signature Date/Time: 12/28/2019/11:03:43 AM      Final  Imaging Info  ECHOCARDIOGRAM COMPLETE (Order [id number]) on 12/27/19 Study History  ECHOCARDIOGRAM COMPLETE (Order [id number]) on 12/27/19 [employer] Images  Show images for ECHOCARDIOGRAM COMPLETE Images on [employer]  Show images for Joneen Roach Performing Technologist/Nurse  Performing Technologist/Nurse: Jannett Celestine Reason for Exam Priority: Routine Not on file Patient Data  Height  64 in  BP  107/74 mmHg               Surgical History  Surgical History   No past medical history on file.  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 Encounter-Level Documents - 12/26/2019:  Scan on 12/28/2019 1:56 PM by Default, Provider, MD Scan on 12/28/2019 10:38 AM by Default, Provider, MD Electronic signature on 12/28/2019 9:35 AM - E-signed Scan on 12/29/2019 9:38 AM by Default, Provider, MD Scan on 12/29/2019 5:11 PM by Default, Provider, MD Document on 12/27/2019 3:19 PM by Rex Kras, Wenda Overland, MD: ED PB Billing Extract Electronic signature on 12/27/2019 2:47 PM - 2 of 7 e-signatures recorded   Order-Level Documents - 12/26/2019:  Scan on 12/28/2019 11:04 AM by Default, Provider, MD   Resulted by:  Signed Date/Time  Phone Pager Gardiner Rhyme, [place] 12/28/2019 11:03 AM [id number]  External Result Report   External Result Report      Reproductive/Obstetrics                            Anesthesia Physical Anesthesia Plan  ASA: II  Anesthesia Plan: General   Post-op Pain Management:    Induction:   PONV Risk Score and Plan: 3 and Ondansetron  Airway Management Planned: Natural Airway and Mask  Additional Equipment: TEE  Intra-op Plan:    Post-operative Plan:   Informed Consent: I have reviewed the patients History and Physical, chart, labs and discussed the procedure including the risks, benefits and alternatives for the proposed anesthesia with the patient or authorized representative who has indicated his/her understanding and acceptance.     Dental advisory given  Plan Discussed with: CRNA  Anesthesia Plan Comments:        Anesthesia Quick Evaluation

## 2019-12-30 ENCOUNTER — Inpatient Hospital Stay (HOSPITAL_COMMUNITY): Payer: Self-pay | Admitting: Anesthesiology

## 2019-12-30 ENCOUNTER — Encounter (HOSPITAL_COMMUNITY)
Admission: EM | Disposition: A | Discharge status: Home / Self Care | Payer: Self-pay | Source: Home / Self Care | Attending: Internal Medicine

## 2019-12-30 ENCOUNTER — Encounter (HOSPITAL_COMMUNITY): Payer: Self-pay | Admitting: Internal Medicine

## 2019-12-30 ENCOUNTER — Inpatient Hospital Stay (HOSPITAL_COMMUNITY): Payer: Self-pay

## 2019-12-30 DIAGNOSIS — I340 Nonrheumatic mitral (valve) insufficiency: Secondary | ICD-10-CM

## 2019-12-30 DIAGNOSIS — I361 Nonrheumatic tricuspid (valve) insufficiency: Secondary | ICD-10-CM

## 2019-12-30 DIAGNOSIS — Z7289 Other problems related to lifestyle: Secondary | ICD-10-CM

## 2019-12-30 HISTORY — PX: BUBBLE STUDY: SHX6837

## 2019-12-30 HISTORY — PX: CARDIOVERSION: SHX1299

## 2019-12-30 HISTORY — PX: TEE WITHOUT CARDIOVERSION: SHX5443

## 2019-12-30 LAB — BASIC METABOLIC PANEL
Anion gap: 8 (ref 5–15)
BUN: <5 mg/dL — ABNORMAL LOW (ref 6–20)
CO2: 22 mmol/L (ref 22–32)
Calcium: 8.7 mg/dL — ABNORMAL LOW (ref 8.9–10.3)
Chloride: 107 mmol/L (ref 98–111)
Creatinine, Ser: 0.67 mg/dL (ref 0.44–1.00)
GFR calc Af Amer: >60 mL/min (ref >60)
GFR calc non Af Amer: >60 mL/min (ref >60)
Glucose, Bld: 88 mg/dL (ref 70–99)
Potassium: 3.7 mmol/L (ref 3.5–5.1)
Sodium: 137 mmol/L (ref 135–145)

## 2019-12-30 LAB — CBC
HCT: 37.9 % (ref 36.0–46.0)
Hemoglobin: 12 g/dL (ref 12.0–15.0)
MCH: 30.8 pg (ref 26.0–34.0)
MCHC: 31.7 g/dL (ref 30.0–36.0)
MCV: 97.4 fL (ref 80.0–100.0)
Platelets: 275 10*3/uL (ref 150–400)
RBC: 3.89 MIL/uL (ref 3.87–5.11)
RDW: 13.1 % (ref 11.5–15.5)
WBC: 5.2 10*3/uL (ref 4.0–10.5)
nRBC: 0 % (ref 0.0–0.2)

## 2019-12-30 LAB — MAGNESIUM: Magnesium: 1.9 mg/dL (ref 1.7–2.4)

## 2019-12-30 LAB — GLUCOSE, CAPILLARY
Glucose-Capillary: 103 mg/dL — ABNORMAL HIGH (ref 70–99)
Glucose-Capillary: 88 mg/dL (ref 70–99)

## 2019-12-30 SURGERY — ECHOCARDIOGRAM, TRANSESOPHAGEAL
Anesthesia: General

## 2019-12-30 MED ORDER — FLECAINIDE ACETATE 100 MG PO TABS
300.0000 mg | ORAL_TABLET | Freq: Once | ORAL | Status: AC
  Administered 2019-12-30: 300 mg via ORAL
  Filled 2019-12-30: qty 3

## 2019-12-30 MED ORDER — BUTAMBEN-TETRACAINE-BENZOCAINE 2-2-14 % EX AERO
INHALATION_SPRAY | CUTANEOUS | Status: DC | PRN
  Administered 2019-12-30: 2 via TOPICAL

## 2019-12-30 MED ORDER — PHENYLEPHRINE 40 MCG/ML (10ML) SYRINGE FOR IV PUSH (FOR BLOOD PRESSURE SUPPORT)
PREFILLED_SYRINGE | INTRAVENOUS | Status: DC | PRN
  Administered 2019-12-30 (×4): 80 ug via INTRAVENOUS

## 2019-12-30 MED ORDER — FLECAINIDE ACETATE 100 MG PO TABS
100.0000 mg | ORAL_TABLET | Freq: Two times a day (BID) | ORAL | Status: DC
  Administered 2019-12-30 – 2019-12-31 (×2): 100 mg via ORAL
  Filled 2019-12-30 (×2): qty 1

## 2019-12-30 MED ORDER — DOCUSATE SODIUM 100 MG PO CAPS: 200.0000 mg | ORAL_CAPSULE | Freq: Every day | ORAL | Status: DC

## 2019-12-30 MED ORDER — EPHEDRINE SULFATE-NACL 50-0.9 MG/10ML-% IV SOSY
PREFILLED_SYRINGE | INTRAVENOUS | Status: DC | PRN
  Administered 2019-12-30: 5 mg via INTRAVENOUS

## 2019-12-30 MED ORDER — LIDOCAINE 2% (20 MG/ML) 5 ML SYRINGE
INTRAMUSCULAR | Status: DC | PRN
  Administered 2019-12-30: 80 mg via INTRAVENOUS

## 2019-12-30 MED ORDER — PROPOFOL 10 MG/ML IV BOLUS
INTRAVENOUS | Status: DC | PRN
  Administered 2019-12-30: 30 mg via INTRAVENOUS

## 2019-12-30 MED ORDER — DILTIAZEM HCL 60 MG PO TABS
90.0000 mg | ORAL_TABLET | Freq: Four times a day (QID) | ORAL | Status: DC
  Administered 2019-12-30 – 2019-12-31 (×4): 90 mg via ORAL
  Filled 2019-12-30 (×4): qty 2

## 2019-12-30 MED ORDER — PROPOFOL 500 MG/50ML IV EMUL
INTRAVENOUS | Status: DC | PRN
  Administered 2019-12-30: 75 ug/kg/min via INTRAVENOUS

## 2019-12-30 MED ORDER — POTASSIUM CHLORIDE CRYS ER 20 MEQ PO TBCR
40.0000 meq | EXTENDED_RELEASE_TABLET | Freq: Once | ORAL | Status: AC
  Administered 2019-12-30: 40 meq via ORAL
  Filled 2019-12-30: qty 2

## 2019-12-30 MED ORDER — FLEET ENEMA 7-19 GM/118ML RE ENEM
1.0000 | ENEMA | Freq: Once | RECTAL | Status: AC
  Administered 2019-12-30: 1 via RECTAL
  Filled 2019-12-30: qty 1

## 2019-12-30 NOTE — CV Procedure (Signed)
INDICATIONS: afib r/o LAA thrombus  PROCEDURE:   Informed consent was obtained prior to the procedure. The risks, benefits and alternatives for the procedure were discussed and the patient comprehended these risks.  Risks include, but are not limited to, cough, sore throat, vomiting, nausea, somnolence, esophageal and stomach trauma or perforation, bleeding, low blood pressure, aspiration, pneumonia, infection, trauma to the teeth and death.    After a procedural time-out, the oropharynx was anesthetized with 20% benzocaine spray.   During this procedure the patient was administered propofol per anesthesia for deep monitored sedation.  The patient's heart rate, blood pressure, and oxygen saturationweare monitored continuously during the procedure. The period of conscious sedation was 35 minutes, of which I was present face-to-face 100% of this time.  The transesophageal probe was inserted in the esophagus and stomach without difficulty and multiple views were obtained.  The patient was kept under observation until the patient left the procedure room.  The patient left the procedure room in stable condition.   Agitated microbubble saline contrast was administered.  COMPLICATIONS:    There were no immediate complications.  FINDINGS:  No LA or LAA thrombus. No LV apical thrombus.  Mild-moderate mitral valve regurgitation Mild tricuspid valve regurgitation.  Trileaflet aortic valve, no aortic stenosis, trivial regurgitation.  PFO with bidirectional shunt.  RECOMMENDATIONS:     proceed to [facility]  Procedure: Electrical Cardioversion Indications:  Atrial Fibrillation  Procedure Details:  Prior to sedation - Consent: Risks of procedure as well as the alternatives and risks of each were explained to the (patient/caregiver).  Consent for procedure obtained.  Time Out: Verified patient identification, verified procedure, site/side was marked, verified correct patient position, special  equipment/implants available, medications/allergies/relevent history reviewed, required imaging and test results available. PERFORMED.  Patient placed on cardiac monitor, pulse oximetry, supplemental oxygen as necessary.  Sedation given: as above Pacer pads placed anterior and posterior chest.  Cardioverted 2 time(s).  Cardioversion with synchronized biphasic 120J, 200J shock.  Evaluation: Findings: Post procedure EKG shows: NSR Complications: None Patient did tolerate procedure well.  Time Spent Directly with the Patient:  60 minutes   Elouise Munroe 12/30/2019, 8:46 AM

## 2019-12-30 NOTE — Consult Note (Addendum)
Cardiology Consultation:   Patient ID: Cindy Christian MRN: [id number]; DOB: [birth year]  Admit date: 12/26/2019 Date of Consult: 12/30/2019  Primary Care Provider: Carollee Herter, Alferd Apa, DO Primary Cardiologist: new to [facility] Primary Electrophysiologist:  None    Patient Profile:   Cindy Christian is a 49 y.o. female with no significant PMHx who is being seen today for the evaluation of Afib at the request of Dr. Marisue Ivan.  History of Present Illness:   Cindy Christian was admitted to [facility] 12/26/2019 with about a 5 week h/o unusual SOB, increasing abdominal bloating, palpitations She was found in rapid Afib started on heparin and dilt gtts  Cardiology elicited symptoms perhaps of untreated sleep apnea, and significant ETOH of 6 beers in a day about 3-4x week (stopped 2 weeks or more prior to her coming in) Rates generally maintained 90-110 ranger on dilt gtt She was started on IV diuretics, transitioned to Eliquis and planned for TEE/DCCV This morning: FINDINGS:  No LA or LAA thrombus. No LV apical thrombus.  Mild-moderate mitral valve regurgitation Mild tricuspid valve regurgitation.  Trileaflet aortic valve, no aortic stenosis, trivial regurgitation.  PFO with bidirectional shunt. DCCV Cardioverted 2 time(s).  Cardioversion with synchronized biphasic 120J, 200J shock. (in d/w Dr. Marisue Ivan, had a few beats of SR reported then return of AF)   LABS K+ 3.6 >> 3.7 BUN/Creat 15/0.90  >> <5/0.67 AST 63 ALT 58 WBC 8.3 H/H 11/35 Plts 251  COVID neg DDimer 1.19 (H)  >> CT neg for embolus HS Trop 7 > 7 TSH 5.209, Free T3 3.6, T4 1.11 Preg negative   Current rate control meds Dilt gtt transitioning to PO 90mg  Q6 today   The patient reports generally a pretty healthy life style, moderately overweight, though until COVID hit, walking regularly for exercise, minimal ETOH.  (quit smoking years ago and never any drugs).   In the last year unfortunately became much more sedentary, coming home from  work enjoying a few beers on the deck 4-5 nights most weeks, some weeks less, and no exercise.  She reports a lifetime of palpitations, generally short lived, like a hummingbird in her chest.  As she has gotten older, attributed to stress, perhaps pre-menopause.  Nover any CP, no dizzy ness, near syncope or syncope with them.  6 weeks ago she and her husband committed to getting back on track.  She reports no ETOH in 6 weeks, they have been walking about 2 miles most days, and doing some mild weight exercises as well.  No exertional intolerances and feleing very well until a couple weeks ago. She started noting she was getting winded easier then usual with less exertion.  Once when walking had to stop to get her breathing settled, this she has never had to do before. She noted feeling bloated, abdominal fullness, and eventually symptoms of orthopnea as well.  In these past several week, she has not had any notice of palpitations in her chest, though occasionally feeling her pulse it was fast, but without particular symptoms she felt were cardia, did not think too much of it. She denies any kind of CP.  She was planned to see a pulmonologist for her SOB soon.   Past Medical History:  Diagnosis Date  . Borderline glaucoma    popa    Past Surgical History:  Procedure Laterality Date  . FINGER FRACTURE SURGERY Left 1983   middle finger  . KNEE ARTHROPLASTY Bilateral 2000 R 2008 L  . LEEP  2004  CIN 1-2     Home Medications:  Prior to Admission medications   Medication Sig Start Date End Date Taking? Authorizing Provider  OVER THE COUNTER MEDICATION Place 1 drop into both eyes daily. Simalasin Allergy Eye Relief - homeopathic   Yes [provider]  Ascorbic Acid (VITAMIN C) 500 MG CAPS Take 100 mg by mouth daily.     [provider]  b complex vitamins tablet Take 1 tablet by mouth daily.    [provider]  Calcium Carb-Cholecalciferol (CALCIUM + D3 PO) Take  1 tablet by mouth daily.     [provider]  Zinc 50 MG CAPS Take 50 mg by mouth daily.    [provider]    Inpatient Medications: Scheduled Meds: . apixaban  5 mg Oral BID  . diltiazem  90 mg Oral Q6H  . sodium chloride flush  3 mL Intravenous Q12H   Continuous Infusions: . sodium chloride    . diltiazem (CARDIZEM) infusion 15 mg/hr (12/29/19 1648)   PRN Meds: sodium chloride, acetaminophen, LORazepam, ondansetron (ZOFRAN) IV, sodium chloride flush  Allergies:    Allergies  Allergen Reactions  . Penicillins Other (See Comments)    Childhood allergy Did it involve swelling of the face/tongue/throat, SOB, or low BP? Unknown Did it involve sudden or severe rash/hives, skin peeling, or any reaction on the inside of your mouth or nose? Unknown Did you need to seek medical attention at a hospital or doctor's office? Unknown When did it last happen?young child If all above answers are "NO", may proceed with cephalosporin use.    Social History:   Social History   Socioeconomic History  . Marital status: Single    Spouse name: Not on file  . Number of children: Not on file  . Years of education: Not on file  . Highest education level: Not on file  Occupational History  . Occupation: admin    Comment: Psychologist, counselling  Tobacco Use  . Smoking status: Former Smoker    Packs/day: 0.50    Years: 13.00    Pack years: 6.50    Quit date: 04/24/1998    Years since quitting: 21.6  . Smokeless tobacco: Never Used  Substance and Sexual Activity  . Alcohol use: Yes    Alcohol/week: 16.0 standard drinks    Types: 4 Glasses of wine, 12 Cans of beer per week  . Drug use: No  . Sexual activity: Yes    Partners: Male  Other Topics Concern  . Not on file  Social History Narrative   Walking daily   Social Determinants of Health   Financial Resource Strain:   . Difficulty of Paying Living Expenses:   Food Insecurity:   . Worried About Sales executive in the Last Year:   . Arboriculturist in the Last Year:   Transportation Needs:   . Film/video editor (Medical):   Marland Kitchen Lack of Transportation (Non-Medical):   Physical Activity:   . Days of Exercise per Week:   . Minutes of Exercise per Session:   Stress:   . Feeling of Stress :   Social Connections:   . Frequency of Communication with Friends and Family:   . Frequency of Social Gatherings with Friends and Family:   . Attends Religious Services:   . Active Member of Clubs or Organizations:   . Attends Archivist Meetings:   Marland Kitchen Marital Status:   Intimate Partner Violence:   . Fear  of Current or Ex-Partner:   . Emotionally Abused:   Marland Kitchen Physically Abused:   . Sexually Abused:     Family History:   Family History  Problem Relation Age of Onset  . Diabetes Maternal Uncle        Type 1  . Stroke Maternal Grandmother      ROS:  Please see the history of present illness.  All other ROS reviewed and negative.     Physical Exam/Data:   Vitals:   12/30/19 0847 12/30/19 0850 12/30/19 0905 12/30/19 1000  BP: 98/70 114/76 108/73   Pulse: 91 93 (!) 110   Resp: 19 (!) 25 17 16   Temp: 97.6 F (36.4 C)     TempSrc: Oral     SpO2: 92% 97% 100%   Weight:      Height:        Intake/Output Summary (Last 24 hours) at 12/30/2019 1012 Last data filed at 12/30/2019 0840 Gross per 24 hour  Intake 760 ml  Output --  Net 760 ml   Last 3 Weights 12/30/2019 12/30/2019 12/29/2019  Weight (lbs) 194 lb 0.1 oz 194 lb 0.1 oz 196 lb 13.9 oz  Weight (kg) 88 kg 88 kg 89.3 kg     Body mass index is 33.3 kg/m.  General:  Well nourished, well developed, in no acute distress HEENT: normal Lymph: no adenopathy Neck: no JVD Endocrine:  No thryomegaly Vascular: No carotid bruits Cardiac:  irreg-irreg, tachycardic; no murmurs, gallops or rubs Lungs:  CTA b/l, no wheezing, rhonchi or rales  Abd: soft, nontender, obese Ext: no edema Musculoskeletal:  No deformities Skin: warm and  dry  Neuro:  no focal abnormalities noted Psych:  Normal affect   EKG:  The EKG was personally reviewed and demonstrates:   Afib, V rate 189  Telemetry:  Telemetry was personally reviewed and demonstrates:   Afib 110's currently  Relevant CV Studies:  Echo 12/28/19 1. Technically difficult study due to atrial fibrillation and poor  windows. Grossly normal LV systolic function. Left ventricular ejection  fraction, by estimation, is 55 to 60%. The left ventricle has normal  function. The left ventricle has no regional  wall motion abnormalities. There is mild left ventricular hypertrophy.  Left ventricular diastolic parameters are indeterminate.  2. Right ventricular systolic function is normal. The right ventricular  size is mildly enlarged.  3. Right atrial size was mildly dilated.  4. The mitral valve is normal in structure. Mild mitral valve  regurgitation.  5. The aortic valve was not well visualized. Aortic valve regurgitation  is not visualized. AV gradients were not measured    LA 4.00cm, described as normal in size  Laboratory Data:  High Sensitivity Troponin:   Recent Labs  Lab 12/26/19 1957 12/26/19 2240  TROPONINIHS 7 7     Chemistry Recent Labs  Lab 12/28/19 1559 12/29/19 0217 12/30/19 0308  NA 136 140 137  K 3.7 4.2 3.7  CL 106 110 107  CO2 22 23 22   GLUCOSE 125* 106* 88  BUN <5* <5* <5*  CREATININE 0.65 0.62 0.67  CALCIUM 8.6* 8.6* 8.7*  GFRNONAA >60 >60 >60  GFRAA >60 >60 >60  ANIONGAP 8 7 8     Recent Labs  Lab 12/26/19 1957 12/28/19 1559 12/29/19 0217  PROT 7.2  --   --   ALBUMIN 3.9 3.6 3.3*  AST 63*  --   --   ALT 58*  --   --   ALKPHOS 52  --   --  BILITOT 0.9  --   --    Hematology Recent Labs  Lab 12/28/19 0246 12/29/19 0217 12/30/19 0308  WBC 8.2 6.9 5.2  RBC 3.67* 3.74* 3.89  HGB 11.5* 11.6* 12.0  HCT 35.1* 36.5 37.9  MCV 95.6 97.6 97.4  MCH 31.3 31.0 30.8  MCHC 32.8 31.8 31.7  RDW 12.9 13.2 13.1  PLT 251 255  275   BNP Recent Labs  Lab 12/26/19 1957  BNP 179.7*    DDimer  Recent Labs  Lab 12/26/19 1957  DDIMER 1.19*     Radiology/Studies:   CT Angio Chest PE W/Cm &/Or Wo Cm Result Date: 12/27/2019 CLINICAL DATA:  PE suspected, low/intermediate prob, positive D-dimer Shortness of breath for 5 weeks, worse with activity and lying flat. EXAM: CT ANGIOGRAPHY CHEST WITH CONTRAST TECHNIQUE: Multidetector CT imaging of the chest was performed using the standard protocol during bolus administration of intravenous contrast. Multiplanar CT image reconstructions and MIPs were obtained to evaluate the vascular anatomy. CONTRAST:  29mL OMNIPAQUE IOHEXOL 350 MG/ML SOLN COMPARISON:  Lung bases from abdominal CT earlier today. Chest radiograph earlier today. FINDINGS: Cardiovascular: There are no filling defects within the pulmonary arteries to suggest pulmonary embolus. Upper normal heart size. Thoracic aorta is normal in caliber. Mild aortic atherosclerosis. No evidence of aortic dissection. No pericardial effusion. Mediastinum/Nodes: Small mediastinal and hilar lymph nodes are not enlarged by size criteria. No esophageal wall thickening. No visualized pulmonary nodule. Lungs/Pleura: Small to moderate bilateral pleural effusions. Adjacent atelectasis in both lower lobes. Basilar septal thickening. Peribronchial thickening which may be congestive. Scattered nodular ground-glass opacities in the mid lower lung zones, series 5 images 53, 55, and image 70. Upper Abdomen: Assessed on abdominal CT earlier today. No interval change. Musculoskeletal: There are no acute or suspicious osseous abnormalities. Review of the MIP images confirms the above findings. IMPRESSION: 1. No pulmonary embolus. 2. Small to moderate bilateral pleural effusions, adjacent atelectasis. Basilar septal thickening suspicious for pulmonary edema. Bronchial thickening, suspect this is congestive. 3. Nodular ground-glass opacities in the bilateral  mid lower lung zones, nonspecific for pulmonary edema versus infectious or inflammatory etiology. COVID pneumonia can produce a similar appearance, though this is slightly atypical. Aortic Atherosclerosis (ICD10-I70.0). Electronically Signed   By: Keith Rake M.D.   On: 12/27/2019 00:11    DG Chest Port 1 View Result Date: 12/26/2019 CLINICAL DATA:  Shortness of breath EXAM: PORTABLE CHEST 1 VIEW COMPARISON:  May 07, 2007 FINDINGS: The heart size and mediastinal contours are within normal limits. There is hazy airspace opacity seen at the right lung base. No pleural effusion is seen. No acute osseous abnormality. IMPRESSION: Hazy airspace opacity at the right lung base which could be due to atelectasis or infectious etiology. Electronically Signed   By: Prudencio Pair M.D.   On: 12/26/2019 20:42     US Abdomen Limited RUQ Result Date: 12/28/2019 CLINICAL DATA:  Elevated liver enzymes EXAM: ULTRASOUND ABDOMEN LIMITED RIGHT UPPER QUADRANT COMPARISON:  None. FINDINGS: Gallbladder: No gallstones or wall thickening visualized. There is no pericholecystic fluid. No sonographic Murphy sign noted by sonographer. Common bile duct: Diameter: 3 mm. No intrahepatic or extrahepatic biliary duct dilatation. Liver: No focal lesion identified. Within normal limits in parenchymal echogenicity. Portal vein is patent on color Doppler imaging with normal direction of blood flow towards the liver. Other: There is a right pleural effusion. IMPRESSION: Right pleural effusion.  Study otherwise unremarkable. Electronically Signed   By: Lowella Grip III M.D.   On:  12/28/2019 07:59   {   Assessment and Plan:   1. New onset AFlutter/AFib      Very fast     CHA2DS2Vasc is one for female  Started on eliquis here >> DCCV this AM with a few beats of SR and ERAF to fast AFib   Lifestyle adjustments  Weight loss, exercise Continue to abstain from ETOH Sleep study and treat of OSA  Could try Flecainide for AAD  drug, neg HS Trop with HR 180's, doubt any sign CAD, young age. though would also consider early ablation   She would like to proceed with AFib ablation, she would like to avoid daily medicines if she can and hopes if the ablation successful, this may be possible.  Dr. Curt Bears Clary Meeker see her later today, discuss options/recommendations further.      For questions or updates, please contact [place] Please consult [email or web address] for contact info under     Signed, Baldwin Jamaica, PA-C  12/30/2019 10:12 AM   I have seen and examined this patient with [name].  Agree with above, note added to reflect my findings.  On exam, irregular, no murmurs, lungs clear.  Patient presented to the hospital with a heart failure exacerbation, was found to be in atrial fibrillation with rapid ventricular response.  Her ventricular rate was in the 180s.  She does not have a prior history of atrial fibrillation.  Fortunately she had an echo performed which showed a normal ejection fraction.  It is likely that her heart failure was due to her tachycardia.  She had a TEE and cardioversion today, but unfortunately had ERAF.  We Ottilia Pippenger plan to start her on flecainide.  We Jewelene Mairena give her a dose of 300 mg initially to see if this Samarie Pinder convert her to sinus rhythm.  We Zaydee Aina continue her to 100 mg twice daily.  If she does not convert with this, Brexton Sofia plan for repeat cardioversion tomorrow.  If she does convert to sinus rhythm, she Mandrell Vangilder likely be able to be discharged.  I did discuss with her the option of ablation in the future which she is amenable to.  We Alesha Jaffee see her back in clinic for further discussions of ablation.  Shaine Mount M. Maxemiliano Riel MD 12/30/2019 3:16 PM

## 2019-12-30 NOTE — Transfer of Care (Signed)
Immediate Anesthesia Transfer of Care Note  Patient: Cindy Christian  Procedure(s) Performed: TRANSESOPHAGEAL ECHOCARDIOGRAM (TEE) (N/A ) CARDIOVERSION (N/A ) BUBBLE STUDY  Patient Location: PACU and Endoscopy Unit  Anesthesia Type:General  Level of Consciousness: awake, alert  and oriented  Airway & Oxygen Therapy: Patient Spontanous Breathing and Patient connected to nasal cannula oxygen  Post-op Assessment: Report given to RN and Post -op Vital signs reviewed and stable  Post vital signs: Reviewed and stable  Last Vitals:  Vitals Value Taken Time  BP 98/70 12/30/19 0847  Temp    Pulse 91 12/30/19 0853  Resp 17 12/30/19 0853  SpO2 100 % 12/30/19 0853  Vitals shown include unvalidated device data.  Last Pain:  Vitals:   12/30/19 0847  TempSrc:   PainSc: Asleep         Complications: No apparent anesthesia complications

## 2019-12-30 NOTE — Plan of Care (Signed)
  Problem: Education: Goal: Individualized Educational Video(s) Outcome: Progressing   Problem: Cardiac: Goal: Ability to achieve and maintain adequate cardiopulmonary perfusion will improve Outcome: Progressing   Problem: Health Behavior/Discharge Planning: Goal: Ability to safely manage health-related needs after discharge will improve Outcome: Progressing   Problem: Education: Goal: Knowledge of General Education information will improve Description: Including pain rating scale, medication(s)/side effects and non-pharmacologic comfort measures Outcome: Progressing   Problem: Health Behavior/Discharge Planning: Goal: Ability to manage health-related needs will improve Outcome: Progressing   Problem: Clinical Measurements: Goal: Ability to maintain clinical measurements within normal limits will improve Outcome: Progressing Goal: Will remain free from infection Outcome: Progressing Goal: Diagnostic test results will improve Outcome: Progressing Goal: Respiratory complications will improve Outcome: Progressing Goal: Cardiovascular complication will be avoided Outcome: Progressing   Problem: Activity: Goal: Risk for activity intolerance will decrease Outcome: Progressing   Problem: Nutrition: Goal: Adequate nutrition will be maintained Outcome: Progressing   Problem: Coping: Goal: Level of anxiety will decrease Outcome: Progressing   Problem: Elimination: Goal: Will not experience complications related to bowel motility Outcome: Progressing Goal: Will not experience complications related to urinary retention Outcome: Progressing   Problem: Pain Managment: Goal: General experience of comfort will improve Outcome: Progressing   Problem: Safety: Goal: Ability to remain free from injury will improve Outcome: Progressing   Problem: Skin Integrity: Goal: Risk for impaired skin integrity will decrease Outcome: Progressing

## 2019-12-30 NOTE — Anesthesia Procedure Notes (Signed)
Procedure Name: MAC Date/Time: 12/30/2019 8:03 AM Performed by: Trinna Post., CRNA Pre-anesthesia Checklist: Patient identified, Emergency Drugs available, Suction available, Patient being monitored and Timeout performed Patient Re-evaluated:Patient Re-evaluated prior to induction Oxygen Delivery Method: Circle system utilized Preoxygenation: Pre-oxygenation with 100% oxygen Induction Type: IV induction Placement Confirmation: positive ETCO2

## 2019-12-30 NOTE — Progress Notes (Signed)
Cardiology Progress Note  Patient ID: Cindy Christian MRN: [id number] DOB: [birth year] Date of Encounter: 12/30/2019  Primary Cardiologist: No primary care provider on file.  Subjective  TEE cardioversion unsuccessful.  She did go briefly into normal sinus rhythm.  Back in A. fib with RVR.  Reports no symptoms.  Denies chest pain, shortness of breath, palpitations.  ROS:  All other ROS reviewed and negative. Pertinent positives noted in the HPI.     Inpatient Medications  Scheduled Meds: . apixaban  5 mg Oral BID  . sodium chloride flush  3 mL Intravenous Q12H   Continuous Infusions: . sodium chloride    . diltiazem (CARDIZEM) infusion 15 mg/hr (12/29/19 1648)   PRN Meds: sodium chloride, acetaminophen, LORazepam, ondansetron (ZOFRAN) IV, sodium chloride flush   Vital Signs   Vitals:   12/30/19 0847 12/30/19 0850 12/30/19 0905 12/30/19 1000  BP: 98/70 114/76 108/73   Pulse: 91 93 (!) 110   Resp: 19 (!) 25 17 16   Temp: 97.6 F (36.4 C)     TempSrc: Oral     SpO2: 92% 97% 100%   Weight:      Height:        Intake/Output Summary (Last 24 hours) at 12/30/2019 1005 Last data filed at 12/30/2019 0840 Gross per 24 hour  Intake 760 ml  Output --  Net 760 ml   Last 3 Weights 12/30/2019 12/30/2019 12/29/2019  Weight (lbs) 194 lb 0.1 oz 194 lb 0.1 oz 196 lb 13.9 oz  Weight (kg) 88 kg 88 kg 89.3 kg      Telemetry  Overnight telemetry shows atrial fibrillation with heart rate in the 90-110 range, which I personally reviewed.   ECG  The most recent ECG shows A. fib with RVR, heart rate 189, which I personally reviewed.   Physical Exam   Vitals:   12/30/19 0847 12/30/19 0850 12/30/19 0905 12/30/19 1000  BP: 98/70 114/76 108/73   Pulse: 91 93 (!) 110   Resp: 19 (!) 25 17 16   Temp: 97.6 F (36.4 C)     TempSrc: Oral     SpO2: 92% 97% 100%   Weight:      Height:         Intake/Output Summary (Last 24 hours) at 12/30/2019 1005 Last data filed at 12/30/2019 0840 Gross per  24 hour  Intake 760 ml  Output --  Net 760 ml    Last 3 Weights 12/30/2019 12/30/2019 12/29/2019  Weight (lbs) 194 lb 0.1 oz 194 lb 0.1 oz 196 lb 13.9 oz  Weight (kg) 88 kg 88 kg 89.3 kg    Body mass index is 33.3 kg/m.   General: Well nourished, well developed, in no acute distress Head: Atraumatic, normal size  Eyes: PEERLA, EOMI  Neck: Supple, no JVD Endocrine: No thryomegaly Cardiac: Irregular rhythm, no murmurs rubs or gallops Lungs: Clear to auscultation bilaterally, no wheezing, rhonchi or rales  Abd: Soft, nontender, no hepatomegaly  Ext: Trace edema Musculoskeletal: No deformities, BUE and BLE strength normal and equal Skin: Warm and dry, no rashes   Neuro: Alert and oriented to person, place, time, and situation, CNII-XII grossly intact, no focal deficits  Psych: Normal mood and affect   Labs  High Sensitivity Troponin:   Recent Labs  Lab 12/26/19 1957 12/26/19 2240  TROPONINIHS 7 7     Cardiac EnzymesNo results for input(s): TROPONINI in the last 168 hours. No results for input(s): TROPIPOC in the last 168 hours.  Chemistry Recent Labs  Lab 12/26/19 1957 12/28/19 0246 12/28/19 1559 12/29/19 0217 12/30/19 0308  NA 137   < > 136 140 137  K 3.6   < > 3.7 4.2 3.7  CL 106   < > 106 110 107  CO2 20*   < > 22 23 22   GLUCOSE 132*   < > 125* 106* 88  BUN 15   < > <5* <5* <5*  CREATININE 0.90   < > 0.65 0.62 0.67  CALCIUM 9.3   < > 8.6* 8.6* 8.7*  PROT 7.2  --   --   --   --   ALBUMIN 3.9  --  3.6 3.3*  --   AST 63*  --   --   --   --   ALT 58*  --   --   --   --   ALKPHOS 52  --   --   --   --   BILITOT 0.9  --   --   --   --   GFRNONAA >60   < > >60 >60 >60  GFRAA >60   < > >60 >60 >60  ANIONGAP 11   < > 8 7 8    < > = values in this interval not displayed.    Hematology Recent Labs  Lab 12/28/19 0246 12/29/19 0217 12/30/19 0308  WBC 8.2 6.9 5.2  RBC 3.67* 3.74* 3.89  HGB 11.5* 11.6* 12.0  HCT 35.1* 36.5 37.9  MCV 95.6 97.6 97.4  MCH 31.3 31.0  30.8  MCHC 32.8 31.8 31.7  RDW 12.9 13.2 13.1  PLT 251 255 275   BNP Recent Labs  Lab 12/26/19 1957  BNP 179.7*    DDimer  Recent Labs  Lab 12/26/19 1957  DDIMER 1.19*     Radiology  No results found.  Cardiac Studies  TTE 12/28/2019 1. Technically difficult study due to atrial fibrillation and poor  windows. Grossly normal LV systolic function. Left ventricular ejection  fraction, by estimation, is 55 to 60%. The left ventricle has normal  function. The left ventricle has no regional  wall motion abnormalities. There is mild left ventricular hypertrophy.  Left ventricular diastolic parameters are indeterminate.  2. Right ventricular systolic function is normal. The right ventricular  size is mildly enlarged.  3. Right atrial size was mildly dilated.  4. The mitral valve is normal in structure. Mild mitral valve  regurgitation.  5. The aortic valve was not well visualized. Aortic valve regurgitation  is not visualized. AV gradients were not measured   Patient Profile  Cindy Christian is a 49 year old female without significant medical history who was admitted with new onset atrial fibrillation with RVR as well as acute decompensated diastolic heart failure.  Assessment & Plan   1. New onset atrial fibrillation with RVR:  -Suspect this is been going on for at least 1 month.  Developed a bit of volume overload from this. -A1c 5.5.  TSH 5.2, free T4 normal -Echo with EF 50-55% normal left atrial size. -Likely has some alcohol abuse she was dealing with.  Has stopped drinking for 1 month.  No signs of withdrawal.  She will refrain from alcohol moving forward. -TEE/cardioversion was unsuccessful today.  She did briefly go back in normal sinus rhythm.  Now back in A. fib with RVR. -I will go ahead and give her oral diltiazem 90 mg every 6 hours.  We will stop the dill drip 2 hours after her dose of diltiazem.  We  will try to get her under rate control. -Continue Eliquis for  anticoagulation. -I have reached out to electrophysiology to evaluate her.  She is having rather difficult to control A. fib and with an unsuccessful cardioversion she may need antiarrhythmic therapy.  Given her young age do not use amiodarone.  Flecainide and propafenone may be of little benefit here.  She may need Tikosyn.  We will have them weigh in.  2.  Acute diastolic heart failure -I have suspect this was related to rapid A. fib.  Her rates were in the 190s on admission.  She has been given Lasix with good urine output. -Euvolemic on examination  For questions or updates, please contact [place] Please consult [email or web address] for contact info under   Time Spent with Patient: I have spent a total of 35 minutes with patient reviewing hospital notes, telemetry, EKGs, labs and examining the patient as well as establishing an assessment and plan that was discussed with the patient.  > 50% of time was spent in direct patient care.    Signed, Addison Naegeli. Audie Box, [place]  12/30/2019 10:05 AM

## 2019-12-30 NOTE — Interval H&P Note (Signed)
History and Physical Interval Note:  12/30/2019 7:51 AM  Cindy Christian  has presented today for surgery, with the diagnosis of afib and heart failure.  The various methods of treatment have been discussed with the patient and family. After consideration of risks, benefits and other options for treatment, the patient has consented to  Procedure(s): TRANSESOPHAGEAL ECHOCARDIOGRAM (TEE) (N/A) CARDIOVERSION (N/A) as a surgical intervention.  The patient's history has been reviewed, patient examined, no change in status, stable for surgery.  I have reviewed the patient's chart and labs.  Questions were answered to the patient's satisfaction.     Elouise Munroe

## 2019-12-30 NOTE — Anesthesia Postprocedure Evaluation (Signed)
Anesthesia Post Note  Patient: Cindy Christian  Procedure(s) Performed: TRANSESOPHAGEAL ECHOCARDIOGRAM (TEE) (N/A ) CARDIOVERSION (N/A ) BUBBLE STUDY     Patient location during evaluation: Endoscopy Anesthesia Type: General Level of consciousness: awake Pain management: pain level controlled Vital Signs Assessment: post-procedure vital signs reviewed and stable Respiratory status: spontaneous breathing Cardiovascular status: stable Postop Assessment: no apparent nausea or vomiting Anesthetic complications: no    Last Vitals:  Vitals:   12/30/19 0659 12/30/19 0847  BP: 95/76 98/70  Pulse:  91  Resp: 12 19  Temp: 36.4 C   SpO2: 95% 92%    Last Pain:  Vitals:   12/30/19 0847  TempSrc:   PainSc: Asleep   Pain Goal:                   Huston Foley

## 2019-12-30 NOTE — Plan of Care (Signed)
  Problem: Education: Goal: Individualized Educational Video(s) Outcome: Progressing   Problem: Cardiac: Goal: Ability to achieve and maintain adequate cardiopulmonary perfusion will improve Outcome: Progressing   Problem: Health Behavior/Discharge Planning: Goal: Ability to manage health-related needs will improve Outcome: Progressing   Problem: Clinical Measurements: Goal: Will remain free from infection Outcome: Progressing Goal: Respiratory complications will improve Outcome: Progressing Goal: Cardiovascular complication will be avoided Outcome: Progressing

## 2019-12-30 NOTE — Progress Notes (Signed)
  Echocardiogram Echocardiogram Transesophageal has been performed.  Cindy Christian 12/30/2019, 8:57 AM

## 2019-12-30 NOTE — Progress Notes (Signed)
PROGRESS NOTE    Cindy Christian  [id number] DOB: 1971-03-14 DOA: 12/26/2019 PCP: Ann Held, DO   Brief Narrative:  49 year old Caucasian female with past medical history significant for obesity and significant alcohol use.  Patient was admitted with atrial fibrillation with rapid ventricular response.    Echocardiogram showed normal EF, elevated BNP.  CTA chest negative for PE.  Right upper quadrant is negative for acute cholecystitis.  Currently on Cardizem drip.  Cardiology team following planning for cardioversion.  Cardioversion was unsuccessful EP consulted   Assessment & Plan:   Active Problems:   Atrial fibrillation with RVR (HCC)   A-fib (HCC)  Atrial fibrillation with RVR, rate controlled now. Dyspnea on exertion with elevated BNP with pulmonary edema -Respiration status is improved.  Cardioversion unsuccessful this morning. -Continue Cardizem drip.  EP cardiology consulted by cardiology team.  Benefits of antiarrhythmi versus ablation -Echocardiogram shows EF of 50-55%.  Acute diastolic congestive heart failure with preserved ejection fraction, 55% -Diuresed well with IV Lasix.  Currently appears to be euvolemic  History of alcohol use -Monitor for any signs of withdrawal  Hypokalemia -Improved  Transaminitis -Suspect from congestion alcohol use.  Ultrasound-negative  Obesity Body mass index is 33.3 kg/m.   DVT prophylaxis: Eliquis Code Status: Full code Family Communication: Family at bedside Disposition Plan:   Patient From= home  Patient Anticipated D/C place= Home  Barriers= unsuccessful cardioversion still on Cardizem drip in atrial fibrillation with intermittent RVR.  Unsafe for discharge.     Subjective: Cardioversion unsuccessful this morning.  Patient is upset that her cardioversion was unsuccessful other complaints.  Review of Systems Otherwise negative except as per HPI, including: General = no fevers, chills, dizziness,   fatigue HEENT/EYES = negative for loss of vision, double vision, blurred vision,  sore throa Cardiovascular= negative for chest pain, palpitation Respiratory/lungs= negative for shortness of breath, cough, wheezing; hemoptysis,  Gastrointestinal= negative for nausea, vomiting, abdominal pain Genitourinary= negative for Dysuria MSK = Negative for arthralgia, myalgias Neurology= Negative for headache, numbness, tingling  Psychiatry= Negative for suicidal and homocidal ideation Skin= Negative for Rash  Examination: Constitutional: Not in acute distress Respiratory: Clear to auscultation bilaterally Cardiovascular: Irregularly irregular, no rubs Abdomen: Nontender nondistended good bowel sounds Musculoskeletal: No edema noted Skin: No rashes seen Neurologic: CN 2-12 grossly intact.  And nonfocal Psychiatric: Normal judgment and insight. Alert and oriented x 3. Normal mood.      Objective: Vitals:   12/30/19 0847 12/30/19 0850 12/30/19 0905 12/30/19 1000  BP: 98/70 114/76 108/73   Pulse: 91 93 (!) 110   Resp: 19 (!) 25 17 16   Temp: 97.6 F (36.4 C)     TempSrc: Oral     SpO2: 92% 97% 100%   Weight:      Height:        Intake/Output Summary (Last 24 hours) at 12/30/2019 1102 Last data filed at 12/30/2019 0840 Gross per 24 hour  Intake 760 ml  Output --  Net 760 ml   Filed Weights   12/29/19 0555 12/30/19 0500 12/30/19 0659  Weight: 89.3 kg 88 kg 88 kg     Data Reviewed:   CBC: Recent Labs  Lab 12/28/19 0246 12/29/19 0217 12/30/19 0308  WBC 8.2 6.9 5.2  HGB 11.5* 11.6* 12.0  HCT 35.1* 36.5 37.9  MCV 95.6 97.6 97.4  PLT 251 255 [id number]   Basic Metabolic Panel: Recent Labs  Lab 12/26/19 1957 12/28/19 0246 12/28/19 1559 12/29/19 0217 12/30/19 0308  NA 137  140 136 140 137  K 3.6 2.8* 3.7 4.2 3.7  CL 106 104 106 110 107  CO2 20* 23 22 23 22   GLUCOSE 132* 101* 125* 106* 88  BUN 15 5* <5* <5* <5*  CREATININE 0.90 0.69 0.65 0.62 0.67  CALCIUM 9.3 8.7* 8.6*  8.6* 8.7*  MG  --  1.6*  --  1.9 1.9  PHOS  --  4.4 3.0 3.3  --    GFR: Estimated Creatinine Clearance: 90.3 mL/min (by C-G formula based on SCr of 0.67 mg/dL). Liver Function Tests: Recent Labs  Lab 12/26/19 1957 12/28/19 1559 12/29/19 0217  AST 63*  --   --   ALT 58*  --   --   ALKPHOS 52  --   --   BILITOT 0.9  --   --   PROT 7.2  --   --   ALBUMIN 3.9 3.6 3.3*   Recent Labs  Lab 12/26/19 2248  LIPASE 29   No results for input(s): AMMONIA in the last 168 hours. Coagulation Profile: No results for input(s): INR, PROTIME in the last 168 hours. Cardiac Enzymes: No results for input(s): CKTOTAL, CKMB, CKMBINDEX, TROPONINI in the last 168 hours. BNP (last 3 results) No results for input(s): PROBNP in the last 8760 hours. HbA1C: Recent Labs    12/28/19 0246  HGBA1C 5.5   CBG: Recent Labs  Lab 12/29/19 0009 12/29/19 1144 12/29/19 1607 12/29/19 2358 12/30/19 0632  GLUCAP 100* 123* 115* 103* 88   Lipid Profile: No results for input(s): CHOL, HDL, LDLCALC, TRIG, CHOLHDL, LDLDIRECT in the last 72 hours. Thyroid Function Tests: No results for input(s): TSH, T4TOTAL, FREET4, T3FREE, THYROIDAB in the last 72 hours. Anemia Panel: No results for input(s): VITAMINB12, FOLATE, FERRITIN, TIBC, IRON, RETICCTPCT in the last 72 hours. Sepsis Labs: No results for input(s): PROCALCITON, LATICACIDVEN in the last 168 hours.  Recent Results (from the past 240 hour(s))  SARS CORONAVIRUS 2 (TAT 6-24 HRS) Nasopharyngeal Nasopharyngeal Swab     Status: None   Collection Time: 12/27/19 12:31 AM   Specimen: Nasopharyngeal Swab  Result Value Ref Range Status   SARS Coronavirus 2 NEGATIVE NEGATIVE Final    Comment: (NOTE) SARS-CoV-2 target nucleic acids are NOT DETECTED. The SARS-CoV-2 RNA is generally detectable in upper and lower respiratory specimens during the acute phase of infection. Negative results do not preclude SARS-CoV-2 infection, do not rule out co-infections with  other pathogens, and should not be used as the sole basis for treatment or other patient management decisions. Negative results must be combined with clinical observations, patient history, and epidemiological information. The expected result is Negative. Fact Sheet for Patients: SugarRoll.be Fact Sheet for Healthcare Providers: https://www.woods-mathews.com/ This test is not yet approved or cleared by the [place] FDA and  has been authorized for detection and/or diagnosis of SARS-CoV-2 by FDA under an Emergency Use Authorization (EUA). This EUA will remain  in effect (meaning this test can be used) for the duration of the COVID-19 declaration under Section 56 4(b)(1) of the Act, 21 U.S.C. section 360bbb-3(b)(1), unless the authorization is terminated or revoked sooner. Performed at [facility], [place] [address] [place], [place] [address]   MRSA PCR Screening     Status: None   Collection Time: 12/27/19  7:06 PM   Specimen: Nasopharyngeal  Result Value Ref Range Status   MRSA by PCR NEGATIVE NEGATIVE Final    Comment:        The GeneXpert MRSA Assay (FDA  approved for NASAL specimens only), is one component of a comprehensive MRSA colonization surveillance program. It is not intended to diagnose MRSA infection nor to guide or monitor treatment for MRSA infections. Performed at [facility], [place] [address] [place], [place] [address]          Radiology Studies: No results found.      Scheduled Meds: . apixaban  5 mg Oral BID  . diltiazem  90 mg Oral Q6H  . sodium chloride flush  3 mL Intravenous Q12H   Continuous Infusions: . sodium chloride    . diltiazem (CARDIZEM) infusion 15 mg/hr (12/29/19 1648)     LOS: 4 days   Time spent= 35 mins    Monaca Wadas Arsenio Loader, MD [facility]  If 7PM-7AM, please contact night-coverage  12/30/2019, 11:02 AM

## 2019-12-31 ENCOUNTER — Encounter (HOSPITAL_COMMUNITY): Payer: Self-pay | Admitting: Certified Registered Nurse Anesthetist

## 2019-12-31 ENCOUNTER — Encounter (HOSPITAL_COMMUNITY)
Admission: EM | Disposition: A | Discharge status: Home / Self Care | Payer: Self-pay | Source: Home / Self Care | Attending: Internal Medicine

## 2019-12-31 ENCOUNTER — Telehealth: Payer: Self-pay | Admitting: Cardiovascular Disease

## 2019-12-31 ENCOUNTER — Other Ambulatory Visit: Payer: Self-pay | Admitting: Medical

## 2019-12-31 DIAGNOSIS — I4891 Unspecified atrial fibrillation: Secondary | ICD-10-CM

## 2019-12-31 LAB — CBC
HCT: 37.5 % (ref 36.0–46.0)
Hemoglobin: 11.9 g/dL — ABNORMAL LOW (ref 12.0–15.0)
MCH: 31.3 pg (ref 26.0–34.0)
MCHC: 31.7 g/dL (ref 30.0–36.0)
MCV: 98.7 fL (ref 80.0–100.0)
Platelets: 268 10*3/uL (ref 150–400)
RBC: 3.8 MIL/uL — ABNORMAL LOW (ref 3.87–5.11)
RDW: 13 % (ref 11.5–15.5)
WBC: 5.4 10*3/uL (ref 4.0–10.5)
nRBC: 0 % (ref 0.0–0.2)

## 2019-12-31 LAB — BASIC METABOLIC PANEL
Anion gap: 9 (ref 5–15)
BUN: 6 mg/dL (ref 6–20)
CO2: 21 mmol/L — ABNORMAL LOW (ref 22–32)
Calcium: 8.8 mg/dL — ABNORMAL LOW (ref 8.9–10.3)
Chloride: 110 mmol/L (ref 98–111)
Creatinine, Ser: 0.6 mg/dL (ref 0.44–1.00)
GFR calc Af Amer: >60 mL/min (ref >60)
GFR calc non Af Amer: >60 mL/min (ref >60)
Glucose, Bld: 98 mg/dL (ref 70–99)
Potassium: 4 mmol/L (ref 3.5–5.1)
Sodium: 140 mmol/L (ref 135–145)

## 2019-12-31 LAB — PROTIME-INR
INR: 1.3 — ABNORMAL HIGH (ref 0.8–1.2)
Prothrombin Time: 16.4 seconds — ABNORMAL HIGH (ref 11.4–15.2)

## 2019-12-31 LAB — MAGNESIUM: Magnesium: 1.8 mg/dL (ref 1.7–2.4)

## 2019-12-31 SURGERY — CANCELLED PROCEDURE

## 2019-12-31 MED ORDER — FLECAINIDE ACETATE 100 MG PO TABS: 100.0000 mg | ORAL_TABLET | Freq: Two times a day (BID) | ORAL | 0 refills | Status: DC

## 2019-12-31 MED ORDER — APIXABAN 5 MG PO TABS: 5.0000 mg | ORAL_TABLET | Freq: Two times a day (BID) | ORAL | 0 refills | Status: DC

## 2019-12-31 MED ORDER — METOPROLOL SUCCINATE ER 25 MG PO TB24
25.0000 mg | ORAL_TABLET | Freq: Every day | ORAL | Status: DC
  Administered 2019-12-31: 25 mg via ORAL
  Filled 2019-12-31: qty 1

## 2019-12-31 MED ORDER — METOPROLOL SUCCINATE ER 25 MG PO TB24: 25.0000 mg | ORAL_TABLET | Freq: Every day | ORAL | 0 refills | Status: DC

## 2019-12-31 MED FILL — FLECAINIDE ACETATE 100 MG T: 100 | 30 days supply | Qty: 60 | Fill #0

## 2019-12-31 MED FILL — ELIQUIS 5 MG TABLET: 5 | 30 days supply | Qty: 60 | Fill #0

## 2019-12-31 MED FILL — METOPROLOL SUCCINATE ER 25: 25 | 30 days supply | Qty: 30 | Fill #0

## 2019-12-31 NOTE — Progress Notes (Signed)
Discussed and explained discharge instructions to patient and family, going home with family with belongings. Awaiting medication from [facility].

## 2019-12-31 NOTE — Telephone Encounter (Signed)
Left message for patient to call and schedule ETT ordered by Roby Lofts, PA

## 2019-12-31 NOTE — Progress Notes (Addendum)
Progress Note  Patient Name: Cindy Christian Date of Encounter: 12/31/2019  Primary Cardiologist: new to [facility], Dr. Marisue Ivan  Subjective   Feels great  Inpatient Medications    Scheduled Meds:  apixaban  5 mg Oral BID   docusate sodium  200 mg Oral QHS   flecainide  100 mg Oral Q12H   metoprolol succinate  25 mg Oral Daily   sodium chloride flush  3 mL Intravenous Q12H   Continuous Infusions:  sodium chloride Stopped (12/30/19 0932)   PRN Meds: sodium chloride, acetaminophen, LORazepam, ondansetron (ZOFRAN) IV, sodium chloride flush   Vital Signs    Vitals:   12/31/19 0000 12/31/19 0400 12/31/19 0600 12/31/19 0811  BP:  103/78  97/72  Pulse:    90  Resp: (!) 23 20  13   Temp:  97.9 F (36.6 C)  97.9 F (36.6 C)  TempSrc:  Oral  Oral  SpO2:    98%  Weight:   88.4 kg   Height:        Intake/Output Summary (Last 24 hours) at 12/31/2019 0844 Last data filed at 12/31/2019 0811 Gross per 24 hour  Intake 685 ml  Output --  Net 685 ml   Last 3 Weights 12/31/2019 12/30/2019 12/30/2019  Weight (lbs) 194 lb 14.2 oz 194 lb 0.1 oz 194 lb 0.1 oz  Weight (kg) 88.4 kg 88 kg 88 kg      Telemetry    SR 80's-90's - Personally Reviewed  ECG    SR 84bpm, PR 121ms, QRS 114ms, manually measured QT 424ms, QTc 477ms - Personally Reviewed  Physical Exam   GEN: No acute distress.   Neck: No JVD Cardiac: RRR, no murmurs, rubs, or gallops.  Respiratory: CTA b/l. GI: Soft, nontender, non-distended  MS: No edema; No deformity. Neuro:  Nonfocal  Psych: Normal affect   Labs    High Sensitivity Troponin:   Recent Labs  Lab 12/26/19 1957 12/26/19 2240  TROPONINIHS 7 7      Chemistry Recent Labs  Lab 12/26/19 1957 12/28/19 0246 12/28/19 1559 12/28/19 1559 12/29/19 0217 12/30/19 0308 12/31/19 0228  NA 137   < > 136   < > 140 137 140  K 3.6   < > 3.7   < > 4.2 3.7 4.0  CL 106   < > 106   < > 110 107 110  CO2 20*   < > 22   < > 23 22 21*  GLUCOSE 132*   < > 125*   < > 106*  88 98  BUN 15   < > <5*   < > <5* <5* 6  CREATININE 0.90   < > 0.65   < > 0.62 0.67 0.60  CALCIUM 9.3   < > 8.6*   < > 8.6* 8.7* 8.8*  PROT 7.2  --   --   --   --   --   --   ALBUMIN 3.9  --  3.6  --  3.3*  --   --   AST 63*  --   --   --   --   --   --   ALT 58*  --   --   --   --   --   --   ALKPHOS 52  --   --   --   --   --   --   BILITOT 0.9  --   --   --   --   --   --  GFRNONAA >60   < > >60   < > >60 >60 >60  GFRAA >60   < > >60   < > >60 >60 >60  ANIONGAP 11   < > 8   < > 7 8 9    < > = values in this interval not displayed.     Hematology Recent Labs  Lab 12/29/19 0217 12/30/19 0308 12/31/19 0228  WBC 6.9 5.2 5.4  RBC 3.74* 3.89 3.80*  HGB 11.6* 12.0 11.9*  HCT 36.5 37.9 37.5  MCV 97.6 97.4 98.7  MCH 31.0 30.8 31.3  MCHC 31.8 31.7 31.7  RDW 13.2 13.1 13.0  PLT 255 275 268    BNP Recent Labs  Lab 12/26/19 1957  BNP 179.7*     DDimer  Recent Labs  Lab 12/26/19 1957  DDIMER 1.19*     Radiology      Cardiac Studies   12/30/2019; TEE FINDINGS:  No LA or LAA thrombus. No LV apical thrombus.  Mild-moderate mitral valve regurgitation Mild tricuspid valve regurgitation.  Trileaflet aortic valve, no aortic stenosis, trivial regurgitation.  PFO with bidirectional shunt. DCCV Cardioverted 2 time(s).  Cardioversion with synchronized biphasic 120J, 200J shock.  Echo 12/28/19 1. Technically difficult study due to atrial fibrillation and poor  windows. Grossly normal LV systolic function. Left ventricular ejection  fraction, by estimation, is 55 to 60%. The left ventricle has normal  function. The left ventricle has no regional  wall motion abnormalities. There is mild left ventricular hypertrophy.  Left ventricular diastolic parameters are indeterminate.   2. Right ventricular systolic function is normal. The right ventricular  size is mildly enlarged.   3. Right atrial size was mildly dilated.   4. The mitral valve is normal in structure. Mild  mitral valve  regurgitation.   5. The aortic valve was not well visualized. Aortic valve regurgitation  is not visualized. AV gradients were not measured   Patient Profile     49 y.o. female with no notable PMHx admitted with Afib w/RVR and volume OL  Assessment & Plan    1. New onset AFib     Failed initial DCCV with only moments of SR     Flecainide started yesterday     Cardioverted to SR early this AM     EKG is reviewed with Dr. Curt Bears  D/w Dr. Marisue Ivan he Cindy Christian follow next week for EKG and plan EST and sleep study, planned to discharge on flecainide and metoprolol Follow up with Dr. Curt Bears late may in place to revisit PVI ablation.     For questions or updates, please contact [place] Please consult [email or web address] for contact info under        Signed, Baldwin [place], PA-C  12/31/2019, 8:44 AM    I have seen and examined this patient with [name].  Agree with above, note added to reflect my findings.  On exam, RRR, no murmurs, lungs clear.  Patient was given flecainide yesterday.  Fortunately she is converted to sinus rhythm.  We [name] continue with flecainide at the current dose.  Also would continue anticoagulation with Eliquis and her rate control medications.  At this point EP to sign off.  We [name] arrange for follow-up in EP clinic.  [name] M. [name] MD 12/31/2019 8:55 AM

## 2019-12-31 NOTE — Discharge Summary (Signed)
Physician Discharge Summary  Cindy Christian [id number] DOB: [birth year] DOA: 12/26/2019  PCP: Ann Held, DO  Admit date: 12/26/2019 Discharge date: 12/31/2019  Admitted From: Home  Disposition:Home   Recommendations for Outpatient Follow-up:  1. Follow up with PCP in 1-2 weeks 2. Please obtain BMP/CBC in one week your next doctors visit.  3. Started flecainide, metoprolol and Eliquis. 4. Outpatient follow-up with cardiology arranged.   Discharge Condition: Stable CODE STATUS: Full code Diet recommendation: Heart healthy  Brief/Interim Summary: 49 year old Caucasian female with past medical history significant for obesity and significant alcohol use. Patient was admitted with atrial fibrillation with rapid ventricular response.   Echocardiogram showed normal EF, elevated BNP.  CTA chest negative for PE.  Right upper quadrant is negative for acute cholecystitis.  Currently on Cardizem drip.  Cardiology team following planning for cardioversion.  Cardioversion was unsuccessful EP consulted who recommended starting patient on flecainide.  Patient spontaneously converted to normal sinus rhythm, she was cleared for discharge from cardiology standpoint outpatient follow-up.  Atrial fibrillation with RVR, resolved Dyspnea on exertion with elevated BNP with pulmonary edema -Patient is now in normal sinus rhythm after initiating flecainide.  Will discharge patient on flecainide 100 mg twice daily, Toprol-XL 25 mg and Eliquis -Follow-up outpatient cardiology and EP. -Echocardiogram shows EF of 50-55%.  Acute diastolic congestive heart failure with preserved ejection fraction, 55%.  Resolved -Now appears to be euvolemic.  History of alcohol use -Monitor for any signs of withdrawal  Hypokalemia -Improved  Transaminitis -Suspect from congestion alcohol use.  Ultrasound-negative  Obesity Body mass index is 33.3 kg/m. Recommend outpatient sleep study   Discharge  Diagnoses:  Active Problems:   Atrial fibrillation with RVR (HCC)   A-fib [facility])    Consultations:  Cardiology  Subjective: Feels great, spontaneously converted to normal sinus rhythm overnight.  Wishes to go home today.  Family at bedside Discharge Exam: Vitals:   12/31/19 0811 12/31/19 0848  BP: 97/72   Pulse: 90   Resp: 13 20  Temp: 97.9 F (36.6 C)   SpO2: 98%    Vitals:   12/31/19 0400 12/31/19 0600 12/31/19 0811 12/31/19 0848  BP: 103/78  97/72   Pulse:   90   Resp: 20  13 20   Temp: 97.9 F (36.6 C)  97.9 F (36.6 C)   TempSrc: Oral  Oral   SpO2:   98%   Weight:  88.4 kg    Height:        General: Pt is alert, awake, not in acute distress Cardiovascular: RRR, S1/S2 +, no rubs, no gallops Respiratory: CTA bilaterally, no wheezing, no rhonchi Abdominal: Soft, NT, ND, bowel sounds + Extremities: no edema, no cyanosis  Discharge Instructions   Allergies as of 12/31/2019      Reactions   Penicillins Other (See Comments)   Childhood allergy Did it involve swelling of the face/tongue/throat, SOB, or low BP? Unknown Did it involve sudden or severe rash/hives, skin peeling, or any reaction on the inside of your mouth or nose? Unknown Did you need to seek medical attention at a hospital or doctor's office? Unknown When did it last happen?young child If all above answers are "NO", may proceed with cephalosporin use.      Medication List    TAKE these medications   apixaban 5 MG Tabs tablet Commonly known as: ELIQUIS Take 1 tablet (5 mg total) by mouth 2 (two) times daily.   b complex vitamins tablet Take 1 tablet by mouth daily.  CALCIUM + D3 PO Take 1 tablet by mouth daily.   flecainide 100 MG tablet Commonly known as: TAMBOCOR Take 1 tablet (100 mg total) by mouth every 12 (twelve) hours.   metoprolol succinate 25 MG 24 hr tablet Commonly known as: TOPROL-XL Take 1 tablet (25 mg total) by mouth daily. Start taking on: January 01, 2020    OVER THE COUNTER MEDICATION Place 1 drop into both eyes daily. Simalasin Allergy Eye Relief - homeopathic   Vitamin C 500 MG Caps Take 100 mg by mouth daily.   Zinc 50 MG Caps Take 50 mg by mouth daily.      Follow-up Information    [facility] Follow up.   Specialty: Cardiology Why: 01/11/2020 @ 10:00AM with C. Marlene Lard, [place] Contact information: [address] [id number] mc [address] [place] [phone]       Constance Haw, MD Follow up.   Specialty: Cardiology Why: 02/23/2020 @ 11:15AM Contact information: [address] [place] [address] [phone]        Geralynn Rile, MD Follow up on 01/07/2020.   Specialties: Internal Medicine, Cardiology, Radiology Why: Please arrive 15 minutes early for your 11:40am post-hospital cardiology follow-up appointment Contact information: [place] [address] [phone]          Allergies  Allergen Reactions  . Penicillins Other (See Comments)    Childhood allergy Did it involve swelling of the face/tongue/throat, SOB, or low BP? Unknown Did it involve sudden or severe rash/hives, skin peeling, or any reaction on the inside of your mouth or nose? Unknown Did you need to seek medical attention at a hospital or doctor's office? Unknown When did it last happen?young child If all above answers are "NO", may proceed with cephalosporin use.    You were cared for by a hospitalist during your hospital stay. If you have any questions about your discharge medications or the care you received while you were in the hospital after you are discharged, you can call the unit and asked to speak with the hospitalist on call if the hospitalist that took care of you is not available. Once you are discharged, your primary care physician will handle any further medical issues. Please note that no refills for any discharge medications will be  authorized once you are discharged, as it is imperative that you return to your primary care physician (or establish a relationship with a primary care physician if you do not have one) for your aftercare needs so that they can reassess your need for medications and monitor your lab values.   Procedures/Studies: CT Angio Chest PE W/Cm &/Or Wo Cm  Result Date: 12/27/2019 CLINICAL DATA:  PE suspected, low/intermediate prob, positive D-dimer Shortness of breath for 5 weeks, worse with activity and lying flat. EXAM: CT ANGIOGRAPHY CHEST WITH CONTRAST TECHNIQUE: Multidetector CT imaging of the chest was performed using the standard protocol during bolus administration of intravenous contrast. Multiplanar CT image reconstructions and MIPs were obtained to evaluate the vascular anatomy. CONTRAST:  56mL OMNIPAQUE IOHEXOL 350 MG/ML SOLN COMPARISON:  Lung bases from abdominal CT earlier today. Chest radiograph earlier today. FINDINGS: Cardiovascular: There are no filling defects within the pulmonary arteries to suggest pulmonary embolus. Upper normal heart size. Thoracic aorta is normal in caliber. Mild aortic atherosclerosis. No evidence of aortic dissection. No pericardial effusion. Mediastinum/Nodes: Small mediastinal and hilar lymph nodes are not enlarged by size criteria. No esophageal wall thickening. No visualized pulmonary  nodule. Lungs/Pleura: Small to moderate bilateral pleural effusions. Adjacent atelectasis in both lower lobes. Basilar septal thickening. Peribronchial thickening which may be congestive. Scattered nodular ground-glass opacities in the mid lower lung zones, series 5 images 53, 55, and image 70. Upper Abdomen: Assessed on abdominal CT earlier today. No interval change. Musculoskeletal: There are no acute or suspicious osseous abnormalities. Review of the MIP images confirms the above findings. IMPRESSION: 1. No pulmonary embolus. 2. Small to moderate bilateral pleural effusions, adjacent  atelectasis. Basilar septal thickening suspicious for pulmonary edema. Bronchial thickening, suspect this is congestive. 3. Nodular ground-glass opacities in the bilateral mid lower lung zones, nonspecific for pulmonary edema versus infectious or inflammatory etiology. COVID pneumonia can produce a similar appearance, though this is slightly atypical. Aortic Atherosclerosis (ICD10-I70.0). Electronically Signed   By: Keith Rake M.D.   On: 12/27/2019 00:11   CT Abdomen Pelvis W Contrast  Result Date: 12/26/2019 CLINICAL DATA:  Shortness of breath and abdominal distension EXAM: CT ABDOMEN AND PELVIS WITH CONTRAST TECHNIQUE: Multidetector CT imaging of the abdomen and pelvis was performed using the standard protocol following bolus administration of intravenous contrast. CONTRAST:  162mL OMNIPAQUE IOHEXOL 300 MG/ML  SOLN COMPARISON:  None. FINDINGS: Lower chest: The visualized heart size within normal limits. No pericardial fluid/thickening. There is small bilateral pleural effusions, right greater than left. Patchy airspace opacities are seen at both lung bases, right greater than left. Hepatobiliary: The liver is normal in density without focal abnormality.The main portal vein is patent. There appears to be a small amount of pericholecystic fluid present. No definite layering cough calcified gallstones are seen. There is question of mild fat stranding changes seen. No intrahepatic biliary ductal dilatation is seen. Pancreas: Unremarkable. No pancreatic ductal dilatation or surrounding inflammatory changes. Spleen: Normal in size without focal abnormality. Adrenals/Urinary Tract: Both adrenal glands appear normal. The kidneys and collecting system appear normal without evidence of urinary tract calculus or hydronephrosis. Bladder is unremarkable. Stomach/Bowel: The stomach, small bowel, and colon are normal in appearance. No inflammatory changes, wall thickening, or obstructive findings. Vascular/Lymphatic:  There are no enlarged mesenteric, retroperitoneal, or pelvic lymph nodes. No significant vascular findings are present. Reproductive: The uterus and adnexa are unremarkable. A small amount of physiologic free fluid in the cul-de-sac. Other: Small amount of inflammatory changes seen tracking into the right lower quadrant. Musculoskeletal: No acute or significant osseous findings. IMPRESSION: 1. Small bilateral pleural effusions, right greater than left. 2. Patchy airspace opacities at both lung bases which could be due to atelectasis or infectious etiology. 3. Small amount of pericholecystic fluid and question of mild fat stranding changes around the gallbladder. However no definite calcified gallstones are seen. If further evaluation is required would recommend right upper quadrant ultrasound. Electronically Signed   By: Prudencio Pair M.D.   On: 12/26/2019 22:40   DG Chest Port 1 View  Result Date: 12/26/2019 CLINICAL DATA:  Shortness of breath EXAM: PORTABLE CHEST 1 VIEW COMPARISON:  May 07, 2007 FINDINGS: The heart size and mediastinal contours are within normal limits. There is hazy airspace opacity seen at the right lung base. No pleural effusion is seen. No acute osseous abnormality. IMPRESSION: Hazy airspace opacity at the right lung base which could be due to atelectasis or infectious etiology. Electronically Signed   By: Prudencio Pair M.D.   On: 12/26/2019 20:42   ECHOCARDIOGRAM COMPLETE  Result Date: 12/28/2019    ECHOCARDIOGRAM REPORT   Patient Name:   Cindy Christian Date of Exam: 12/28/2019 Medical  Rec #:  [id number]   Height:       64.0 in Accession #:    [id number]  Weight:       194.6 lb Date of Birth:  [birth year]   BSA:          1.934 m Patient Age:    49 years    BP:           107/74 mmHg Patient Gender: F           HR:           97 bpm. Exam Location:  Inpatient Procedure: 2D Echo Indications:    atrial fibrillation  History:        Patient has no prior history of Echocardiogram examinations.                  Risk Factors:Former Smoker.  Sonographer:    Jannett Celestine RDCS (AE) Referring Phys: [id number] Lequita Halt IMPRESSIONS  1. Technically difficult study due to atrial fibrillation and poor windows. Grossly normal LV systolic function. Left ventricular ejection fraction, by estimation, is 55 to 60%. The left ventricle has normal function. The left ventricle has no regional wall motion abnormalities. There is mild left ventricular hypertrophy. Left ventricular diastolic parameters are indeterminate.  2. Right ventricular systolic function is normal. The right ventricular size is mildly enlarged.  3. Right atrial size was mildly dilated.  4. The mitral valve is normal in structure. Mild mitral valve regurgitation.  5. The aortic valve was not well visualized. Aortic valve regurgitation is not visualized. AV gradients were not measured FINDINGS  Left Ventricle: Left ventricular ejection fraction, by estimation, is 55 to 60%. The left ventricle has normal function. The left ventricle has no regional wall motion abnormalities. The left ventricular internal cavity size was normal in size. There is  mild left ventricular hypertrophy. Left ventricular diastolic parameters are indeterminate. Right Ventricle: The right ventricular size is mildly enlarged. Right vetricular wall thickness was not assessed. Right ventricular systolic function is normal. Left Atrium: Left atrial size was normal in size. Right Atrium: Right atrial size was mildly dilated. Pericardium: There is no evidence of pericardial effusion. Mitral Valve: The mitral valve is normal in structure. Mild mitral valve regurgitation. Tricuspid Valve: The tricuspid valve is normal in structure. Tricuspid valve regurgitation is trivial. Aortic Valve: The aortic valve was not well visualized. Aortic valve regurgitation is not visualized. Pulmonic Valve: The pulmonic valve was not well visualized. Pulmonic valve regurgitation is trivial. Aorta: The aortic root  is normal in size and structure. IAS/Shunts: The interatrial septum was not well visualized.  LEFT VENTRICLE PLAX 2D LVIDd:         4.60 cm LVIDs:         3.00 cm LV PW:         1.00 cm LV IVS:        0.80 cm LVOT diam:     1.90 cm LV SV:         47 LV SV Index:   24 LVOT Area:     2.84 cm  RIGHT VENTRICLE RV S prime:     13.90 cm/s TAPSE (M-mode): 1.7 cm LEFT ATRIUM           Index       RIGHT ATRIUM           Index LA diam:      4.00 cm 2.07 cm/m  RA Area:     21.50 cm  LA Vol (A4C): 63.8 ml 32.99 ml/m RA Volume:   63.10 ml  32.63 ml/m  AORTIC VALVE LVOT Vmax:   95.30 cm/s LVOT Vmean:  68.500 cm/s LVOT VTI:    0.167 m  AORTA Ao Root diam: 2.60 cm MITRAL VALVE               TRICUSPID VALVE MV Area (PHT): 2.96 cm    TR Peak grad:   25.2 mmHg MV Decel Time: 256 msec    TR Vmax:        251.00 cm/s MV E velocity: 90.00 cm/s                            SHUNTS                            Systemic VTI:  0.17 m                            Systemic Diam: 1.90 cm Oswaldo Milian MD Electronically signed by Oswaldo Milian MD Signature Date/Time: 12/28/2019/11:03:43 AM    Final    ECHO TEE  Result Date: 12/31/2019    TRANSESOPHOGEAL ECHO REPORT   Patient Name:   Cindy Christian Date of Exam: 12/30/2019 Medical Rec #:  [id number]   Height:       64.0 in Accession #:    [id number]  Weight:       194.0 lb Date of Birth:  11-10-1970   BSA:          1.931 m Patient Age:    6 years    BP:           106/85 mmHg Patient Gender: F           HR:           100 bpm. Exam Location:  Inpatient Procedure: Transesophageal Echo Indications:    atrial fibrillation. cardioversion  History:        Patient has prior history of Echocardiogram examinations, most                 recent 12/28/2019. Risk Factors:Former Smoker.  Sonographer:    Jannett Celestine RDCS (AE) Referring Phys: [id number] Valinda: After discussion of the risks and benefits of a TEE, an informed consent was obtained from the patient. TEE procedure time  was 28 minutes. The transesophogeal probe was passed without difficulty through the esophogus of the patient. Imaged were obtained with the patient in a left lateral decubitus position. Local oropharyngeal anesthetic was provided with Cetacaine. Sedation performed by different physician. Image quality was good. The patient's vital signs; including heart rate, blood pressure, and oxygen saturation; remained stable throughout the procedure. The patient developed no complications during the procedure. A successful direct current cardioversion was performed at 120, 200 J joules with 2 attempts. IMPRESSIONS  1. Left ventricular ejection fraction, by estimation, is 50%. The left ventricle has low normal with beat to beat variability in function. Left ventricular endocardial border not optimally defined to evaluate regional wall motion.  2. Right ventricular systolic function is normal. The right ventricular size is normal.  3. Left atrial size was mildly dilated. No left atrial/left atrial appendage thrombus was detected.  4. Right atrial size was mildly dilated.  5. The mitral valve is grossly normal. Mild to moderate mitral valve regurgitation.  6.  Tricuspid valve regurgitation is mild to moderate.  7. The aortic valve is tricuspid. Aortic valve regurgitation is trivial. No aortic stenosis is present.  8. Evidence of atrial level shunting detected by color flow Doppler. Agitated saline contrast bubble study was positive with shunting observed within 3-6 cardiac cycles suggestive of interatrial shunt. There is a small patent foramen ovale with bidirectional shunting across atrial septum. FINDINGS  Left Ventricle: Left ventricular ejection fraction, by estimation, is 50%. The left ventricle has low normal with beat to beat variability in function. Left ventricular endocardial border not optimally defined to evaluate regional wall motion. The left ventricular internal cavity size was normal in size. Right Ventricle: The  right ventricular size is normal. Right ventricular systolic function is normal. Left Atrium: Left atrial size was mildly dilated. No left atrial/left atrial appendage thrombus was detected. Right Atrium: Right atrial size was mildly dilated. Pericardium: There is no evidence of pericardial effusion. Mitral Valve: The mitral valve is grossly normal. Mild to moderate mitral valve regurgitation. Tricuspid Valve: The tricuspid valve is normal in structure. Tricuspid valve regurgitation is mild to moderate. Aortic Valve: The aortic valve is tricuspid. Aortic valve regurgitation is trivial. No aortic stenosis is present. Pulmonic Valve: The pulmonic valve was normal in structure. Pulmonic valve regurgitation is trivial. Aorta: The aortic root, ascending aorta, aortic arch and descending aorta are all structurally normal, with no evidence of dilitation or obstruction. IAS/Shunts: There is redundancy of the interatrial septum. Evidence of atrial level shunting detected by color flow Doppler. Agitated saline contrast was given intravenously to evaluate for intracardiac shunting. Agitated saline contrast bubble study was  positive with shunting observed within 3-6 cardiac cycles suggestive of interatrial shunt. A small patent foramen ovale is detected with bidirectional shunting across atrial septum.  AORTIC VALVE LVOT Vmax:   97.20 cm/s LVOT Vmean:  68.600 cm/s LVOT VTI:    0.187 m  AORTA Ao Asc diam: 3.00 cm  SHUNTS Systemic VTI: 0.19 m Cherlynn Kaiser MD Electronically signed by Cherlynn Kaiser MD Signature Date/Time: 12/31/2019/5:26:35 AM    Final    US Abdomen Limited RUQ  Result Date: 12/28/2019 CLINICAL DATA:  Elevated liver enzymes EXAM: ULTRASOUND ABDOMEN LIMITED RIGHT UPPER QUADRANT COMPARISON:  None. FINDINGS: Gallbladder: No gallstones or wall thickening visualized. There is no pericholecystic fluid. No sonographic Murphy sign noted by sonographer. Common bile duct: Diameter: 3 mm. No intrahepatic or  extrahepatic biliary duct dilatation. Liver: No focal lesion identified. Within normal limits in parenchymal echogenicity. Portal vein is patent on color Doppler imaging with normal direction of blood flow towards the liver. Other: There is a right pleural effusion. IMPRESSION: Right pleural effusion.  Study otherwise unremarkable. Electronically Signed   By: Lowella Grip III M.D.   On: 12/28/2019 07:59      The results of significant diagnostics from this hospitalization (including imaging, microbiology, ancillary and laboratory) are listed below for reference.     Microbiology: Recent Results (from the past 240 hour(s))  SARS CORONAVIRUS 2 (TAT 6-24 HRS) Nasopharyngeal Nasopharyngeal Swab     Status: None   Collection Time: 12/27/19 12:31 AM   Specimen: Nasopharyngeal Swab  Result Value Ref Range Status   SARS Coronavirus 2 NEGATIVE NEGATIVE Final    Comment: (NOTE) SARS-CoV-2 target nucleic acids are NOT DETECTED. The SARS-CoV-2 RNA is generally detectable in upper and lower respiratory specimens during the acute phase of infection. Negative results do not preclude SARS-CoV-2 infection, do not rule out co-infections with other pathogens, and should  not be used as the sole basis for treatment or other patient management decisions. Negative results must be combined with clinical observations, patient history, and epidemiological information. The expected result is Negative. Fact Sheet for Patients: SugarRoll.be Fact Sheet for Healthcare Providers: https://www.woods-mathews.com/ This test is not yet approved or cleared by the [place] FDA and  has been authorized for detection and/or diagnosis of SARS-CoV-2 by FDA under an Emergency Use Authorization (EUA). This EUA will remain  in effect (meaning this test can be used) for the duration of the COVID-19 declaration under Section 56 4(b)(1) of the Act, 21 U.S.C. section 360bbb-3(b)(1),  unless the authorization is terminated or revoked sooner. Performed at [facility], [place] [address] [place], [place] [address]   MRSA PCR Screening     Status: None   Collection Time: 12/27/19  7:06 PM   Specimen: Nasopharyngeal  Result Value Ref Range Status   MRSA by PCR NEGATIVE NEGATIVE Final    Comment:        The GeneXpert MRSA Assay (FDA approved for NASAL specimens only), is one component of a comprehensive MRSA colonization surveillance program. It is not intended to diagnose MRSA infection nor to guide or monitor treatment for MRSA infections. Performed at [facility], [place] [address] [place], [place] [address]      Labs: BNP (last 3 results) Recent Labs    12/26/19 1957  BNP [id number]*   Basic Metabolic Panel: Recent Labs  Lab 12/28/19 0246 12/28/19 1559 12/29/19 0217 12/30/19 0308 12/31/19 0228  NA 140 136 140 137 140  K 2.8* 3.7 4.2 3.7 4.0  CL 104 106 110 107 110  CO2 23 22 23 22  21*  GLUCOSE 101* 125* 106* 88 98  BUN 5* <5* <5* <5* 6  CREATININE 0.69 0.65 0.62 0.67 0.60  CALCIUM 8.7* 8.6* 8.6* 8.7* 8.8*  MG 1.6*  --  1.9 1.9 1.8  PHOS 4.4 3.0 3.3  --   --    Liver Function Tests: Recent Labs  Lab 12/26/19 1957 12/28/19 1559 12/29/19 0217  AST 63*  --   --   ALT 58*  --   --   ALKPHOS 52  --   --   BILITOT 0.9  --   --   PROT 7.2  --   --   ALBUMIN 3.9 3.6 3.3*   Recent Labs  Lab 12/26/19 2248  LIPASE 29   No results for input(s): AMMONIA in the last 168 hours. CBC: Recent Labs  Lab 12/28/19 0246 12/29/19 0217 12/30/19 0308 12/31/19 0228  WBC 8.2 6.9 5.2 5.4  HGB 11.5* 11.6* 12.0 11.9*  HCT 35.1* 36.5 37.9 37.5  MCV 95.6 97.6 97.4 98.7  PLT 251 255 275 268   Cardiac Enzymes: No results for input(s): CKTOTAL, CKMB, CKMBINDEX, TROPONINI in the last 168 hours. BNP: Invalid input(s): POCBNP CBG: Recent Labs  Lab 12/29/19 0009 12/29/19 1144 12/29/19 1607 12/29/19 2358 12/30/19 0632  GLUCAP 100* 123* 115*  103* 88   D-Dimer No results for input(s): DDIMER in the last 72 hours. Hgb A1c No results for input(s): HGBA1C in the last 72 hours. Lipid Profile No results for input(s): CHOL, HDL, LDLCALC, TRIG, CHOLHDL, LDLDIRECT in the last 72 hours. Thyroid function studies No results for input(s): TSH, T4TOTAL, T3FREE, THYROIDAB in the last 72 hours.  Invalid input(s): FREET3 Anemia work up No results for input(s): VITAMINB12, FOLATE, FERRITIN, TIBC, IRON, RETICCTPCT in the last 72 hours. Urinalysis  Component Value Date/Time   COLORURINE YELLOW 12/26/2019 2130   APPEARANCEUR CLEAR 12/26/2019 2130   LABSPEC 1.010 12/26/2019 2130   PHURINE 6.0 12/26/2019 2130   GLUCOSEU NEGATIVE 12/26/2019 2130   HGBUR NEGATIVE 12/26/2019 2130   BILIRUBINUR NEGATIVE 12/26/2019 2130   BILIRUBINUR neg 08/02/2017 1057   KETONESUR NEGATIVE 12/26/2019 2130   PROTEINUR NEGATIVE 12/26/2019 2130   UROBILINOGEN 0.2 08/02/2017 1057   NITRITE NEGATIVE 12/26/2019 2130   LEUKOCYTESUR NEGATIVE 12/26/2019 2130   Sepsis Labs Invalid input(s): PROCALCITONIN,  WBC,  LACTICIDVEN Microbiology Recent Results (from the past 240 hour(s))  SARS CORONAVIRUS 2 (TAT 6-24 HRS) Nasopharyngeal Nasopharyngeal Swab     Status: None   Collection Time: 12/27/19 12:31 AM   Specimen: Nasopharyngeal Swab  Result Value Ref Range Status   SARS Coronavirus 2 NEGATIVE NEGATIVE Final    Comment: (NOTE) SARS-CoV-2 target nucleic acids are NOT DETECTED. The SARS-CoV-2 RNA is generally detectable in upper and lower respiratory specimens during the acute phase of infection. Negative results do not preclude SARS-CoV-2 infection, do not rule out co-infections with other pathogens, and should not be used as the sole basis for treatment or other patient management decisions. Negative results must be combined with clinical observations, patient history, and epidemiological information. The expected result is Negative. Fact Sheet for  Patients: SugarRoll.be Fact Sheet for Healthcare Providers: https://www.woods-mathews.com/ This test is not yet approved or cleared by the [place] FDA and  has been authorized for detection and/or diagnosis of SARS-CoV-2 by FDA under an Emergency Use Authorization (EUA). This EUA will remain  in effect (meaning this test can be used) for the duration of the COVID-19 declaration under Section 56 4(b)(1) of the Act, 21 U.S.C. section 360bbb-3(b)(1), unless the authorization is terminated or revoked sooner. Performed at [facility], [place] [address], [place], [place] [address]   MRSA PCR Screening     Status: None   Collection Time: 12/27/19  7:06 PM   Specimen: Nasopharyngeal  Result Value Ref Range Status   MRSA by PCR NEGATIVE NEGATIVE Final    Comment:        The GeneXpert MRSA Assay (FDA approved for NASAL specimens only), is one component of a comprehensive MRSA colonization surveillance program. It is not intended to diagnose MRSA infection nor to guide or monitor treatment for MRSA infections. Performed at [facility], [place] [address] [place], [place] [address]      Time coordinating discharge:  I have spent 35 minutes face to face with the patient and on the ward discussing the patients care, assessment, plan and disposition with other care givers. >50% of the time was devoted counseling the patient about the risks and benefits of treatment/Discharge disposition and coordinating care.   SIGNED:   Damita Lack, MD  [facility] 12/31/2019, 11:05 AM   If 7PM-7AM, please contact night-coverage

## 2019-12-31 NOTE — TOC Transition Note (Signed)
Transition of Care [facility]) - CM/SW Discharge Note   Patient Details  Name: Cindy Christian MRN: [id number] Date of Birth: [birth year]  [facility] CM/SW Contact:  Zenon Mayo, RN Phone Number: 12/31/2019, 9:17 AM   Clinical Narrative:    Patient for dc today, NCM notified MD to send the meds to [place], they will fill the eliquis for 30 day free.  NCM asked Dr. Marisue Ivan if patient would just need eliquis for 30 days and he states yes.  NCM spoke with patient and informed her of this information and that she will have a follow up with her cardiologist before she runs out of eliquis.  Patient states she will make her a follow up apt with her PCP , she is not ready to make that apt yet so she will handle that.    Final next level of care: Home/Self Care Barriers to Discharge: No Barriers Identified   Patient Goals and CMS Choice Patient states their goals for this hospitalization and ongoing recovery are:: get better   Choice offered to / list presented to : NA  Discharge Placement                       Discharge Plan and Services                          HH Arranged: NA          Social Determinants of Health (SDOH) Interventions     Readmission Risk Interventions No flowsheet data found.

## 2019-12-31 NOTE — Plan of Care (Signed)
  Problem: Education: Goal: Individualized Educational Video(s) Outcome: Completed/Met   Problem: Cardiac: Goal: Ability to achieve and maintain adequate cardiopulmonary perfusion will improve Outcome: Completed/Met   Problem: Health Behavior/Discharge Planning: Goal: Ability to safely manage health-related needs after discharge will improve Outcome: Completed/Met   Problem: Education: Goal: Knowledge of General Education information will improve Description: Including pain rating scale, medication(s)/side effects and non-pharmacologic comfort measures Outcome: Completed/Met   Problem: Health Behavior/Discharge Planning: Goal: Ability to manage health-related needs will improve Outcome: Completed/Met   Problem: Clinical Measurements: Goal: Ability to maintain clinical measurements within normal limits will improve Outcome: Completed/Met Goal: Will remain free from infection Outcome: Completed/Met Goal: Diagnostic test results will improve Outcome: Completed/Met Goal: Respiratory complications will improve Outcome: Completed/Met Goal: Cardiovascular complication will be avoided Outcome: Completed/Met   Problem: Activity: Goal: Risk for activity intolerance will decrease Outcome: Completed/Met   Problem: Nutrition: Goal: Adequate nutrition will be maintained Outcome: Completed/Met   Problem: Coping: Goal: Level of anxiety will decrease Outcome: Completed/Met   Problem: Elimination: Goal: Will not experience complications related to bowel motility Outcome: Completed/Met Goal: Will not experience complications related to urinary retention Outcome: Completed/Met   Problem: Pain Managment: Goal: General experience of comfort will improve Outcome: Completed/Met   Problem: Safety: Goal: Ability to remain free from injury will improve Outcome: Completed/Met   Problem: Skin Integrity: Goal: Risk for impaired skin integrity will decrease Outcome: Completed/Met

## 2019-12-31 NOTE — Progress Notes (Signed)
Patient converted to NSR and EKG done and placed in chart. Patient laying quitely in bed and VSS. Will notify MD.

## 2019-12-31 NOTE — Progress Notes (Signed)
Cardiology Progress Note  Patient ID: Cindy Christian MRN: [id number] DOB: Nov 18, 1970 Date of Encounter: 12/31/2019  Primary Cardiologist: No primary care provider on file.  Subjective  Back in NSR.  QRS 104.  We will plan for outpatient follow-up next week.  ROS:  All other ROS reviewed and negative. Pertinent positives noted in the HPI.     Inpatient Medications  Scheduled Meds: . apixaban  5 mg Oral BID  . docusate sodium  200 mg Oral QHS  . flecainide  100 mg Oral Q12H  . metoprolol succinate  25 mg Oral Daily  . sodium chloride flush  3 mL Intravenous Q12H   Continuous Infusions: . sodium chloride Stopped (12/30/19 0932)   PRN Meds: sodium chloride, acetaminophen, LORazepam, ondansetron (ZOFRAN) IV, sodium chloride flush   Vital Signs   Vitals:   12/31/19 0000 12/31/19 0400 12/31/19 0600 12/31/19 0811  BP:  103/78  97/72  Pulse:    90  Resp: (!) 23 20  13   Temp:  97.9 F (36.6 C)  97.9 F (36.6 C)  TempSrc:  Oral  Oral  SpO2:    98%  Weight:   88.4 kg   Height:        Intake/Output Summary (Last 24 hours) at 12/31/2019 0824 Last data filed at 12/31/2019 0811 Gross per 24 hour  Intake 985 ml  Output --  Net 985 ml   Last 3 Weights 12/31/2019 12/30/2019 12/30/2019  Weight (lbs) 194 lb 14.2 oz 194 lb 0.1 oz 194 lb 0.1 oz  Weight (kg) 88.4 kg 88 kg 88 kg      Telemetry  Overnight telemetry shows normal sinus rhythm with heart rate in 80s, which I personally reviewed.   ECG  The most recent ECG shows normal sinus rhythm incomplete right bundle branch block, QRS 104, which I personally reviewed.   Physical Exam   Vitals:   12/31/19 0000 12/31/19 0400 12/31/19 0600 12/31/19 0811  BP:  103/78  97/72  Pulse:    90  Resp: (!) 23 20  13   Temp:  97.9 F (36.6 C)  97.9 F (36.6 C)  TempSrc:  Oral  Oral  SpO2:    98%  Weight:   88.4 kg   Height:         Intake/Output Summary (Last 24 hours) at 12/31/2019 0824 Last data filed at 12/31/2019 0811 Gross per 24 hour   Intake 985 ml  Output --  Net 985 ml    Last 3 Weights 12/31/2019 12/30/2019 12/30/2019  Weight (lbs) 194 lb 14.2 oz 194 lb 0.1 oz 194 lb 0.1 oz  Weight (kg) 88.4 kg 88 kg 88 kg    Body mass index is 33.45 kg/m.   General: Well nourished, well developed, in no acute distress Head: Atraumatic, normal size  Eyes: PEERLA, EOMI  Neck: Supple, no JVD Endocrine: No thryomegaly Cardiac: Normal S1, S2; RRR; no murmurs, rubs, or gallops Lungs: Clear to auscultation bilaterally, no wheezing, rhonchi or rales  Abd: Soft, nontender, no hepatomegaly  Ext: No edema, pulses 2+ Musculoskeletal: No deformities, BUE and BLE strength normal and equal Skin: Warm and dry, no rashes   Neuro: Alert and oriented to person, place, time, and situation, CNII-XII grossly intact, no focal deficits  Psych: Normal mood and affect   Labs  High Sensitivity Troponin:   Recent Labs  Lab 12/26/19 1957 12/26/19 2240  TROPONINIHS 7 7     Cardiac EnzymesNo results for input(s): TROPONINI in the last 168 hours.  No results for input(s): TROPIPOC in the last 168 hours.  Chemistry Recent Labs  Lab 12/26/19 1957 12/28/19 0246 12/28/19 1559 12/28/19 1559 12/29/19 0217 12/30/19 0308 12/31/19 0228  NA 137   < > 136   < > 140 137 140  K 3.6   < > 3.7   < > 4.2 3.7 4.0  CL 106   < > 106   < > 110 107 110  CO2 20*   < > 22   < > 23 22 21*  GLUCOSE 132*   < > 125*   < > 106* 88 98  BUN 15   < > <5*   < > <5* <5* 6  CREATININE 0.90   < > 0.65   < > 0.62 0.67 0.60  CALCIUM 9.3   < > 8.6*   < > 8.6* 8.7* 8.8*  PROT 7.2  --   --   --   --   --   --   ALBUMIN 3.9  --  3.6  --  3.3*  --   --   AST 63*  --   --   --   --   --   --   ALT 58*  --   --   --   --   --   --   ALKPHOS 52  --   --   --   --   --   --   BILITOT 0.9  --   --   --   --   --   --   GFRNONAA >60   < > >60   < > >60 >60 >60  GFRAA >60   < > >60   < > >60 >60 >60  ANIONGAP 11   < > 8   < > 7 8 9    < > = values in this interval not displayed.     Hematology Recent Labs  Lab 12/29/19 0217 12/30/19 0308 12/31/19 0228  WBC 6.9 5.2 5.4  RBC 3.74* 3.89 3.80*  HGB 11.6* 12.0 11.9*  HCT 36.5 37.9 37.5  MCV 97.6 97.4 98.7  MCH 31.0 30.8 31.3  MCHC 31.8 31.7 31.7  RDW 13.2 13.1 13.0  PLT 255 275 268   BNP Recent Labs  Lab 12/26/19 1957  BNP 179.7*    DDimer  Recent Labs  Lab 12/26/19 1957  DDIMER 1.19*     Radiology  ECHO TEE  Result Date: 12/31/2019    TRANSESOPHOGEAL ECHO REPORT   Patient Name:   ADALYNA DIMERCURIO Date of Exam: 12/30/2019 Medical Rec #:  [id number]   Height:       64.0 in Accession #:    [id number]  Weight:       194.0 lb Date of Birth:  Jul 05, 1971   BSA:          1.931 m Patient Age:    49 years    BP:           106/85 mmHg Patient Gender: F           HR:           100 bpm. Exam Location:  Inpatient Procedure: Transesophageal Echo Indications:    atrial fibrillation. cardioversion  History:        Patient has prior history of Echocardiogram examinations, most                 recent 12/28/2019. Risk Factors:Former Smoker.  Sonographer:  Jannett Celestine RDCS (AE) Referring Phys: [id number] Cassie Freer O'NEAL PROCEDURE: After discussion of the risks and benefits of a TEE, an informed consent was obtained from the patient. TEE procedure time was 28 minutes. The transesophogeal probe was passed without difficulty through the esophogus of the patient. Imaged were obtained with the patient in a left lateral decubitus position. Local oropharyngeal anesthetic was provided with Cetacaine. Sedation performed by different physician. Image quality was good. The patient's vital signs; including heart rate, blood pressure, and oxygen saturation; remained stable throughout the procedure. The patient developed no complications during the procedure. A successful direct current cardioversion was performed at 120, 200 J joules with 2 attempts. IMPRESSIONS  1. Left ventricular ejection fraction, by estimation, is 50%. The left ventricle has low  normal with beat to beat variability in function. Left ventricular endocardial border not optimally defined to evaluate regional wall motion.  2. Right ventricular systolic function is normal. The right ventricular size is normal.  3. Left atrial size was mildly dilated. No left atrial/left atrial appendage thrombus was detected.  4. Right atrial size was mildly dilated.  5. The mitral valve is grossly normal. Mild to moderate mitral valve regurgitation.  6. Tricuspid valve regurgitation is mild to moderate.  7. The aortic valve is tricuspid. Aortic valve regurgitation is trivial. No aortic stenosis is present.  8. Evidence of atrial level shunting detected by color flow Doppler. Agitated saline contrast bubble study was positive with shunting observed within 3-6 cardiac cycles suggestive of interatrial shunt. There is a small patent foramen ovale with bidirectional shunting across atrial septum. FINDINGS  Left Ventricle: Left ventricular ejection fraction, by estimation, is 50%. The left ventricle has low normal with beat to beat variability in function. Left ventricular endocardial border not optimally defined to evaluate regional wall motion. The left ventricular internal cavity size was normal in size. Right Ventricle: The right ventricular size is normal. Right ventricular systolic function is normal. Left Atrium: Left atrial size was mildly dilated. No left atrial/left atrial appendage thrombus was detected. Right Atrium: Right atrial size was mildly dilated. Pericardium: There is no evidence of pericardial effusion. Mitral Valve: The mitral valve is grossly normal. Mild to moderate mitral valve regurgitation. Tricuspid Valve: The tricuspid valve is normal in structure. Tricuspid valve regurgitation is mild to moderate. Aortic Valve: The aortic valve is tricuspid. Aortic valve regurgitation is trivial. No aortic stenosis is present. Pulmonic Valve: The pulmonic valve was normal in structure. Pulmonic valve  regurgitation is trivial. Aorta: The aortic root, ascending aorta, aortic arch and descending aorta are all structurally normal, with no evidence of dilitation or obstruction. IAS/Shunts: There is redundancy of the interatrial septum. Evidence of atrial level shunting detected by color flow Doppler. Agitated saline contrast was given intravenously to evaluate for intracardiac shunting. Agitated saline contrast bubble study was  positive with shunting observed within 3-6 cardiac cycles suggestive of interatrial shunt. A small patent foramen ovale is detected with bidirectional shunting across atrial septum.  AORTIC VALVE LVOT Vmax:   97.20 cm/s LVOT Vmean:  68.600 cm/s LVOT VTI:    0.187 m  AORTA Ao Asc diam: 3.00 cm  SHUNTS Systemic VTI: 0.19 m Cherlynn Kaiser MD Electronically signed by Cherlynn Kaiser MD Signature Date/Time: 12/31/2019/5:26:35 AM    Final     Cardiac Studies  TTE 12/28/2019 1. Technically difficult study due to atrial fibrillation and poor  windows. Grossly normal LV systolic function. Left ventricular ejection  fraction, by estimation, is 55 to 60%.  The left ventricle has normal  function. The left ventricle has no regional  wall motion abnormalities. There is mild left ventricular hypertrophy.  Left ventricular diastolic parameters are indeterminate.  2. Right ventricular systolic function is normal. The right ventricular  size is mildly enlarged.  3. Right atrial size was mildly dilated.  4. The mitral valve is normal in structure. Mild mitral valve  regurgitation.  5. The aortic valve was not well visualized. Aortic valve regurgitation  is not visualized. AV gradients were not measured   Patient Profile  Ms. Holsopple is a 49 year old female without significant medical history who was admitted with new onset atrial fibrillation with RVR as well as acute decompensated diastolic heart failure.  Assessment & Plan   1. New onset atrial fibrillation with RVR:  --A1c 5.5.  TSH  5.2, free T4 normal -Echo with EF 50-55% normal left atrial size. -Likely has some alcohol abuse she was dealing with.  Has stopped drinking for 1 month.  No signs of withdrawal.  She will refrain from alcohol moving forward. -CHADSVASC=1 -TEE/cardioversion was unsuccessful 12/30/2019.  She did briefly go back in normal sinus rhythm. -converted to NSR with flecanide 300 mg  -will go home on flecanide 100 mg BID and metoprolol succinate 25 mg QD -will arrange follow-up with me next week for repeat EKG. We will arrange outpatient ETT and sleep study -appreciate EP recommendations   2.  Acute diastolic heart failure -I have suspect this was related to rapid A. fib.  Her rates were in the 190s on admission.  She has been given Lasix with good urine output. -Euvolemic on examination  [facility] will sign off.   Medication Recommendations: Flecainide 100 mg twice daily, metoprolol succinate 25 mg daily, Eliquis 5 mg twice daily Other recommendations (labs, testing, etc): We will arrange outpatient follow-up for repeat EKG, exercise treadmill stress test, sleep study Follow up as an outpatient: We will arrange follow-up  For questions or updates, please contact [place] Please consult [email or web address] for contact info under   Time Spent with Patient: I have spent a total of 35 minutes with patient reviewing hospital notes, telemetry, EKGs, labs and examining the patient as well as establishing an assessment and plan that was discussed with the patient.  > 50% of time was spent in direct patient care.    Signed, Addison Naegeli. Audie Box, [place]  12/31/2019 8:24 AM

## 2020-01-01 ENCOUNTER — Telehealth: Payer: Self-pay | Admitting: Cardiovascular Disease

## 2020-01-01 NOTE — Telephone Encounter (Signed)
Called patient to schedule ETT ordered by Roby Lofts, PA---Informed patient she will need to have a COVID screening prior to testing.. Patient states she has been in the hospital and had COVID scareening on 12/27/19.  Has been at home since discharge.  Patient understands she will need to stay at homes until after ETT.

## 2020-01-04 ENCOUNTER — Telehealth: Payer: Self-pay | Admitting: *Deleted

## 2020-01-04 NOTE — Telephone Encounter (Signed)
1st attempt. Unable to reach patient. LVM for pt to call office to schedule hospital follow up appointment.   

## 2020-01-05 ENCOUNTER — Emergency Department (HOSPITAL_COMMUNITY)
Admission: EM | Admit: 2020-01-05 | Discharge: 2020-01-05 | Disposition: A | Discharge status: Home / Self Care | Payer: Self-pay | Attending: Emergency Medicine | Admitting: Emergency Medicine

## 2020-01-05 ENCOUNTER — Telehealth (HOSPITAL_COMMUNITY): Payer: Self-pay | Admitting: *Deleted

## 2020-01-05 ENCOUNTER — Emergency Department (HOSPITAL_COMMUNITY): Payer: Self-pay

## 2020-01-05 ENCOUNTER — Other Ambulatory Visit: Payer: Self-pay

## 2020-01-05 ENCOUNTER — Encounter (HOSPITAL_COMMUNITY): Payer: Self-pay

## 2020-01-05 DIAGNOSIS — I480 Paroxysmal atrial fibrillation: Secondary | ICD-10-CM

## 2020-01-05 DIAGNOSIS — Z87891 Personal history of nicotine dependence: Secondary | ICD-10-CM | POA: Insufficient documentation

## 2020-01-05 DIAGNOSIS — I4891 Unspecified atrial fibrillation: Secondary | ICD-10-CM | POA: Insufficient documentation

## 2020-01-05 DIAGNOSIS — Z79899 Other long term (current) drug therapy: Secondary | ICD-10-CM | POA: Insufficient documentation

## 2020-01-05 DIAGNOSIS — I959 Hypotension, unspecified: Secondary | ICD-10-CM | POA: Insufficient documentation

## 2020-01-05 DIAGNOSIS — I471 Supraventricular tachycardia: Secondary | ICD-10-CM

## 2020-01-05 LAB — CBC
HCT: 46.3 % — ABNORMAL HIGH (ref 36.0–46.0)
Hemoglobin: 14.2 g/dL (ref 12.0–15.0)
MCH: 30.5 pg (ref 26.0–34.0)
MCHC: 30.7 g/dL (ref 30.0–36.0)
MCV: 99.4 fL (ref 80.0–100.0)
Platelets: 295 10*3/uL (ref 150–400)
RBC: 4.66 MIL/uL (ref 3.87–5.11)
RDW: 12.7 % (ref 11.5–15.5)
WBC: 10.1 10*3/uL (ref 4.0–10.5)
nRBC: 0 % (ref 0.0–0.2)

## 2020-01-05 LAB — BASIC METABOLIC PANEL WITH GFR
Anion gap: 10 (ref 5–15)
BUN: 14 mg/dL (ref 6–20)
CO2: 20 mmol/L — ABNORMAL LOW (ref 22–32)
Calcium: 8.9 mg/dL (ref 8.9–10.3)
Chloride: 107 mmol/L (ref 98–111)
Creatinine, Ser: 1.27 mg/dL — ABNORMAL HIGH (ref 0.44–1.00)
GFR calc Af Amer: 57 mL/min — ABNORMAL LOW (ref >60)
GFR calc non Af Amer: 49 mL/min — ABNORMAL LOW (ref >60)
Glucose, Bld: 123 mg/dL — ABNORMAL HIGH (ref 70–99)
Potassium: 4.9 mmol/L (ref 3.5–5.1)
Sodium: 137 mmol/L (ref 135–145)

## 2020-01-05 LAB — I-STAT BETA HCG BLOOD, ED (MC, WL, AP ONLY): I-stat hCG, quantitative: <5 m[IU]/mL (ref <5)

## 2020-01-05 MED ORDER — METOPROLOL SUCCINATE ER 50 MG PO TB24: 50.0000 mg | ORAL_TABLET | Freq: Every day | ORAL | 6 refills | Status: DC

## 2020-01-05 MED ORDER — SODIUM CHLORIDE 0.9% FLUSH
3.0000 mL | Freq: Once | INTRAVENOUS | Status: AC
  Administered 2020-01-05: 3 mL via INTRAVENOUS

## 2020-01-05 MED ORDER — SODIUM CHLORIDE 0.9 % IV BOLUS
1000.0000 mL | Freq: Once | INTRAVENOUS | Status: AC
  Administered 2020-01-05: 1000 mL via INTRAVENOUS

## 2020-01-05 MED ORDER — ADENOSINE 6 MG/2ML IV SOLN
INTRAVENOUS | Status: AC
  Filled 2020-01-05: qty 6

## 2020-01-05 MED ORDER — ONDANSETRON HCL 4 MG/2ML IJ SOLN
INTRAMUSCULAR | Status: AC
  Administered 2020-01-05: 4 mg
  Filled 2020-01-05: qty 2

## 2020-01-05 NOTE — ED Notes (Signed)
Paged Cardiology per PA [facility]

## 2020-01-05 NOTE — Telephone Encounter (Signed)
Close encounter 

## 2020-01-05 NOTE — ED Notes (Signed)
On arrival pt placed on monitor, HR 209, EDP Miller notified. Pt placed on pads, shocked x1 at 100J pt converted to 114 ST. Pt now between 95-103

## 2020-01-05 NOTE — Telephone Encounter (Signed)
I have made two attempts and have been unable to reach patient. I have mailed the patient a letter requesting they call the office to schedule a hospital follow up appointment.   

## 2020-01-05 NOTE — ED Triage Notes (Signed)
Per [facility] pt from work, pt felt hot, dizzy and feeling "near syncopal" laid in the floor x2 hours, recently prescribed new prescriptions, blood thinner? And Cardizem, seen here on Thursday.  Experienced "fluttering" sensations through out her life. Pt HR 210 on EMS arrival, wide complex, gave 6mg  Adenosine, 12 mg Adenosine x2, slowed down then pt spontaneously converted. ST 110-130 on arrival to ED.   800 cc NS  4mg  Zofran   18g LAC  BP 94/58 RR 16 SpO2 100% RA

## 2020-01-05 NOTE — ED Provider Notes (Signed)
100Patient is a very ill-appearing 49 year old female presenting with a severe tachyarrhythmia, hypotensive, pale, mottled and in cardiogenic shock.  Blood pressure in the 70s, no palpable radial artery pulses, barely palpable carotid artery pulses, mental status is mildly somnolent but arousable.  On exam this patient has a soft abdomen, no edema, heart rate in the 200s which is narrow complex and irregularly irregular.  She received up to 30 mg in separate doses of adenosine prehospital with no significant return to baseline.  Significant for recent arrhythmia history, evidently had TEE and was started on anticoagulation within the last week or so according to the husband who is at the bedside.  The patient continued to be unstable, she required cardioversion which was successful on the first attempt biphasic at 100 J.  Unfortunately we were not able to use analgesia or sedation due to the patient's severe hypotension.  On repeat exam her blood pressure is 115 and a heart rate of 100 and what appears to be sinus rhythm.  We will need to consult with cardiology, this patient will remain on a monitor, critically ill.  The electrophysiology team will see the patient in consultation for recommendations.  After cardioversion the patient is now very stable appearing with a heart rate in the 90s, a blood pressure in the 115 range and a mental status which is normal.    The patient stabilized significantly after being cardioverted, cardiology will see the patient to make recommendations on being admitted versus being discharged and change in medications.  .Critical Care Performed by: Noemi Chapel, MD Authorized by: Noemi Chapel, MD   Critical care provider statement:    Critical care time (minutes):  35   Critical care time was exclusive of:  Separately billable procedures and treating other patients and teaching time   Critical care was necessary to treat or prevent imminent or life-threatening  deterioration of the following conditions:  Cardiac failure   Critical care was time spent personally by me on the following activities:  Blood draw for specimens, development of treatment plan with patient or surrogate, discussions with consultants, evaluation of patient's response to treatment, examination of patient, obtaining history from patient or surrogate, ordering and performing treatments and interventions, ordering and review of laboratory studies, ordering and review of radiographic studies, pulse oximetry, re-evaluation of patient's condition and review of old charts Comments:       .Cardioversion  Date/Time: 01/05/2020 2:11 PM Performed by: Noemi Chapel, MD Authorized by: Noemi Chapel, MD   Consent:    Consent obtained:  Verbal   Consent given by:  Patient and spouse   Risks discussed:  Induced arrhythmia and pain   Alternatives discussed:  Alternative treatment Pre-procedure details:    Cardioversion basis:  Emergent   Rhythm:  Atrial fibrillation   Electrode placement:  Anterior-posterior Patient sedated: No Attempt one:    Cardioversion mode:  Synchronous   Waveform:  Biphasic   Shock (Joules):  100   Shock outcome:  Conversion to normal sinus rhythm Post-procedure details:    Patient status:  Awake   Patient tolerance of procedure:  Tolerated well, no immediate complications Comments:            EKG Interpretation  Date/Time:  Tuesday January 05 2020 13:49:11 EDT Ventricular Rate:  169 PR Interval:    QRS Duration: 111 QT Interval:  317 QTC Calculation: 530 R Axis:   -168 Text Interpretation: afib with RVR Low voltage, extremity leads Probable right ventricular hypertrophy Prolonged QT interval  Baseline wander in lead(s) V5 Since last tracing afib has recurred Confirmed by Noemi Chapel [phone]) on 01/05/2020 2:09:21 PM       EKG Interpretation  Date/Time:  Tuesday January 05 2020 14:04:39 EDT Ventricular Rate:  129 PR Interval:    QRS  Duration: 114 QT Interval:  360 QTC Calculation: 469 R Axis:   179 Text Interpretation: Sinus tachycardia Multiform ventricular premature complexes IRBBB and LPFB Low voltage, extremity leads ST elevation suggests acute pericarditis Baseline wander in lead(s) II III aVF Since last tracing rate slower now in sinus rhythm with ectopy Confirmed by Noemi Chapel [phone]) on 01/05/2020 2:57:43 PM      Medical screening examination/treatment/procedure(s) were conducted as a shared visit with non-physician practitioner(s) and myself.  I personally evaluated the patient during the encounter.  Clinical Impression:   Final diagnoses:  Atrial fibrillation with RVR ([place])  Hypotension, unspecified hypotension type           Noemi Chapel, MD 01/06/20 [phone]

## 2020-01-05 NOTE — ED Provider Notes (Signed)
[name] Provider Note   CSN: [id number] Arrival date & time: 01/05/20  1339     History Chief Complaint  Patient presents with  . Tachycardia    Cindy Christian is a 49 y.o. female.  HPI  Patient is a 49 year old female with a history of atrial fibrillation diagnosed approximately 1 week ago when she was admitted to the hospital.  She has been taking her flecainide and Toprol and Eliquis as prescribed since that time.  She denies any alcohol use.   Patient states that she had acute onset of shortness of breath and palpitations approximately 1030 this morning.  She states that it worsened since that time and when she called EMS she was told that she was in A. fib.  She was brought to the ED.  Patient states that she has had intermittent episodes of palpitations since she was first diagnosed with A. fib.   On my review of EMR patient was admitted to hospital for A. fib RVR.  Was seen by cardiology and EP.  Diagnosed with new onset A. fib.  Had 2 attempts of DC cardioversion with only brief moments of sinus rhythm.  However seems to have cardioverted during hospitalization with flecainide initiation.  Have a TEE done before cardioversion attempt in hospital.  Was negative for any left atrial appendage clot.  Was found to have a EF of 50-55% at that time.  Was recommended follow-up with outpatient cardiology and EP.    Past Medical History:  Diagnosis Date  . Borderline glaucoma    popa    Patient Active Problem List   Diagnosis Date Noted  . A-fib ([place]) 12/27/2019  . Atrial fibrillation with RVR ([place]) 12/26/2019    Past Surgical History:  Procedure Laterality Date  . BUBBLE STUDY  12/30/2019   Procedure: BUBBLE STUDY;  Surgeon: Elouise Munroe, MD;  Location: [facility];  Service: Cardiovascular;;  . CARDIOVERSION N/A 12/30/2019   Procedure: CARDIOVERSION;  Surgeon: Elouise Munroe, MD;  Location: [facility] ENDOSCOPY;  Service:  Cardiovascular;  Laterality: N/A;  . FINGER FRACTURE SURGERY Left 1983   middle finger  . KNEE ARTHROPLASTY Bilateral 2000 R 2008 L  . LEEP  2004   CIN 1-2  . TEE WITHOUT CARDIOVERSION N/A 12/30/2019   Procedure: TRANSESOPHAGEAL ECHOCARDIOGRAM (TEE);  Surgeon: Elouise Munroe, MD;  Location: [facility] ENDOSCOPY;  Service: Cardiovascular;  Laterality: N/A;     OB History   No obstetric history on file.     Family History  Problem Relation Age of Onset  . Diabetes Maternal Uncle        Type 1  . Stroke Maternal Grandmother     Social History   Tobacco Use  . Smoking status: Former Smoker    Packs/day: 0.50    Years: 13.00    Pack years: 6.50    Quit date: 04/24/1998    Years since quitting: 21.7  . Smokeless tobacco: Never Used  Substance Use Topics  . Alcohol use: Yes    Alcohol/week: 16.0 standard drinks    Types: 4 Glasses of wine, 12 Cans of beer per week  . Drug use: No    Home Medications Prior to Admission medications   Medication Sig Start Date End Date Taking? Authorizing Provider  apixaban (ELIQUIS) 5 MG TABS tablet Take 1 tablet (5 mg total) by mouth 2 (two) times daily. 12/31/19   Amin, Jeanella Flattery, MD  Ascorbic Acid (VITAMIN C) 500 MG CAPS Take 100  mg by mouth daily.     [provider]  b complex vitamins tablet Take 1 tablet by mouth daily.    [provider]  Calcium Carb-Cholecalciferol (CALCIUM + D3 PO) Take 1 tablet by mouth daily.     [provider]  flecainide (TAMBOCOR) 100 MG tablet Take 1 tablet (100 mg total) by mouth every 12 (twelve) hours. 12/31/19   Amin, Jeanella Flattery, MD  metoprolol succinate (TOPROL-XL) 50 MG 24 hr tablet Take 1 tablet (50 mg total) by mouth daily. 01/05/20   Shirley Friar, PA-C  OVER THE COUNTER MEDICATION Place 1 drop into both eyes daily. Simalasin Allergy Eye Relief - homeopathic    [provider]  Zinc 50 MG CAPS Take 50 mg by mouth daily.    [provider]     Allergies    Penicillins  Review of Systems   Review of Systems  Constitutional: Negative for chills and fever.  HENT: Negative for congestion.   Respiratory: Positive for shortness of breath. Negative for cough.   Cardiovascular: Positive for palpitations. Negative for chest pain and leg swelling.  Gastrointestinal: Negative for abdominal pain and vomiting.  Genitourinary: Negative for dysuria.  Musculoskeletal: Negative for myalgias.  Skin: Negative for rash.  Neurological: Negative for dizziness and headaches.  Psychiatric/Behavioral: The patient is nervous/anxious.     Physical Exam Updated Vital Signs BP (!) 132/93   Pulse 92   Resp 18   Ht 5\' 4"  (1.626 m)   Wt 84.8 kg   LMP 12/06/2019   SpO2 98%   BMI 32.10 kg/m   Physical Exam Vitals and nursing note reviewed.  Constitutional:      General: She is not in acute distress. HENT:     Head: Normocephalic and atraumatic.     Nose: Nose normal.     Mouth/Throat:     Mouth: Mucous membranes are dry.  Eyes:     General: No scleral icterus. Cardiovascular:     Rate and Rhythm: Tachycardia present. Rhythm irregular.     Pulses: Normal pulses.     Heart sounds: Normal heart sounds.     Comments: Heart rate 200  Bilateral radial and DP/PT pulses unable to be palpated.  Carotid pulse is faint. Pulmonary:     Effort: Pulmonary effort is normal. No respiratory distress.     Breath sounds: No wheezing.  Abdominal:     Palpations: Abdomen is soft.     Tenderness: There is no abdominal tenderness.  Musculoskeletal:     Cervical back: Normal range of motion.     Right lower leg: No edema.     Left lower leg: No edema.  Skin:    General: Skin is dry.     Capillary Refill: Capillary refill takes less than 2 seconds.     Comments: Bilateral feet and hands are cold to touch.  Neurological:     Mental Status: She is alert. Mental status is at baseline.     Comments: Patient is mentating well.  Alert and oriented  x3. Moves all 4 extremities.  Psychiatric:     Comments: Anxious.     ED Results / Procedures / Treatments   Labs (all labs ordered are listed, but only abnormal results are displayed) Labs Reviewed  BASIC METABOLIC PANEL - Abnormal; Notable for the following components:      Result Value   CO2 20 (*)    Glucose, Bld 123 (*)    Creatinine, Ser 1.27 (*)  GFR calc non Af Amer 49 (*)    GFR calc Af Amer 57 (*)    All other components within normal limits  CBC - Abnormal; Notable for the following components:   HCT 46.3 (*)    All other components within normal limits  I-STAT BETA HCG BLOOD, ED ([facility], [facility], [facility] ONLY)    EKG EKG Interpretation  Date/Time:  Tuesday January 05 2020 15:05:44 EDT Ventricular Rate:  91 PR Interval:    QRS Duration: 117 QT Interval:  440 QTC Calculation: 542 R Axis:   171 Text Interpretation: Sinus rhythm IRBBB and LPFB Low voltage, extremity and precordial leads Probable anteroseptal infarct, old Nonspecific T abnormalities, lateral leads Baseline wander in lead(s) V3 compared wtih prior tracings - no sig changes Confirmed by Noemi Chapel [phone]) on 01/05/2020 3:11:04 PM   Radiology DG Chest Portable 1 View  Result Date: 01/05/2020 CLINICAL DATA:  Near syncopal event today.  Atrial fibrillation. EXAM: PORTABLE CHEST 1 VIEW COMPARISON:  12/26/2019 FINDINGS: The heart size and mediastinal contours are within normal limits. Both lungs are clear. The visualized skeletal structures are unremarkable. IMPRESSION: No active disease. Electronically Signed   By: Nelson Chimes M.D.   On: 01/05/2020 14:00    Procedures .Critical Care Performed by: Tedd Sias, PA Authorized by: Tedd Sias, PA   Critical care provider statement:    Critical care time (minutes):  35   Critical care time was exclusive of:  Separately billable procedures and treating other patients and teaching time   Critical care was necessary to treat or prevent imminent or  life-threatening deterioration of the following conditions:  Shock and circulatory failure   Critical care was time spent personally by me on the following activities:  Discussions with consultants, evaluation of patient's response to treatment, examination of patient, review of old charts, re-evaluation of patient's condition, pulse oximetry, ordering and review of radiographic studies, ordering and review of laboratory studies and ordering and performing treatments and interventions   I assumed direction of critical care for this patient from another provider in my specialty: no     (including critical care time)  Medications Ordered in ED Medications  sodium chloride flush (NS) 0.9 % injection 3 mL (3 mLs Intravenous Given 01/05/20 1415)  ondansetron (ZOFRAN) 4 MG/2ML injection (4 mg  Given 01/05/20 1414)  sodium chloride 0.9 % bolus 1,000 mL (0 mLs Intravenous Stopped 01/05/20 1518)    ED Course  I have reviewed the triage vital signs and the nursing notes.  Pertinent labs & imaging results that were available during my care of the patient were reviewed by me and considered in my medical decision making (see chart for details).  Patient is 49 year old female with a recently diagnosed history of atrial fibrillation who was recently discharged from hospital on Thursday.  Was discharged with flecainide and toprolol.  She has been taking medications as instructed.  She had negative TEE done and 2 attempts of DC cardioversion during hospital stay.  These were unsuccessful however she cardioverted after flecainide.  Patient initially hypotensive, diaphoretic, short of breath, tachycardic at 200.  Discussed case with Dr. Sabra Heck medicine physician.  Patient had recent negative TEE and is currently unstable she was cardioverted at 100 J.  Patient was not procedurally sedated for this procedure as she was unstable and there was a risk for hypotension.  Single biphasic 100 J shock was delivered and was  successful with cardioversion.  Repeat EKG showed sinus tachycardia.  Patient's  blood pressure normalized to 109/84 after cardioversion.  She is mentating well.  Cardiology consulted awaiting their recommendations.  Lab work discussed below.  Reviewed myself shows no acute evidence of abnormalities in the thorax.  There is no broken bones, infiltrates or pulmonary edema.  Cardiopulmonary silhouette is unchanged.  Clinical Course as of Jan 04 1609  Tue Jan 05, 2020  1508 HCG neg for preg   [WF]  1508 CBC unremarkable. CMP with mildly low CO2 likely secondary to hyperventilation from her heart palpitations.  Pain mildly elevated from baseline at 1.27 only 1 prior creatinine to compare to which was 0.6.   [WF]  1509 No acute cardiopulmonary abnormalities.  DG Chest Portable 1 View [WF]    Clinical Course User Index [WF] Tedd Sias, PA    Mild bump in creatinine may be secondary to hypoperfusion from tachycardia.  I personally reviewed patient's prior lab work and she has had a recent TSH/free T4 and T3 ordered and reviewed.  TSH was mildly elevated however T4 and T3 were within normal limits making hypo-/hyperthyroidism unlikely.  After multiple reassessments patient continues to seem stable this time.  Electrophysiology PA has evaluated patient at bedside.  We will continue to monitor and wait for recommendations.  I discussed this case with my attending physician who cosigned this note including patient's presenting symptoms, physical exam, and planned diagnostics and interventions. Attending physician stated agreement with plan or made changes to plan which were implemented.   Attending physician assessed patient at bedside.   On my reassessment before discharge patient has palpable pulses in all 4 extremities.  3+ pulses.  That are symmetric pulses as well.  She is mentating well and feels much better.  Has continued to have pulses in the 90-100 range.  Blood pressure has been  stable at 130/90.  Oxygen saturation 98% on room air.   MDM Rules/Calculators/A&P     CHA2DS2-VASc Score: 2                 Discussed care with electrophysiology PA.  EP PA and MD evaluated patient at bedside.  They recommended discharge with increased dose of beta-blocker.  Patient has close follow-up on Thursday with electrophysiology.  She was given strict return precautions.  She is comfortable with discharge at this time.  She will take her medications as recommended by electrophysiology and cardiology team.  The medical records were personally reviewed by myself. I personally reviewed all lab results and interpreted all imaging studies and either concurred with their official read or contacted radiology for clarification.   This patient appears reasonably screened and I doubt any other medical condition requiring further workup, evaluation, or treatment in the ED at this time prior to discharge.   Patient's vitals are WNL apart from vital sign abnormalities discussed above, patient is in NAD, and able to ambulate in the ED at their baseline and able to tolerate PO.  Pain has been managed or a plan has been made for home management and has no complaints prior to discharge. Patient is comfortable with above plan and for discharge at this time. All questions were answered prior to disposition. Results from the ER workup discussed with the patient face to face and all questions answered to the best of my ability. The patient is safe for discharge with strict return precautions. Patient appears safe for discharge with appropriate follow-up. Conveyed my impression with the patient and they voiced understanding and are agreeable to plan.   An  After Visit Summary was printed and given to the patient.  Portions of this note were generated with [employer]. Dictation errors may occur despite best attempts at proofreading.    Final Clinical Impression(s) / ED Diagnoses Final  diagnoses:  Atrial fibrillation with RVR ([facility])  Hypotension, unspecified hypotension type    Rx / DC Orders ED Discharge Orders         Ordered    metoprolol succinate (TOPROL-XL) 50 MG 24 hr tablet  Daily     01/05/20 1551           Pati Gallo [place], [place] 01/05/20 1610    Noemi Chapel, MD 01/06/20 [phone]

## 2020-01-05 NOTE — Consult Note (Addendum)
ELECTROPHYSIOLOGY H&P NOTE    Patient ID: Cindy Christian MRN: [id number], DOB/AGE: 02/21/1971 49 y.o.  Admit date: 01/05/2020 Date of Consult: 01/05/2020  Primary Physician: Carollee Herter, Alferd Apa, DO Primary Cardiologist: No primary care provider on file.  Electrophysiologist: Dr. Curt Bears   Referring Provider: Dr. Sabra Heck  Reason for consult: AF with RVR  Patient Profile: Cindy Christian is a 49 y.o. female with a history of recently diagnosed AF and diastolic CHF who is being seen today for the evaluation of rapid AF at the request of Dr. Sabra Heck.  HPI:  Cindy Christian is a 49 y.o. female with medical history above. She was recently admitted for new AF with RVR. Echo showed normal EF, elevated BNP. CTA negative for PE. Started on cardizem drip -> unsuccessful TEE/DCCV. EP consulted and pt started on flecainide with conversion to NSR.  She was then cleared for discharge on Flecainide 100 mg BID, Toprol 25 mg BID and Eliquis with CHA2DS2VASC of at least 2    Today pt was at work when she started to feel, hot, dizzy, and like she was going to pass out. She laid on the floor for approx 2 hours. EMS was called. HR 210 on arrival, wide complex. She was given 6 mg adenosine and then 12 mg adenosine x 2.  This slowed her rate, and then converted her into ? ST in 110-130 bpm.   EP asked to see for possible admission and management of AF.   Pt urgently cardioverted due to hypotension in the 70s with poor pulses.   Currently she is awake and feeling much better.  She states she felt better when she left the hospital. Friday had mild palpitations but nothing persistent. Sat/Sun she felt "great" and took it easy at home. Monday she did not feel well and stayed home. Today she went to work and felt bad as soon as she got there. She has had a small amount of caffeine (half cup of coffee this am) but has not had any alcohol.  She denies chest pain or SOB. Only the palpitations and fatigue. Her weight is down  altogether about 15 lbs from prior to her first admission, and she says overall her breathing feels much better. 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   Echo 12/28/2019 LVEF 55-60%, Slightly enlarged RV, LA diam 4.0 cm. TEE 12/30/2019 LVEF 50%, LA "mildly dilated"  Past Medical History:  Diagnosis Date  . Borderline glaucoma    popa     Surgical History:  Past Surgical History:  Procedure Laterality Date  . BUBBLE STUDY  12/30/2019   Procedure: BUBBLE STUDY;  Surgeon: Elouise Munroe, MD;  Location: [place];  Service: Cardiovascular;;  . CARDIOVERSION N/A 12/30/2019   Procedure: CARDIOVERSION;  Surgeon: Elouise Munroe, MD;  Location: [facility] ENDOSCOPY;  Service: Cardiovascular;  Laterality: N/A;  . FINGER FRACTURE SURGERY Left 1983   middle finger  . KNEE ARTHROPLASTY Bilateral 2000 R 2008 L  . LEEP  2004   CIN 1-2  . TEE WITHOUT CARDIOVERSION N/A 12/30/2019   Procedure: TRANSESOPHAGEAL ECHOCARDIOGRAM (TEE);  Surgeon: Elouise Munroe, MD;  Location: [facility] ENDOSCOPY;  Service: Cardiovascular;  Laterality: N/A;     (Not in a hospital admission)   Inpatient Medications:  . adenosine        Allergies:  Allergies  Allergen Reactions  . Penicillins Other (See Comments)    Childhood allergy Did it involve swelling  of the face/tongue/throat, SOB, or low BP? Unknown Did it involve sudden or severe rash/hives, skin peeling, or any reaction on the inside of your mouth or nose? Unknown Did you need to seek medical attention at a hospital or doctor's office? Unknown When did it last happen?young child If all above answers are "NO", may proceed with cephalosporin use.    Social History   Socioeconomic History  . Marital status: Single    Spouse name: Not on file  . Number of children: Not on file  . Years of education: Not on file  . Highest education level: Not on file  Occupational History  .  Occupation: admin    Comment: Psychologist, counselling  Tobacco Use  . Smoking status: Former Smoker    Packs/day: 0.50    Years: 13.00    Pack years: 6.50    Quit date: 04/24/1998    Years since quitting: 21.7  . Smokeless tobacco: Never Used  Substance and Sexual Activity  . Alcohol use: Yes    Alcohol/week: 16.0 standard drinks    Types: 4 Glasses of wine, 12 Cans of beer per week  . Drug use: No  . Sexual activity: Yes    Partners: Male  Other Topics Concern  . Not on file  Social History Narrative   Walking daily   Social Determinants of Health   Financial Resource Strain:   . Difficulty of Paying Living Expenses:   Food Insecurity:   . Worried About Charity fundraiser in the Last Year:   . Arboriculturist in the Last Year:   Transportation Needs:   . Film/video editor (Medical):   Marland Kitchen Lack of Transportation (Non-Medical):   Physical Activity:   . Days of Exercise per Week:   . Minutes of Exercise per Session:   Stress:   . Feeling of Stress :   Social Connections:   . Frequency of Communication with Friends and Family:   . Frequency of Social Gatherings with Friends and Family:   . Attends Religious Services:   . Active Member of Clubs or Organizations:   . Attends Archivist Meetings:   Marland Kitchen Marital Status:   Intimate Partner Violence:   . Fear of Current or Ex-Partner:   . Emotionally Abused:   Marland Kitchen Physically Abused:   . Sexually Abused:      Family History  Problem Relation Age of Onset  . Diabetes Maternal Uncle        Type 1  . Stroke Maternal Grandmother      Review of Systems: All other systems reviewed and are otherwise negative except as noted above.  Physical Exam: Vitals:   01/05/20 1346  Weight: 84.8 kg  Height: 5\' 4"  (1.626 m)    GEN- The patient is well appearing, alert and oriented x 3 today.   HEENT: normocephalic, atraumatic; sclera clear, conjunctiva pink; hearing intact; oropharynx clear; neck supple Lungs- Clear to  ausculation bilaterally, normal work of breathing.  No wheezes, rales, rhonchi Heart- Regular rate and rhythm, no murmurs, rubs or gallops GI- soft, non-tender, non-distended, bowel sounds present Extremities- no clubbing, cyanosis, or edema; DP/PT/radial pulses 2+ bilaterally MS- no significant deformity or atrophy Skin- warm and dry, no rash or lesion Psych- euthymic mood, full affect Neuro- strength and sensation are intact  Labs:   Lab Results  Component Value Date   WBC 5.4 12/31/2019   HGB 11.9 (L) 12/31/2019   HCT 37.5 12/31/2019   MCV 98.7  12/31/2019   PLT 268 12/31/2019    Recent Labs  Lab 12/31/19 0228  NA 140  K 4.0  CL 110  CO2 21*  BUN 6  CREATININE 0.60  CALCIUM 8.8*  GLUCOSE 98      Radiology/Studies: CT Angio Chest PE W/Cm &/Or Wo Cm  Result Date: 12/27/2019 CLINICAL DATA:  PE suspected, low/intermediate prob, positive D-dimer Shortness of breath for 5 weeks, worse with activity and lying flat. EXAM: CT ANGIOGRAPHY CHEST WITH CONTRAST TECHNIQUE: Multidetector CT imaging of the chest was performed using the standard protocol during bolus administration of intravenous contrast. Multiplanar CT image reconstructions and MIPs were obtained to evaluate the vascular anatomy. CONTRAST:  5mL OMNIPAQUE IOHEXOL 350 MG/ML SOLN COMPARISON:  Lung bases from abdominal CT earlier today. Chest radiograph earlier today. FINDINGS: Cardiovascular: There are no filling defects within the pulmonary arteries to suggest pulmonary embolus. Upper normal heart size. Thoracic aorta is normal in caliber. Mild aortic atherosclerosis. No evidence of aortic dissection. No pericardial effusion. Mediastinum/Nodes: Small mediastinal and hilar lymph nodes are not enlarged by size criteria. No esophageal wall thickening. No visualized pulmonary nodule. Lungs/Pleura: Small to moderate bilateral pleural effusions. Adjacent atelectasis in both lower lobes. Basilar septal thickening. Peribronchial  thickening which may be congestive. Scattered nodular ground-glass opacities in the mid lower lung zones, series 5 images 53, 55, and image 70. Upper Abdomen: Assessed on abdominal CT earlier today. No interval change. Musculoskeletal: There are no acute or suspicious osseous abnormalities. Review of the MIP images confirms the above findings. IMPRESSION: 1. No pulmonary embolus. 2. Small to moderate bilateral pleural effusions, adjacent atelectasis. Basilar septal thickening suspicious for pulmonary edema. Bronchial thickening, suspect this is congestive. 3. Nodular ground-glass opacities in the bilateral mid lower lung zones, nonspecific for pulmonary edema versus infectious or inflammatory etiology. COVID pneumonia can produce a similar appearance, though this is slightly atypical. Aortic Atherosclerosis (ICD10-I70.0). Electronically Signed   By: Keith Rake M.D.   On: 12/27/2019 00:11   CT Abdomen Pelvis W Contrast  Result Date: 12/26/2019 CLINICAL DATA:  Shortness of breath and abdominal distension EXAM: CT ABDOMEN AND PELVIS WITH CONTRAST TECHNIQUE: Multidetector CT imaging of the abdomen and pelvis was performed using the standard protocol following bolus administration of intravenous contrast. CONTRAST:  164mL OMNIPAQUE IOHEXOL 300 MG/ML  SOLN COMPARISON:  None. FINDINGS: Lower chest: The visualized heart size within normal limits. No pericardial fluid/thickening. There is small bilateral pleural effusions, right greater than left. Patchy airspace opacities are seen at both lung bases, right greater than left. Hepatobiliary: The liver is normal in density without focal abnormality.The main portal vein is patent. There appears to be a small amount of pericholecystic fluid present. No definite layering cough calcified gallstones are seen. There is question of mild fat stranding changes seen. No intrahepatic biliary ductal dilatation is seen. Pancreas: Unremarkable. No pancreatic ductal dilatation or  surrounding inflammatory changes. Spleen: Normal in size without focal abnormality. Adrenals/Urinary Tract: Both adrenal glands appear normal. The kidneys and collecting system appear normal without evidence of urinary tract calculus or hydronephrosis. Bladder is unremarkable. Stomach/Bowel: The stomach, small bowel, and colon are normal in appearance. No inflammatory changes, wall thickening, or obstructive findings. Vascular/Lymphatic: There are no enlarged mesenteric, retroperitoneal, or pelvic lymph nodes. No significant vascular findings are present. Reproductive: The uterus and adnexa are unremarkable. A small amount of physiologic free fluid in the cul-de-sac. Other: Small amount of inflammatory changes seen tracking into the right lower quadrant. Musculoskeletal: No acute or significant  osseous findings. IMPRESSION: 1. Small bilateral pleural effusions, right greater than left. 2. Patchy airspace opacities at both lung bases which could be due to atelectasis or infectious etiology. 3. Small amount of pericholecystic fluid and question of mild fat stranding changes around the gallbladder. However no definite calcified gallstones are seen. If further evaluation is required would recommend right upper quadrant ultrasound. Electronically Signed   By: Prudencio Pair M.D.   On: 12/26/2019 22:40   DG Chest Portable 1 View  Result Date: 01/05/2020 CLINICAL DATA:  Near syncopal event today.  Atrial fibrillation. EXAM: PORTABLE CHEST 1 VIEW COMPARISON:  12/26/2019 FINDINGS: The heart size and mediastinal contours are within normal limits. Both lungs are clear. The visualized skeletal structures are unremarkable. IMPRESSION: No active disease. Electronically Signed   By: Nelson Chimes M.D.   On: 01/05/2020 14:00   DG Chest Port 1 View  Result Date: 12/26/2019 CLINICAL DATA:  Shortness of breath EXAM: PORTABLE CHEST 1 VIEW COMPARISON:  May 07, 2007 FINDINGS: The heart size and mediastinal contours are within  normal limits. There is hazy airspace opacity seen at the right lung base. No pleural effusion is seen. No acute osseous abnormality. IMPRESSION: Hazy airspace opacity at the right lung base which could be due to atelectasis or infectious etiology. Electronically Signed   By: Prudencio Pair M.D.   On: 12/26/2019 20:42   ECHOCARDIOGRAM COMPLETE  Result Date: 12/28/2019    ECHOCARDIOGRAM REPORT   Patient Name:   Cindy Christian Date of Exam: 12/28/2019 Medical Rec #:  [id number]   Height:       64.0 in Accession #:    [id number]  Weight:       194.6 lb Date of Birth:  21-Sep-1971   BSA:          1.934 m Patient Age:    89 years    BP:           107/74 mmHg Patient Gender: F           HR:           97 bpm. Exam Location:  Inpatient Procedure: 2D Echo Indications:    atrial fibrillation  History:        Patient has no prior history of Echocardiogram examinations.                 Risk Factors:Former Smoker.  Sonographer:    Jannett Celestine RDCS (AE) Referring Phys: [id number] Lequita Halt IMPRESSIONS  1. Technically difficult study due to atrial fibrillation and poor windows. Grossly normal LV systolic function. Left ventricular ejection fraction, by estimation, is 55 to 60%. The left ventricle has normal function. The left ventricle has no regional wall motion abnormalities. There is mild left ventricular hypertrophy. Left ventricular diastolic parameters are indeterminate.  2. Right ventricular systolic function is normal. The right ventricular size is mildly enlarged.  3. Right atrial size was mildly dilated.  4. The mitral valve is normal in structure. Mild mitral valve regurgitation.  5. The aortic valve was not well visualized. Aortic valve regurgitation is not visualized. AV gradients were not measured FINDINGS  Left Ventricle: Left ventricular ejection fraction, by estimation, is 55 to 60%. The left ventricle has normal function. The left ventricle has no regional wall motion abnormalities. The left ventricular internal  cavity size was normal in size. There is  mild left ventricular hypertrophy. Left ventricular diastolic parameters are indeterminate. Right Ventricle: The right ventricular size is mildly enlarged. Right vetricular  wall thickness was not assessed. Right ventricular systolic function is normal. Left Atrium: Left atrial size was normal in size. Right Atrium: Right atrial size was mildly dilated. Pericardium: There is no evidence of pericardial effusion. Mitral Valve: The mitral valve is normal in structure. Mild mitral valve regurgitation. Tricuspid Valve: The tricuspid valve is normal in structure. Tricuspid valve regurgitation is trivial. Aortic Valve: The aortic valve was not well visualized. Aortic valve regurgitation is not visualized. Pulmonic Valve: The pulmonic valve was not well visualized. Pulmonic valve regurgitation is trivial. Aorta: The aortic root is normal in size and structure. IAS/Shunts: The interatrial septum was not well visualized.  LEFT VENTRICLE PLAX 2D LVIDd:         4.60 cm LVIDs:         3.00 cm LV PW:         1.00 cm LV IVS:        0.80 cm LVOT diam:     1.90 cm LV SV:         47 LV SV Index:   24 LVOT Area:     2.84 cm  RIGHT VENTRICLE RV S prime:     13.90 cm/s TAPSE (M-mode): 1.7 cm LEFT ATRIUM           Index       RIGHT ATRIUM           Index LA diam:      4.00 cm 2.07 cm/m  RA Area:     21.50 cm LA Vol (A4C): 63.8 ml 32.99 ml/m RA Volume:   63.10 ml  32.63 ml/m  AORTIC VALVE LVOT Vmax:   95.30 cm/s LVOT Vmean:  68.500 cm/s LVOT VTI:    0.167 m  AORTA Ao Root diam: 2.60 cm MITRAL VALVE               TRICUSPID VALVE MV Area (PHT): 2.96 cm    TR Peak grad:   25.2 mmHg MV Decel Time: 256 msec    TR Vmax:        251.00 cm/s MV E velocity: 90.00 cm/s                            SHUNTS                            Systemic VTI:  0.17 m                            Systemic Diam: 1.90 cm Oswaldo Milian MD Electronically signed by Oswaldo Milian MD Signature Date/Time:  12/28/2019/11:03:43 AM    Final    ECHO TEE  Result Date: 12/31/2019    TRANSESOPHOGEAL ECHO REPORT   Patient Name:   Cindy Christian Date of Exam: 12/30/2019 Medical Rec #:  [id number]   Height:       64.0 in Accession #:    [id number]  Weight:       194.0 lb Date of Birth:  1971/03/04   BSA:          1.931 m Patient Age:    49 years    BP:           106/85 mmHg Patient Gender: F           HR:           100 bpm. Exam Location:  Inpatient Procedure: Transesophageal Echo Indications:    atrial fibrillation. cardioversion  History:        Patient has prior history of Echocardiogram examinations, most                 recent 12/28/2019. Risk Factors:Former Smoker.  Sonographer:    Jannett Celestine RDCS (AE) Referring Phys: [id number] [place]: After discussion of the risks and benefits of a TEE, an informed consent was obtained from the patient. TEE procedure time was 28 minutes. The transesophogeal probe was passed without difficulty through the esophogus of the patient. Imaged were obtained with the patient in a left lateral decubitus position. Local oropharyngeal anesthetic was provided with Cetacaine. Sedation performed by different physician. Image quality was good. The patient's vital signs; including heart rate, blood pressure, and oxygen saturation; remained stable throughout the procedure. The patient developed no complications during the procedure. A successful direct current cardioversion was performed at 120, 200 J joules with 2 attempts. IMPRESSIONS  1. Left ventricular ejection fraction, by estimation, is 50%. The left ventricle has low normal with beat to beat variability in function. Left ventricular endocardial border not optimally defined to evaluate regional wall motion.  2. Right ventricular systolic function is normal. The right ventricular size is normal.  3. Left atrial size was mildly dilated. No left atrial/left atrial appendage thrombus was detected.  4. Right atrial size was mildly  dilated.  5. The mitral valve is grossly normal. Mild to moderate mitral valve regurgitation.  6. Tricuspid valve regurgitation is mild to moderate.  7. The aortic valve is tricuspid. Aortic valve regurgitation is trivial. No aortic stenosis is present.  8. Evidence of atrial level shunting detected by color flow Doppler. Agitated saline contrast bubble study was positive with shunting observed within 3-6 cardiac cycles suggestive of interatrial shunt. There is a small patent foramen ovale with bidirectional shunting across atrial septum. FINDINGS  Left Ventricle: Left ventricular ejection fraction, by estimation, is 50%. The left ventricle has low normal with beat to beat variability in function. Left ventricular endocardial border not optimally defined to evaluate regional wall motion. The left ventricular internal cavity size was normal in size. Right Ventricle: The right ventricular size is normal. Right ventricular systolic function is normal. Left Atrium: Left atrial size was mildly dilated. No left atrial/left atrial appendage thrombus was detected. Right Atrium: Right atrial size was mildly dilated. Pericardium: There is no evidence of pericardial effusion. Mitral Valve: The mitral valve is grossly normal. Mild to moderate mitral valve regurgitation. Tricuspid Valve: The tricuspid valve is normal in structure. Tricuspid valve regurgitation is mild to moderate. Aortic Valve: The aortic valve is tricuspid. Aortic valve regurgitation is trivial. No aortic stenosis is present. Pulmonic Valve: The pulmonic valve was normal in structure. Pulmonic valve regurgitation is trivial. Aorta: The aortic root, ascending aorta, aortic arch and descending aorta are all structurally normal, with no evidence of dilitation or obstruction. IAS/Shunts: There is redundancy of the interatrial septum. Evidence of atrial level shunting detected by color flow Doppler. Agitated saline contrast was given intravenously to evaluate for  intracardiac shunting. Agitated saline contrast bubble study was  positive with shunting observed within 3-6 cardiac cycles suggestive of interatrial shunt. A small patent foramen ovale is detected with bidirectional shunting across atrial septum.  AORTIC VALVE LVOT Vmax:   97.20 cm/s LVOT Vmean:  68.600 cm/s LVOT VTI:    0.187 m  AORTA Ao Asc diam: 3.00 cm  SHUNTS Systemic VTI: 0.19 m  Cherlynn Kaiser MD Electronically signed by Cherlynn Kaiser MD Signature Date/Time: 12/31/2019/5:26:35 AM    Final    US Abdomen Limited RUQ  Result Date: 12/28/2019 CLINICAL DATA:  Elevated liver enzymes EXAM: ULTRASOUND ABDOMEN LIMITED RIGHT UPPER QUADRANT COMPARISON:  None. FINDINGS: Gallbladder: No gallstones or wall thickening visualized. There is no pericholecystic fluid. No sonographic Murphy sign noted by sonographer. Common bile duct: Diameter: 3 mm. No intrahepatic or extrahepatic biliary duct dilatation. Liver: No focal lesion identified. Within normal limits in parenchymal echogenicity. Portal vein is patent on color Doppler imaging with normal direction of blood flow towards the liver. Other: There is a right pleural effusion. IMPRESSION: Right pleural effusion.  Study otherwise unremarkable. Electronically Signed   By: Lowella Grip III M.D.   On: 12/28/2019 07:59    EKG:AF with RVR at 169 (13:49:11) and repeat at 1404 shows ? Afib at 129 bpm (personally reviewed)  TELEMETRY: Currently in NSR in 90s after urgent cardioversion; prior to that had SVT/AF RVR with rates as high as 211 bpm (personally reviewed)  Assessment/Plan: 1.  AF with RVR She has been on Eliquis since 12/28/2019 and had normal TEE 12/30/2019 On flecainide 100 mg BID and toprol 25 mg as outpatient.  Will increase Toprol to 50 mg daily. Continue flecainide 100 mg BID She has limited caffeine. Encouraged to continue.  2. Diastolic CHF Her volume status is stable today on exam without diuretics. Her volume overaload last admission was  likely in setting of rapid AF  3. ETOH abuse She remains abstinent since she has left the hospital. Encouraged to continue.   She is now in NSR after urgent DCCV. Will increase meds as above and keep close follow up. OK for discharge today with very close follow up (She has Exercise tolerance test this Thursday, and AF clinic follow up next week). Will discuss timing of possible ablation with Dr. Curt Bears.   Discussed personally with ED PA over her care.   For questions or updates, please contact [place] Please consult [email or web address] for contact info under Cardiology/STEMI.  Signed, Shirley Friar, PA-C  01/05/2020 2:15 PM  I have seen, examined the patient, and reviewed the above assessment and plan.  Changes to above are made where necessary.  On exam, RRR. The patient presents with recurrent SVT/ atrial fibrillation.  Now in sinus after urgent cardioversion.  I would advise that we proceed with ablation at the next available time with Dr Curt Bears.  Increase toprol in the interim.  Ok to discharge to home with close EP follow-up.  Co Sign: Thompson Grayer, MD 01/05/2020 9:08 PM

## 2020-01-05 NOTE — ED Notes (Signed)
Patient verbalizes understanding of discharge instructions. Opportunity for questioning and answers were provided. Armband removed by staff, pt discharged from ED in wheelchair to home.   

## 2020-01-05 NOTE — Discharge Instructions (Signed)
Please keep your appointments including the A. fib clinic and EP follow-up.  Please take your medications as prescribed.  Please continue to do gentle exercise.  Drink water and avoid alcohol or any drugs. Please return to ED if you have any new or concerning symptoms.

## 2020-01-06 ENCOUNTER — Telehealth: Payer: Self-pay | Admitting: Cardiovascular Disease

## 2020-01-06 ENCOUNTER — Telehealth (HOSPITAL_COMMUNITY): Payer: Self-pay | Admitting: *Deleted

## 2020-01-06 NOTE — Progress Notes (Signed)
Cardiology Office Note:   Date:  01/07/2020  NAME:  Cindy Christian    MRN: [id number] DOB:  1971-07-29   PCP:  Ann Held, DO  Cardiologist:  Evalina Field, MD   Referring MD: Carollee Herter, Alferd Apa, *   Chief Complaint  Patient presents with  . Atrial Fibrillation   History of Present Illness:   Cindy Christian is a 49 y.o. female with a hx of paroxysmal Afib, obesity who presents for follow-up of Afib. Admitted recently with Afib with RVR. Failed TEE/DCCV and converted with flecanide. Desaturations noted during TEE concerning for sleep apnea. Also, alcohol abuse prior to admission worsening Afib. Seen in ER 4/6 with Afib with RVR and hypotension. She was urgently cardioverted with return of NSR. Needs urgent ablation.   She presents today back in atrial fibrillation. Heart rate is 126. It appears she is felt flecainide. She reports she feels a little better but still gets short of breath with exertion. She also is more aware of her heart racing. She denies any chest pain. She reports not having any lower extremity edema or swelling like she had when she was admitted to the hospital. She apparently does not have health insurance. I encouraged her to get some quickly. She is getting her medications and able to afford them currently. I do have concerns about long-term medication management if she is unable to get health insurance. She denies chest pain, shortness of breath, lower extreme edema, palpitations today.  Problem List 1. Obesity BMI 32 2. Persistent Afib -admit 3/29 for Afib with RVR  -failed TEE/DCCV  -started on flecanide with conversion  -CHADSVASC=1 -c/b diastolic HF -failed flecanide 01/07/2020  Past Medical History: Past Medical History:  Diagnosis Date  . Borderline glaucoma    [place]    Past Surgical History: Past Surgical History:  Procedure Laterality Date  . BUBBLE STUDY  12/30/2019   Procedure: BUBBLE STUDY;  Surgeon: Elouise Munroe, MD;  Location: [place];  Service: Cardiovascular;;  . CARDIOVERSION N/A 12/30/2019   Procedure: CARDIOVERSION;  Surgeon: Elouise Munroe, MD;  Location: [facility] ENDOSCOPY;  Service: Cardiovascular;  Laterality: N/A;  . FINGER FRACTURE SURGERY Left 1983   middle finger  . KNEE ARTHROPLASTY Bilateral 2000 R 2008 L  . LEEP  2004   CIN 1-2  . TEE WITHOUT CARDIOVERSION N/A 12/30/2019   Procedure: TRANSESOPHAGEAL ECHOCARDIOGRAM (TEE);  Surgeon: Elouise Munroe, MD;  Location: [facility] ENDOSCOPY;  Service: Cardiovascular;  Laterality: N/A;    Current Medications: Current Meds  Medication Sig  . apixaban (ELIQUIS) 5 MG TABS tablet Take 1 tablet (5 mg total) by mouth 2 (two) times daily.  . Ascorbic Acid (VITAMIN C) 500 MG CAPS Take 100 mg by mouth daily.   Marland Kitchen b complex vitamins tablet Take 1 tablet by mouth daily.  . Calcium Carb-Cholecalciferol (CALCIUM + D3 PO) Take 1 tablet by mouth daily.   . metoprolol succinate (TOPROL-XL) 100 MG 24 hr tablet Take 1 tablet (100 mg total) by mouth daily.  Marland Kitchen OVER THE COUNTER MEDICATION Place 1 drop into both eyes daily. Simalasin Allergy Eye Relief - homeopathic  . Zinc 50 MG CAPS Take 50 mg by mouth daily.  . [DISCONTINUED] apixaban (ELIQUIS) 5 MG TABS tablet Take 1 tablet (5 mg total) by mouth 2 (two) times daily.  . [DISCONTINUED] flecainide (TAMBOCOR) 100 MG tablet Take 1 tablet (100 mg total) by mouth every 12 (twelve) hours.  . [DISCONTINUED] metoprolol succinate (TOPROL-XL) 50 MG  24 hr tablet Take 1 tablet (50 mg total) by mouth daily.     Allergies:    Penicillins   Social History: Social History   Socioeconomic History  . Marital status: Single    Spouse name: Not on file  . Number of children: Not on file  . Years of education: Not on file  . Highest education level: Not on file  Occupational History  . Occupation: admin    Comment: Psychologist, counselling  Tobacco Use  . Smoking status: Former Smoker    Packs/day: 0.50    Years: 13.00    Pack years: 6.50     Quit date: 04/24/1998    Years since quitting: 21.7  . Smokeless tobacco: Never Used  Substance and Sexual Activity  . Alcohol use: Yes    Alcohol/week: 16.0 standard drinks    Types: 4 Glasses of wine, 12 Cans of beer per week  . Drug use: No  . Sexual activity: Yes    Partners: Male  Other Topics Concern  . Not on file  Social History Narrative   Walking daily   Social Determinants of Health   Financial Resource Strain:   . Difficulty of Paying Living Expenses:   Food Insecurity:   . Worried About Charity fundraiser in the Last Year:   . Arboriculturist in the Last Year:   Transportation Needs:   . Film/video editor (Medical):   Marland Kitchen Lack of Transportation (Non-Medical):   Physical Activity:   . Days of Exercise per Week:   . Minutes of Exercise per Session:   Stress:   . Feeling of Stress :   Social Connections:   . Frequency of Communication with Friends and Family:   . Frequency of Social Gatherings with Friends and Family:   . Attends Religious Services:   . Active Member of Clubs or Organizations:   . Attends Archivist Meetings:   Marland Kitchen Marital Status:      Family History: The patient's family history includes Diabetes in her maternal uncle; Stroke in her maternal grandmother.  ROS:   All other ROS reviewed and negative. Pertinent positives noted in the HPI.     EKGs/Labs/Other Studies Reviewed:   The following studies were personally reviewed by me today:  EKG:  EKG is ordered today.  The ekg ordered today demonstrates atrial fibrillation, heart rate 126, no acute ischemic changes, notes prior infarction, and was personally reviewed by me.   12/28/2019 TTE 12/28/2019 1. Technically difficult study due to atrial fibrillation and poor  windows. Grossly normal LV systolic function. Left ventricular ejection  fraction, by estimation, is 55 to 60%. The left ventricle has normal  function. The left ventricle has no regional  wall motion  abnormalities. There is mild left ventricular hypertrophy.  Left ventricular diastolic parameters are indeterminate.  2. Right ventricular systolic function is normal. The right ventricular  size is mildly enlarged.  3. Right atrial size was mildly dilated.  4. The mitral valve is normal in structure. Mild mitral valve  regurgitation.  5. The aortic valve was not well visualized. Aortic valve regurgitation  is not visualized. AV gradients were not measured   Recent Labs: 12/26/2019: ALT 58; B Natriuretic Peptide 179.7; TSH 5.209 12/31/2019: Magnesium 1.8 01/05/2020: BUN 14; Creatinine, Ser 1.27; Hemoglobin 14.2; Platelets 295; Potassium 4.9; Sodium 137   Recent Lipid Panel    Component Value Date/Time   CHOL 183 10/29/2019 1151   TRIG 122.0 10/29/2019 1151  HDL 48.60 10/29/2019 1151   CHOLHDL 4 10/29/2019 1151   VLDL 24.4 10/29/2019 1151   LDLCALC 110 (H) 10/29/2019 1151    Physical Exam:   VS:  BP 118/82   Pulse (!) 126   Ht 5\' 4"  (1.626 m)   Wt 190 lb (86.2 kg)   SpO2 97%   BMI 32.61 kg/m    Wt Readings from Last 3 Encounters:  01/07/20 190 lb (86.2 kg)  01/05/20 187 lb (84.8 kg)  12/31/19 194 lb 14.2 oz (88.4 kg)    General: Well nourished, well developed, in no acute distress Heart: Atraumatic, normal size  Eyes: PEERLA, EOMI  Neck: Supple, no JVD Endocrine: No thryomegaly Cardiac: Irregular rhythm, no murmurs rubs or gallops Lungs: Clear to auscultation bilaterally, no wheezing, rhonchi or rales  Abd: Soft, nontender, no hepatomegaly  Ext: No edema, pulses 2+ Musculoskeletal: No deformities, BUE and BLE strength normal and equal Skin: Warm and dry, no rashes   Neuro: Alert and oriented to person, place, time, and situation, CNII-XII grossly intact, no focal deficits  Psych: Normal mood and affect   ASSESSMENT:   Cindy Christian is a 49 y.o. female who presents for the following: 1. Persistent atrial fibrillation (HCC)   2. Obesity (BMI 30-39.9)   3. Fatigue,  unspecified type     PLAN:   1. Persistent atrial fibrillation ([place]) -Recently diagnosed in late March 2021 with rather difficult to control atrial fibrillation. She was hospitalized at [facility] and her course was complicated by diastolic heart failure. She failed an initial TEE/cardioversion and was then started on flecainide. She did convert back to rhythm on flecainide but has had recurrence of her atrial fibrillation. -She was seen in the emergency room on 01/05/2020 for rapid atrial fibrillation hypotension. She required emergent cardioversion. She presents for follow-up today and is back in atrial fibrillation. -In my opinion, she has failed flecainide. I did discuss her case with Dr. Allegra Lai. We will go ahead and plan for oral amiodarone load. We will start her on 400 mg twice daily for 7 days and then 400 mg daily for 7 days and then 200 mg daily. She will continue Eliquis 5 mg twice daily. We are hoping to get her possibly ablated a bit sooner. She will follow up with the A. fib clinic next week and we will see if we can do. Her lack of health insurance could be a big issue here. -Regarding rate control in the interim. We will increase her metoprolol to 100 mg of succinate daily. We will have to consider Cardizem versus digoxin if her rates are not better at her appointment in the A. fib clinic. I will plan to see her back in 2 months but hopefully we can get her sorted out with electrophysiology before then.  2. Obesity (BMI 30-39.9) -She does need a sleep study but lack of insurance will be an issue. For now I think we just can have to deal with the atrial fibrillation the best we can. She will go ahead and try to get health insurance and then we can pursue sleep study and other treatments.  3. Fatigue, unspecified type -needs sleep study. Due to afib.    Disposition: Return in about 2 months (around 03/08/2020).  Medication Adjustments/Labs and Tests Ordered: Current medicines are  reviewed at length with the patient today.  Concerns regarding medicines are outlined above.  No orders of the defined types were placed in this encounter.  Meds ordered this  encounter  Medications  . amiodarone (PACERONE) 200 MG tablet    Sig: Take 400 mg (2 tablets) twice daily for 1 week, then 400 mg (2 tablets) daily for 1 week, then 200 mg daily.    Dispense:  180 tablet    Refill:  2  . metoprolol succinate (TOPROL-XL) 100 MG 24 hr tablet    Sig: Take 1 tablet (100 mg total) by mouth daily.    Dispense:  90 tablet    Refill:  1  . apixaban (ELIQUIS) 5 MG TABS tablet    Sig: Take 1 tablet (5 mg total) by mouth 2 (two) times daily.    Dispense:  180 tablet    Refill:  2    Patient Instructions  Medication Instructions:  Stop Flecainide Start Amiodarone 400 mg twice daily for 1 week, then 400 mg daily for 1 week, then 200 mg for at least 30 days.   Increase Metoprolol to 100 mg daily   *If you need a refill on your cardiac medications before your next appointment, please call your pharmacy*   Follow-Up: At [facility], you and your health needs are our priority.  As part of our continuing mission to provide you with exceptional heart care, we have created designated Provider Care Teams.  These Care Teams include your primary Cardiologist (physician) and Advanced Practice Providers (APPs -  Physician Assistants and Nurse Practitioners) who all work together to provide you with the care you need, when you need it.  We recommend signing up for the patient portal called "MyChart".  Sign up information is provided on this After Visit Summary.  MyChart is used to connect with patients for Virtual Visits (Telemedicine).  Patients are able to view lab/test results, encounter notes, upcoming appointments, etc.  Non-urgent messages can be sent to your provider as well.   To learn more about what you can do with MyChart, go to [email or web address].    Your next appointment:   2  month(s)  The format for your next appointment:   In Person  Provider:   Eleonore Chiquito, MD        Time Spent with Patient: I have spent a total of 25 minutes with patient reviewing hospital notes, telemetry, EKGs, labs and examining the patient as well as establishing an assessment and plan that was discussed with the patient.  > 50% of time was spent in direct patient care.  Signed, Addison Naegeli. Audie Box, [place]  [address] [place], [place] [address] [phone]  01/07/2020 2:46 PM

## 2020-01-06 NOTE — Telephone Encounter (Signed)
Close encounter 

## 2020-01-06 NOTE — Telephone Encounter (Signed)
Left message for patient to call and reschedule 01/07/20 ETT

## 2020-01-07 ENCOUNTER — Other Ambulatory Visit: Payer: Self-pay

## 2020-01-07 ENCOUNTER — Ambulatory Visit (INDEPENDENT_AMBULATORY_CARE_PROVIDER_SITE_OTHER): Payer: Self-pay | Admitting: Cardiovascular Disease

## 2020-01-07 ENCOUNTER — Encounter: Payer: Self-pay | Admitting: Cardiovascular Disease

## 2020-01-07 ENCOUNTER — Ambulatory Visit (HOSPITAL_COMMUNITY): Admission: RE | Admit: 2020-01-07 | Payer: MEDICAID | Source: Ambulatory Visit | Attending: Medical | Admitting: Medical

## 2020-01-07 VITALS — BP 118/82 | HR 126 | Ht 64.0 in | Wt 190.0 lb

## 2020-01-07 DIAGNOSIS — I4819 Other persistent atrial fibrillation: Secondary | ICD-10-CM

## 2020-01-07 DIAGNOSIS — R5383 Other fatigue: Secondary | ICD-10-CM

## 2020-01-07 DIAGNOSIS — E669 Obesity, unspecified: Secondary | ICD-10-CM

## 2020-01-07 MED ORDER — APIXABAN 5 MG PO TABS: 5.0000 mg | ORAL_TABLET | Freq: Two times a day (BID) | ORAL | 2 refills | Status: DC

## 2020-01-07 MED ORDER — AMIODARONE HCL 200 MG PO TABS: ORAL_TABLET | ORAL | 2 refills | Status: DC

## 2020-01-07 MED ORDER — METOPROLOL SUCCINATE ER 100 MG PO TB24: 100.0000 mg | ORAL_TABLET | Freq: Every day | ORAL | 1 refills | Status: DC

## 2020-01-07 NOTE — Addendum Note (Signed)
Addended by: Jeremy Johann on: 01/07/2020 02:51 PM   Modules accepted: Orders

## 2020-01-07 NOTE — Patient Instructions (Signed)
Medication Instructions:  Stop Flecainide Start Amiodarone 400 mg twice daily for 1 week, then 400 mg daily for 1 week, then 200 mg for at least 30 days.   Increase Metoprolol to 100 mg daily   *If you need a refill on your cardiac medications before your next appointment, please call your pharmacy*   Follow-Up: At [facility], you and your health needs are our priority.  As part of our continuing mission to provide you with exceptional heart care, we have created designated Provider Care Teams.  These Care Teams include your primary Cardiologist (physician) and Advanced Practice Providers (APPs -  Physician Assistants and Nurse Practitioners) who all work together to provide you with the care you need, when you need it.  We recommend signing up for the patient portal called "MyChart".  Sign up information is provided on this After Visit Summary.  MyChart is used to connect with patients for Virtual Visits (Telemedicine).  Patients are able to view lab/test results, encounter notes, upcoming appointments, etc.  Non-urgent messages can be sent to your provider as well.   To learn more about what you can do with MyChart, go to [email or web address].    Your next appointment:   2 month(s)  The format for your next appointment:   In Person  Provider:   Eleonore Chiquito, MD

## 2020-01-11 ENCOUNTER — Other Ambulatory Visit: Payer: Self-pay

## 2020-01-11 ENCOUNTER — Ambulatory Visit (HOSPITAL_COMMUNITY)
Admission: RE | Admit: 2020-01-11 | Discharge: 2020-01-11 | Disposition: A | Discharge status: Home / Self Care | Payer: Self-pay | Source: Ambulatory Visit | Attending: Physician Assistant | Admitting: Physician Assistant

## 2020-01-11 ENCOUNTER — Encounter (HOSPITAL_COMMUNITY): Payer: Self-pay | Admitting: Physician Assistant

## 2020-01-11 VITALS — BP 116/60 | HR 67 | Ht 64.0 in | Wt 189.0 lb

## 2020-01-11 DIAGNOSIS — D6869 Other thrombophilia: Secondary | ICD-10-CM

## 2020-01-11 DIAGNOSIS — R9431 Abnormal electrocardiogram [ECG] [EKG]: Secondary | ICD-10-CM | POA: Insufficient documentation

## 2020-01-11 DIAGNOSIS — Z7901 Long term (current) use of anticoagulants: Secondary | ICD-10-CM | POA: Insufficient documentation

## 2020-01-11 DIAGNOSIS — I4510 Unspecified right bundle-branch block: Secondary | ICD-10-CM | POA: Insufficient documentation

## 2020-01-11 DIAGNOSIS — I4819 Other persistent atrial fibrillation: Secondary | ICD-10-CM

## 2020-01-11 DIAGNOSIS — Z79899 Other long term (current) drug therapy: Secondary | ICD-10-CM | POA: Insufficient documentation

## 2020-01-11 DIAGNOSIS — I4891 Unspecified atrial fibrillation: Secondary | ICD-10-CM | POA: Insufficient documentation

## 2020-01-11 NOTE — Progress Notes (Signed)
Patient presents for ECG after starting amiodarone. ECG shows SR HR 67, slow R wave prog, PR 148, QRS 96, QTc 475. Patient feels she was back in SR the evening of 01/07/20. She has not had any further symptoms and is tolerating amiodarone without difficulty. Continue loading amiodarone as prescribed. Eliquis samples given today. Plan for sleep study and possible afib ablation once she has active insurance. Follow up with Dr Curt Bears and Dr Audie Box as scheduled.

## 2020-01-13 NOTE — Telephone Encounter (Signed)
lmtcb to arrange OV f/u w/ Dr Curt Bears in next several weeks

## 2020-01-14 NOTE — Addendum Note (Signed)
Addended by: Geralynn Rile on: 01/14/2020 05:57 PM   Modules accepted: Orders

## 2020-01-15 ENCOUNTER — Telehealth: Payer: Self-pay | Admitting: Cardiology

## 2020-01-15 DIAGNOSIS — R400 Somnolence: Secondary | ICD-10-CM

## 2020-01-15 DIAGNOSIS — R5383 Other fatigue: Secondary | ICD-10-CM

## 2020-01-15 NOTE — Telephone Encounter (Signed)
New Message   Pt is returning call for Cindy Christian   Please call back

## 2020-01-15 NOTE — Telephone Encounter (Signed)
Returned pt call.  Pt is already scheduled for May 25th to see [name] to discuss treatment plan.  She mentioned that Dr. Curt Bears said something about a sleep study before determining if ablation is needed.  Pt aware I will discuss this with Dr. Curt Bears next week and let her know. Her insurance starts 5/1 so she will have to wait until after that date for any testing/OV. Aware I will let her know next week. Pt is agreeable to plan.

## 2020-01-22 ENCOUNTER — Telehealth: Payer: Self-pay | Admitting: *Deleted

## 2020-01-22 NOTE — Telephone Encounter (Signed)
Staff message sent to Gae Bon ok to schedule sleep study. According to the chart patient has no insurance.

## 2020-01-22 NOTE — Telephone Encounter (Signed)
Pt aware will need to obtain sleep study per Dr. Curt Bears. Aware office will call to arrange. Pt agreeable to plan.

## 2020-02-02 ENCOUNTER — Telehealth: Payer: Self-pay | Admitting: *Deleted

## 2020-02-02 DIAGNOSIS — R400 Somnolence: Secondary | ICD-10-CM

## 2020-02-02 NOTE — Telephone Encounter (Signed)
-----   Message from Lauralee Evener, [place] sent at 01/22/2020  2:54 PM EDT ----- Regarding: RE: sleep study According gto chart patient does not have insurance. Ok to schedule sleep study. ----- Message ----- From: Stanton Kidney, RN Sent: 01/22/2020   1:42 PM EDT To: [facility] Sleep Studies Subject: sleep study                                    Please order sleep study  (hospital d/c recommendation) Pt insurance starts 5/1 Holding an Afib ablation date for 6/25.  She see [name] on 5/25, if there is any way to get study completed prior to [facility] appt or soon after -- may not proceed w/ procedure depending on sleep study  Thx

## 2020-02-02 NOTE — Telephone Encounter (Signed)
Reached out to the patient to see if she had new insurance and she does. I have resent it to be precerted.

## 2020-02-23 ENCOUNTER — Encounter: Payer: Self-pay | Admitting: Cardiology

## 2020-02-23 ENCOUNTER — Other Ambulatory Visit: Payer: Self-pay

## 2020-02-23 ENCOUNTER — Ambulatory Visit (INDEPENDENT_AMBULATORY_CARE_PROVIDER_SITE_OTHER): Payer: 59 | Admitting: Cardiology

## 2020-02-23 VITALS — BP 142/84 | HR 68 | Ht 64.0 in

## 2020-02-23 DIAGNOSIS — I4891 Unspecified atrial fibrillation: Secondary | ICD-10-CM

## 2020-02-23 DIAGNOSIS — Z01812 Encounter for preprocedural laboratory examination: Secondary | ICD-10-CM

## 2020-02-23 DIAGNOSIS — I4819 Other persistent atrial fibrillation: Secondary | ICD-10-CM | POA: Diagnosis not present

## 2020-02-23 NOTE — Patient Instructions (Addendum)
Medication Instructions:  Your physician recommends that you continue on your current medications as directed. Please refer to the Current Medication list given to you today.  *If you need a refill on your cardiac medications before your next appointment, please call your pharmacy*   Lab Work: None ordered If you have labs (blood work) drawn today and your tests are completely normal, you will receive your results only by: Marland Kitchen MyChart Message (if you have MyChart) OR . A paper copy in the mail If you have any lab test that is abnormal or we need to change your treatment, we will call you to review the results.   Testing/Procedures: Your physician has requested that you have cardiac CT within 7 days PRIOR to your ablation. Cardiac computed tomography (CT) is a painless test that uses an x-ray machine to take clear, detailed pictures of your heart. For further information please visit [email or web address]. Please follow instruction sheet as given.  Your physician has recommended that you have an ablation. Catheter ablation is a medical procedure used to treat some cardiac arrhythmias (irregular heartbeats). During catheter ablation, a long, thin, flexible tube is put into a blood vessel in your groin (upper thigh), or neck. This tube is called an ablation catheter. It is then guided to your heart through the blood vessel. Radio frequency waves destroy small areas of heart tissue where abnormal heartbeats may cause an arrhythmia to start. Please see the instruction sheet given to you today.   Follow-Up: At [facility], you and your health needs are our priority.  As part of our continuing mission to provide you with exceptional heart care, we have created designated Provider Care Teams.  These Care Teams include your primary Cardiologist (physician) and Advanced Practice Providers (APPs -  Physician Assistants and Nurse Practitioners) who all work together to provide you with the care you need,  when you need it.  We recommend signing up for the patient portal called "MyChart".  Sign up information is provided on this After Visit Summary.  MyChart is used to connect with patients for Virtual Visits (Telemedicine).  Patients are able to view lab/test results, encounter notes, upcoming appointments, etc.  Non-urgent messages can be sent to your provider as well.   To learn more about what you can do with MyChart, go to [email or web address].    Your next appointment:   4 week(s) after your ablation  The format for your next appointment:   In Person  Provider:   AFib clinic   Thank you for choosing [facility]!!   Trinidad Curet, RN [phone]    Other Instructions  CT INSTRUCTIONS Your cardiac CT will be scheduled at:  [facility] [address] [place], [place] [address] [phone]  Please arrive at the [facility] main entrance of [facility] at ___________ on ___________, please arrive 30 minutes prior to test start time. Proceed to the [facility] Radiology Department (first floor) to check-in and test prep.  Please follow these instructions carefully (unless otherwise directed):  On the Night Before the Test: . Be sure to Drink plenty of water. . Do not consume any caffeinated/decaffeinated beverages or chocolate 12 hours prior to your test. . Do not take any antihistamines 12 hours prior to your test. . If you take Metformin do not take 24 hours prior to test.  On the Day of the Test: . Drink plenty of water. Do not drink any water within one hour of the test. . Do  not eat any food 4 hours prior to the test. . You may take your regular medications prior to the test.  . Take your metoprolol (Toprol) two hours prior to test.   . HOLD Furosemide/Hydrochlorothiazide morning of the test. . FEMALES- please wear underwire-free bra if available      After the Test: . Drink plenty of water. . After receiving IV contrast, you may  experience a mild flushed feeling. This is normal. . On occasion, you may experience a mild rash up to 24 hours after the test. This is not dangerous. If this occurs, you can take Benadryl 25 mg and increase your fluid intake. . If you experience trouble breathing, this can be serious. If it is severe call 911 IMMEDIATELY. If it is mild, please call our office. . If you take any of these medications: Glipizide/Metformin, Avandament, Glucavance, please do not take 48 hours after completing test unless otherwise instructed.   Once we have confirmed authorization from your insurance company, we will call you to set up a date and time for your test.   For non-scheduling related questions, please contact the cardiac imaging nurse navigator should you have any questions/concerns: Marchia Bond, Cardiac Imaging Nurse Navigator Burley Saver, Interim Cardiac Imaging Nurse [place] [facility] Direct Office Dial: [phone]   For scheduling needs, including cancellations and rescheduling, please call [phone].      Electrophysiology/Ablation Procedure Instructions   You are scheduled for a(n)  ablation on 03/25/2020 with Dr. Allegra Lai.   1.   Pre procedure testing-             A.  LAB WORK --- On 03/02/2020 for your pre procedure blood work.  You can stop by the office between 7:30 am - 4:30 pm.  You DO NOT need to be fasting.               B. COVID TEST-- On 03/23/2020 @ 9:00 am - You will go to [facility] hospital ([place]) for your Covid testing.   This is a drive thru test site.  There will be multiple testing areas.  Be sure to share with the first checkpoint that you are there for pre-procedure/surgery testing. This will put you into the right (yellow) lane that leads to the PAT testing team. Stay in your car and the nurse team will come to your car to test you.  After you are tested please go home and self quarantine until the day of your  procedure.     2. On the day of your procedure 03/25/2020 you will go to [facility] [phone]) at 8:30 am.  Dennis Bast will go to the main entrance A [employer]) and enter where the [employer] are.  Your driver will drop you off and you will head down the hallway to ADMITTING.  You may have one support person come in to the hospital with you.  They will be asked to wait in the waiting room.   3.   Do not eat or drink after midnight prior to your procedure.   4.   Do NOT take any medications the morning of your procedure.   5.  Plan for an overnight stay.  If you use your phone frequently bring your phone charger.   6. You will follow up with the AFIB clinic 4 weeks after your procedure.  You will follow up with Dr. Curt Bears  3 months after  your procedure.  These appointments will be made for you.   * If you have ANY questions please call the office [phone] and ask for Thomasene Dubow RN or send me a MyChart message   * Occasionally, EP Studies and ablations can become lengthy.  Please make your family aware of this before your procedure starts.  Average time ranges from 2-8 hours for EP studies/ablations.  Your physician will call your family after the procedure with the results.                                    Cardiac Ablation Cardiac ablation is a procedure to disable (ablate) a small amount of heart tissue in very specific places. The heart has many electrical connections. Sometimes these connections are abnormal and can cause the heart to beat very fast or irregularly. Ablating some of the problem areas can improve the heart rhythm or return it to normal. Ablation may be done for people who:  Have Wolff-Parkinson-White syndrome.  Have fast heart rhythms (tachycardia).  Have taken medicines for an abnormal heart rhythm (arrhythmia) that were not effective or caused side effects.  Have a high-risk heartbeat that may be life-threatening. During the procedure, a small  incision is made in the neck or the groin, and a long, thin, flexible tube (catheter) is inserted into the incision and moved to the heart. Small devices (electrodes) on the tip of the catheter will send out electrical currents. A type of X-ray (fluoroscopy) will be used to help guide the catheter and to provide images of the heart. Tell a health care provider about:  Any allergies you have.  All medicines you are taking, including vitamins, herbs, eye drops, creams, and over-the-counter medicines.  Any problems you or family members have had with anesthetic medicines.  Any blood disorders you have.  Any surgeries you have had.  Any medical conditions you have, such as kidney failure.  Whether you are pregnant or may be pregnant. What are the risks? Generally, this is a safe procedure. However, problems may occur, including:  Infection.  Bruising and bleeding at the catheter insertion site.  Bleeding into the chest, especially into the sac that surrounds the heart. This is a serious complication.  Stroke or blood clots.  Damage to other structures or organs.  Allergic reaction to medicines or dyes.  Need for a permanent pacemaker if the normal electrical system is damaged. A pacemaker is a small computer that sends electrical signals to the heart and helps your heart beat normally.  The procedure not being fully effective. This may not be recognized until months later. Repeat ablation procedures are sometimes required. What happens before the procedure?  Follow instructions from your health care provider about eating or drinking restrictions.  Ask your health care provider about: ? Changing or stopping your regular medicines. This is especially important if you are taking diabetes medicines or blood thinners. ? Taking medicines such as aspirin and ibuprofen. These medicines can thin your blood. Do not take these medicines before your procedure if your health care provider  instructs you not to.  Plan to have someone take you home from the hospital or clinic.  If you will be going home right after the procedure, plan to have someone with you for 24 hours. What happens during the procedure?  To lower your risk of infection: ? Your health care team will wash or sanitize  their hands. ? Your skin will be washed with soap. ? Hair may be removed from the incision area.  An IV tube will be inserted into one of your veins.  You will be given a medicine to help you relax (sedative).  The skin on your neck or groin will be numbed.  An incision will be made in your neck or your groin.  A needle will be inserted through the incision and into a large vein in your neck or groin.  A catheter will be inserted into the needle and moved to your heart.  Dye may be injected through the catheter to help your surgeon see the area of the heart that needs treatment.  Electrical currents will be sent from the catheter to ablate heart tissue in desired areas. There are three types of energy that may be used to ablate heart tissue: ? Heat (radiofrequency energy). ? Laser energy. ? Extreme cold (cryoablation).  When the necessary tissue has been ablated, the catheter will be removed.  Pressure will be held on the catheter insertion area to prevent excessive bleeding.  A bandage (dressing) will be placed over the catheter insertion area. The procedure may vary among health care providers and hospitals. What happens after the procedure?  Your blood pressure, heart rate, breathing rate, and blood oxygen level will be monitored until the medicines you were given have worn off.  Your catheter insertion area will be monitored for bleeding. You will need to lie still for a few hours to ensure that you do not bleed from the catheter insertion area.  Do not drive for 24 hours or as long as directed by your health care provider. Summary  Cardiac ablation is a procedure to  disable (ablate) a small amount of heart tissue in very specific places. Ablating some of the problem areas can improve the heart rhythm or return it to normal.  During the procedure, electrical currents will be sent from the catheter to ablate heart tissue in desired areas. This information is not intended to replace advice given to you by your health care provider. Make sure you discuss any questions you have with your health care provider. Document Revised: 03/10/2018 Document Reviewed: 08/06/2016 Elsevier Patient Education  2020 [employer].   .ct

## 2020-02-23 NOTE — Progress Notes (Signed)
Electrophysiology Office Note   Date:  02/23/2020   ID:  Cindy Christian, DOB 05-Oct-1970, MRN [id number]  PCP:  Carollee Herter, Alferd Apa, DO  Cardiologist:  [id number] Primary Electrophysiologist:  Meriel Kelliher Meredith Leeds, MD    Chief Complaint: AF   History of Present Illness: Cindy Christian is a 49 y.o. female who is being seen today for the evaluation of AF at the request of Ann Held, *. Presenting today for electrophysiology evaluation.  She presented to [facility] 12/26/2019 with 5 weeks of shortness of breath, abdominal bloating, and palpitations.  She was found to be in rapid atrial fibrillation.  She had a cardioversion but did not go back into sinus rhythm.  She was loaded on flecainide with repeat cardioversion getting her back into sinus rhythm.  She presented back to cardiology clinic and was found to be back in atrial fibrillation and has since been loaded on amiodarone.  Today, she denies symptoms of palpitations, chest pain, shortness of breath, orthopnea, PND, lower extremity edema, claudication, dizziness, presyncope, syncope, bleeding, or neurologic sequela. The patient is tolerating medications without difficulties.  Had 3 episodes of palpitations since being put on amiodarone.  These have lasted a few hours at a time.  Her maximum heart rate has been in the 140s, but the other 2 episodes with heart rates in the 90s.  She felt palpitations during these episodes.  She was also weak, fatigued, and short of breath.  In between those episodes she is done well without complaint.  She would like to have an ablation performed.   Past Medical History:  Diagnosis Date  . Borderline glaucoma    popa   Past Surgical History:  Procedure Laterality Date  . BUBBLE STUDY  12/30/2019   Procedure: BUBBLE STUDY;  Surgeon: Elouise Munroe, MD;  Location[place];  Service: Cardiovascular;;  . CARDIOVERSION N/A 12/30/2019   Procedure: CARDIOVERSION;  Surgeon: Elouise Munroe, MD;  Location: [facility] ENDOSCOPY;  Service: Cardiovascular;  Laterality: N/A;  . FINGER FRACTURE SURGERY Left 1983   middle finger  . KNEE ARTHROPLASTY Bilateral 2000 R 2008 L  . LEEP  2004   CIN 1-2  . TEE WITHOUT CARDIOVERSION N/A 12/30/2019   Procedure: TRANSESOPHAGEAL ECHOCARDIOGRAM (TEE);  Surgeon: Elouise Munroe, MD;  Location: [facility] ENDOSCOPY;  Service: Cardiovascular;  Laterality: N/A;     Current Outpatient Medications  Medication Sig Dispense Refill  . amiodarone (PACERONE) 200 MG tablet Take 200 mg by mouth daily.    Marland Kitchen apixaban (ELIQUIS) 5 MG TABS tablet Take 1 tablet (5 mg total) by mouth 2 (two) times daily. 180 tablet 2  . Ascorbic Acid (VITAMIN C) 500 MG CAPS Take 100 mg by mouth daily.     Marland Kitchen b complex vitamins tablet Take 1 tablet by mouth daily.    . Calcium Carb-Cholecalciferol (CALCIUM + D3 PO) Take 1 tablet by mouth daily.     . metoprolol succinate (TOPROL-XL) 50 MG 24 hr tablet Take 50 mg by mouth daily. Take with or immediately following a meal.    . OVER THE COUNTER MEDICATION Place 1 drop into both eyes daily. Simalasin Allergy Eye Relief - homeopathic    . Zinc 50 MG CAPS Take 50 mg by mouth daily.     No current facility-administered medications for this visit.    Allergies:   Penicillins   Social History:  The patient  reports that she quit smoking about 21 years ago. She has a  6.50 pack-year smoking history. She has never used smokeless tobacco. She reports previous alcohol use of about 16.0 standard drinks of alcohol per week. She reports that she does not use drugs.   Family History:  The patient's family history includes Diabetes in her maternal uncle; Stroke in her maternal grandmother.    ROS:  Please see the history of present illness.   Otherwise, review of systems is positive for none.   All other systems are reviewed and negative.    PHYSICAL EXAM: VS:  BP (!) 142/84   Pulse 68   Ht 5\' 4"  (1.626 m)   SpO2 98%   BMI 32.44 kg/m  , BMI Body  mass index is 32.44 kg/m. GEN: Well nourished, well developed, in no acute distress  HEENT: normal  Neck: no JVD, carotid bruits, or masses Cardiac: RRR; no murmurs, rubs, or gallops,no edema  Respiratory:  clear to auscultation bilaterally, normal work of breathing GI: soft, nontender, nondistended, + BS MS: no deformity or atrophy  Skin: warm and dry Neuro:  Strength and sensation are intact Psych: euthymic mood, full affect  EKG:  EKG is ordered today. Personal review of the ekg ordered shows sinus rhythm, rate 68  Recent Labs: 12/26/2019: ALT 58; B Natriuretic Peptide 179.7; TSH 5.209 12/31/2019: Magnesium 1.8 01/05/2020: BUN 14; Creatinine, Ser 1.27; Hemoglobin 14.2; Platelets 295; Potassium 4.9; Sodium 137    Lipid Panel     Component Value Date/Time   CHOL 183 10/29/2019 1151   TRIG 122.0 10/29/2019 1151   HDL 48.60 10/29/2019 1151   CHOLHDL 4 10/29/2019 1151   VLDL 24.4 10/29/2019 1151   LDLCALC 110 (H) 10/29/2019 1151     Wt Readings from Last 3 Encounters:  01/11/20 189 lb (85.7 kg)  01/07/20 190 lb (86.2 kg)  01/05/20 187 lb (84.8 kg)      Other studies Reviewed: Additional studies/ records that were reviewed today include: TTE 12/28/19  Review of the above records today demonstrates:  1. Technically difficult study due to atrial fibrillation and poor  windows. Grossly normal LV systolic function. Left ventricular ejection  fraction, by estimation, is 55 to 60%. The left ventricle has normal  function. The left ventricle has no regional  wall motion abnormalities. There is mild left ventricular hypertrophy.  Left ventricular diastolic parameters are indeterminate.  2. Right ventricular systolic function is normal. The right ventricular  size is mildly enlarged.  3. Right atrial size was mildly dilated.  4. The mitral valve is normal in structure. Mild mitral valve  regurgitation.  5. The aortic valve was not well visualized. Aortic valve regurgitation    is not visualized. AV gradients were not measured    ASSESSMENT AND PLAN:  1.  Persistent atrial fibrillation: Currently on amiodarone and Eliquis.  CHA2DS2-VASc of one.  At this point, she would prefer ablation to amiodarone.  She is continued to have episodes of atrial fibrillation despite amiodarone.  Risks and benefits were discussed.  Risks include bleeding, tamponade, heart block, stroke, damage surrounding organs.  She understands these risks and is agreed to the procedure.  2.  Obesity: Diet and exercise encouraged.  She is working on getting a sleep study.  Case discussed with referring cardiologist  Current medicines are reviewed at length with the patient today.   The patient does not have concerns regarding her medicines.  The following changes were made today:  none  Labs/ tests ordered today include:  Orders Placed This Encounter  Procedures  .  CT CARDIAC MORPH/PULM VEIN W/CM&W/O CA SCORE  . Basic metabolic panel  . CBC  . EKG 12-Lead     Disposition:   FU with Tuesday Terlecki 3 months  Signed, Burnice Oestreicher Meredith Leeds, MD  02/23/2020 12:07 PM     [address] [place] [address] [phone] (office) [phone] (fax)

## 2020-02-25 ENCOUNTER — Telehealth: Payer: Self-pay | Admitting: *Deleted

## 2020-02-25 NOTE — Telephone Encounter (Signed)
PA for sleep study faxed to [facility].

## 2020-02-25 NOTE — Telephone Encounter (Signed)
-----   Message from Freada Bergeron, [place] sent at 02/01/2020  3:10 PM EDT ----- Regarding: [employer] is in now as of 01/30/20.

## 2020-02-26 ENCOUNTER — Encounter (HOSPITAL_BASED_OUTPATIENT_CLINIC_OR_DEPARTMENT_OTHER): Payer: Self-pay | Admitting: *Deleted

## 2020-02-26 ENCOUNTER — Other Ambulatory Visit: Payer: Self-pay

## 2020-02-26 ENCOUNTER — Emergency Department (HOSPITAL_BASED_OUTPATIENT_CLINIC_OR_DEPARTMENT_OTHER)
Admission: EM | Admit: 2020-02-26 | Discharge: 2020-02-26 | Disposition: A | Discharge status: Home / Self Care | Payer: 59 | Attending: Emergency Medicine | Admitting: Emergency Medicine

## 2020-02-26 DIAGNOSIS — Z87891 Personal history of nicotine dependence: Secondary | ICD-10-CM | POA: Insufficient documentation

## 2020-02-26 DIAGNOSIS — R002 Palpitations: Secondary | ICD-10-CM | POA: Diagnosis present

## 2020-02-26 DIAGNOSIS — Z79899 Other long term (current) drug therapy: Secondary | ICD-10-CM | POA: Insufficient documentation

## 2020-02-26 DIAGNOSIS — R200 Anesthesia of skin: Secondary | ICD-10-CM | POA: Insufficient documentation

## 2020-02-26 DIAGNOSIS — Z7901 Long term (current) use of anticoagulants: Secondary | ICD-10-CM | POA: Diagnosis not present

## 2020-02-26 DIAGNOSIS — Z96651 Presence of right artificial knee joint: Secondary | ICD-10-CM | POA: Insufficient documentation

## 2020-02-26 DIAGNOSIS — Z96652 Presence of left artificial knee joint: Secondary | ICD-10-CM | POA: Insufficient documentation

## 2020-02-26 DIAGNOSIS — R079 Chest pain, unspecified: Secondary | ICD-10-CM | POA: Diagnosis not present

## 2020-02-26 DIAGNOSIS — R202 Paresthesia of skin: Secondary | ICD-10-CM | POA: Diagnosis not present

## 2020-02-26 DIAGNOSIS — I4891 Unspecified atrial fibrillation: Secondary | ICD-10-CM | POA: Insufficient documentation

## 2020-02-26 DIAGNOSIS — R42 Dizziness and giddiness: Secondary | ICD-10-CM | POA: Diagnosis not present

## 2020-02-26 LAB — COMPREHENSIVE METABOLIC PANEL
ALT: 34 U/L (ref 0–44)
AST: 36 U/L (ref 15–41)
Albumin: 4.5 g/dL (ref 3.5–5.0)
Alkaline Phosphatase: 44 U/L (ref 38–126)
Anion gap: 11 (ref 5–15)
BUN: 12 mg/dL (ref 6–20)
CO2: 25 mmol/L (ref 22–32)
Calcium: 9.4 mg/dL (ref 8.9–10.3)
Chloride: 102 mmol/L (ref 98–111)
Creatinine, Ser: 0.57 mg/dL (ref 0.44–1.00)
GFR calc Af Amer: >60 mL/min (ref >60)
GFR calc non Af Amer: >60 mL/min (ref >60)
Glucose, Bld: 99 mg/dL (ref 70–99)
Potassium: 3.7 mmol/L (ref 3.5–5.1)
Sodium: 138 mmol/L (ref 135–145)
Total Bilirubin: 1 mg/dL (ref 0.3–1.2)
Total Protein: 7.9 g/dL (ref 6.5–8.1)

## 2020-02-26 LAB — CBC WITH DIFFERENTIAL/PLATELET
Abs Immature Granulocytes: 0.03 10*3/uL (ref 0.00–0.07)
Basophils Absolute: 0.1 10*3/uL (ref 0.0–0.1)
Basophils Relative: 1 %
Eosinophils Absolute: 0.1 10*3/uL (ref 0.0–0.5)
Eosinophils Relative: 2 %
HCT: 42.5 % (ref 36.0–46.0)
Hemoglobin: 14.2 g/dL (ref 12.0–15.0)
Immature Granulocytes: 0 %
Lymphocytes Relative: 37 %
Lymphs Abs: 2.5 10*3/uL (ref 0.7–4.0)
MCH: 31.3 pg (ref 26.0–34.0)
MCHC: 33.4 g/dL (ref 30.0–36.0)
MCV: 93.6 fL (ref 80.0–100.0)
Monocytes Absolute: 0.6 10*3/uL (ref 0.1–1.0)
Monocytes Relative: 9 %
Neutro Abs: 3.5 10*3/uL (ref 1.7–7.7)
Neutrophils Relative %: 51 %
Platelets: 251 10*3/uL (ref 150–400)
RBC: 4.54 MIL/uL (ref 3.87–5.11)
RDW: 13.6 % (ref 11.5–15.5)
WBC: 6.7 10*3/uL (ref 4.0–10.5)
nRBC: 0 % (ref 0.0–0.2)

## 2020-02-26 LAB — TROPONIN I (HIGH SENSITIVITY): Troponin I (High Sensitivity): 5 ng/L (ref <18)

## 2020-02-26 MED ORDER — METOPROLOL TARTRATE 5 MG/5ML IV SOLN
2.5000 mg | Freq: Once | INTRAVENOUS | Status: AC
  Administered 2020-02-26: 2.5 mg via INTRAVENOUS
  Filled 2020-02-26: qty 5

## 2020-02-26 MED ORDER — METOPROLOL TARTRATE 5 MG/5ML IV SOLN
2.5000 mg | Freq: Once | INTRAVENOUS | Status: AC
  Administered 2020-02-26: 2.5 mg via INTRAVENOUS

## 2020-02-26 NOTE — ED Provider Notes (Signed)
MSE was initiated and I personally evaluated the patient and placed orders (if any) at  1:49 PM on Feb 26, 2020.  The patient appears stable so that the remainder of the MSE may be completed by another provider.  Patient with history of a fib on amiodarone, metoprolol, eloquence here for evaluation of burning in her chest with numbness in the left hand as well as cold sensation and bilateral feet and hands. EKG with sinus rhythm. She has 2+ radial and DP pulses bilaterally with five out of five strength in all four extremities with sensation to light touch intact in all four extremities. Patient is stable for evaluation by oncoming provider.   Quintella Reichert, MD 02/26/20 [phone]

## 2020-02-26 NOTE — Discharge Instructions (Addendum)
You were seen in the emergency department for some chest pain and tingling in your hands and feet.  Then you went into rapid atrial fibrillation.  Your lab work did not show any serious abnormalities.  You converted back into sinus rhythm with some medication.  Please contact your cardiologist for close follow-up.  Return to the emergency department if any worsening or concerning symptoms

## 2020-02-26 NOTE — ED Notes (Signed)
Pt. In now in SR with HR of 77-68.

## 2020-02-26 NOTE — ED Notes (Signed)
Pt. HR still at 133 after med ordered given.  HR down to 117 -122 for approx. 5 mins then back up to 133-144 with Pt. Able to tolerate.  Infromed EDP with new orders given.

## 2020-02-26 NOTE — ED Provider Notes (Signed)
[facility] DEPARTMENT Provider Note   CSN: [id number] Arrival date & time: 02/26/20  1302     History Chief Complaint  Patient presents with  . Numbness    Cindy Christian is a 49 y.o. female.  She is a fairly new diagnosis of A. fib and is on Eliquis metoprolol and amiodarone.  She has seen Dr. Martinique and Dr. Curt Bears and is planned to have an ablation next month.  This morning she had some burning in her chest radiating down her left arm. Tingling in hands and feet.  She was in sinus at that time.  Since about 2 PM she is back in rapid A. fib.  She said her rates go between 140s and 200s.  Causes her to feel a little anxious and not herself.  The history is provided by the patient.  Palpitations Palpitations quality:  Fast Onset quality:  Sudden Duration:  90 minutes Timing:  Constant Progression:  Unchanged Chronicity:  Recurrent Relieved by:  Nothing Worsened by:  Nothing Ineffective treatments:  Valsalva Associated symptoms: chest pain, dizziness and numbness   Associated symptoms: no back pain, no cough, no lower extremity edema, no nausea, no shortness of breath and no vomiting   Risk factors: hx of atrial fibrillation        Past Medical History:  Diagnosis Date  . Borderline glaucoma    popa    Patient Active Problem List   Diagnosis Date Noted  . Secondary hypercoagulable state ([place]) 01/11/2020  . Persistent atrial fibrillation ([place]) 12/27/2019  . Atrial fibrillation with RVR ([place]) 12/26/2019    Past Surgical History:  Procedure Laterality Date  . BUBBLE STUDY  12/30/2019   Procedure: BUBBLE STUDY;  Surgeon: Elouise Munroe, MD;  Location: [facility];  Service: Cardiovascular;;  . CARDIOVERSION N/A 12/30/2019   Procedure: CARDIOVERSION;  Surgeon: Elouise Munroe, MD;  Location: [facility] ENDOSCOPY;  Service: Cardiovascular;  Laterality: N/A;  . FINGER FRACTURE SURGERY Left 1983   middle finger  . KNEE ARTHROPLASTY Bilateral 2000 R 2008 L   . LEEP  2004   CIN 1-2  . TEE WITHOUT CARDIOVERSION N/A 12/30/2019   Procedure: TRANSESOPHAGEAL ECHOCARDIOGRAM (TEE);  Surgeon: Elouise Munroe, MD;  Location[facility] END[facility]COPY;  Service: Cardiovascular;  Laterality: N/A;     OB History   No obstetric history on file.     Family History  Problem Relation Age of Onset  . Diabetes Maternal Uncle        Type 1  . Stroke Maternal Grandmother     Social History   Tobacco Use  . Smoking status: Former Smoker    Packs/day: 0.50    Years: 13.00    Pack years: 6.50    Quit date: 04/24/1998    Years since quitting: 21.8  . Smokeless tobacco: Never Used  Substance Use Topics  . Alcohol use: Not Currently    Alcohol/week: 16.0 standard drinks    Types: 4 Glasses of wine, 12 Cans of beer per week  . Drug use: No    Home Medications Prior to Admission medications   Medication Sig Start Date End Date Taking? Authorizing Provider  amiodarone (PACERONE) 200 MG tablet Take 200 mg by mouth daily.    [provider]  apixaban (ELIQUIS) 5 MG TABS tablet Take 1 tablet (5 mg total) by mouth 2 (two) times daily. 01/07/20   O'NealCassie Freer, MD  Ascorbic Acid (VITAMIN C) 500 MG CAPS Take 100 mg by mouth daily.  [provider]  b complex vitamins tablet Take 1 tablet by mouth daily.    [provider]  Calcium Carb-Cholecalciferol (CALCIUM + D3 PO) Take 1 tablet by mouth daily.     [provider]  metoprolol succinate (TOPROL-XL) 50 MG 24 hr tablet Take 50 mg by mouth daily. Take with or immediately following a meal.    [provider]  OVER THE COUNTER MEDICATION Place 1 drop into both eyes daily. Simalasin Allergy Eye Relief - homeopathic    [provider]  Zinc 50 MG CAPS Take 50 mg by mouth daily.    [provider]    Allergies    Penicillins  Review of Systems   Review of Systems  Constitutional: Negative for fever.  HENT: Negative for sore throat.   Eyes:  Negative for visual disturbance.  Respiratory: Negative for cough and shortness of breath.   Cardiovascular: Positive for chest pain and palpitations.  Gastrointestinal: Negative for abdominal pain, nausea and vomiting.  Genitourinary: Negative for dysuria.  Musculoskeletal: Negative for back pain.  Skin: Negative for rash.  Neurological: Positive for dizziness and numbness. Negative for speech difficulty.    Physical Exam Updated Vital Signs BP 130/70   Pulse (!) 130   Temp 98.8 F (37.1 C) (Oral)   Resp 17   Ht 5\' 4"  (1.626 m)   Wt 84.4 kg   LMP 02/05/2020   SpO2 96%   BMI 31.93 kg/m   Physical Exam Vitals and nursing note reviewed.  Constitutional:      General: She is not in acute distress.    Appearance: Normal appearance. She is well-developed.  HENT:     Head: Normocephalic and atraumatic.  Eyes:     Conjunctiva/sclera: Conjunctivae normal.  Cardiovascular:     Rate and Rhythm: Tachycardia present. Rhythm irregular.     Pulses: Normal pulses.     Heart sounds: No murmur.  Pulmonary:     Effort: Pulmonary effort is normal. No respiratory distress.     Breath sounds: Normal breath sounds.  Abdominal:     Palpations: Abdomen is soft.     Tenderness: There is no abdominal tenderness.  Musculoskeletal:        General: Normal range of motion.     Cervical back: Neck supple.     Right lower leg: No edema.     Left lower leg: No edema.  Skin:    General: Skin is warm and dry.     Capillary Refill: Capillary refill takes less than 2 seconds.  Neurological:     General: No focal deficit present.     Mental Status: She is alert.     Sensory: No sensory deficit.     Motor: No weakness.     Gait: Gait normal.     ED Results / Procedures / Treatments   Labs (all labs ordered are listed, but only abnormal results are displayed) Labs Reviewed  COMPREHENSIVE METABOLIC PANEL  CBC WITH DIFFERENTIAL/PLATELET  TROPONIN I (HIGH SENSITIVITY)    EKG EKG  Interpretation  Date/Time:  Friday Feb 26 2020 13:29:23 EDT Ventricular Rate:  76 PR Interval:  136 QRS Duration: 92 QT Interval:  410 QTC Calculation: 461 R Axis:   57 Text Interpretation: Normal sinus rhythm Incomplete right bundle branch block Borderline ECG Confirmed by Quintella Reichert [phone]) on 02/26/2020 1:40:32 PM   Radiology No results found.  Procedures .Critical Care Performed by: Hayden Rasmussen, MD Authorized by: Hayden Rasmussen, MD  Critical care provider statement:    Critical care time (minutes):  45   Critical care time was exclusive of:  Separately billable procedures and treating other patients   Critical care was necessary to treat or prevent imminent or life-threatening deterioration of the following conditions:  Cardiac failure   Critical care was time spent personally by me on the following activities:  Discussions with consultants, evaluation of patient's response to treatment, examination of patient, ordering and performing treatments and interventions, ordering and review of laboratory studies, pulse oximetry, re-evaluation of patient's condition, obtaining history from patient or surrogate, review of old charts and development of treatment plan with patient or surrogate   I assumed direction of critical care for this patient from another provider in my specialty: no     (including critical care time)  Medications Ordered in ED Medications  metoprolol tartrate (LOPRESSOR) injection 2.5 mg (2.5 mg Intravenous Given 02/26/20 1557)  metoprolol tartrate (LOPRESSOR) injection 2.5 mg (2.5 mg Intravenous Given 02/26/20 1621)  metoprolol tartrate (LOPRESSOR) injection 2.5 mg (2.5 mg Intravenous Given 02/26/20 1709)    ED Course  I have reviewed the triage vital signs and the nursing notes.  Pertinent labs & imaging results that were available during my care of the patient were reviewed by me and considered in my medical decision making (see chart for  details).  Clinical Course as of Feb 26 1735  Fri Feb 26, 2020  1541 Discussed with Dr. Harrell Gave from [facility] cardiology.  She felt that the best course would be to give her some IV beta-blocker and see if she will convert on her own.  She does not recommend IV amnio as she is taking it already orally and is already fully loaded.   [MB]  1723 Third dose of IV Lopressor patient broke into sinus.  Currently heart rate in the 70s.  Blood pressure remaining stable   [MB]  1733 Patient's remained in sinus and feels well.  Will discharge.  She has a follow-up with cardiology next week.  Return instructions discussed.   [MB]    Clinical Course User Index [MB] Hayden Rasmussen, MD   MDM Rules/Calculators/A&P                     This patient complains of nonspecific chest pain and rapid A. fib; this involves an extensive number of treatment Options and is a complaint that carries with it a high risk of complications and Morbidity. The differential includes ACS, arrhythmia, metabolic derangement  I ordered, reviewed and interpreted labs, which included CBC normal chemistries normal troponin not elevated I ordered medication IV metoprolol 3 doses Additional history obtained from patient significant other Previous records obtained and reviewed in epic including last cardiology notes from Dr. Curt Bears I consulted Dr. Harrell Gave cardiology and discussed lab and imaging findings  Critical Interventions: IV beta-blocker for treatment of A. fib with rapid ventricular response  After the interventions stated above, I reevaluated the patient and found patient to have converted back into sinus rhythm.  Hemodynamically stable.  All symptoms have resolved.  She is comfortable going home and continuing to monitor her heart rate.  Return instructions discussed.  CHA2DS2/VAS Stroke Risk Points  Current as of 3 minutes ago     1 >= 2 Points: High Risk  1 - 1.99 Points: Medium Risk  0 Points: Low Risk    The  patient's score has not changed in the past year.: No Change  Details    This score determines the patient's risk of having a stroke if the  patient has atrial fibrillation.       Points Metrics  0 Has Congestive Heart Failure:  No    Current as of 3 minutes ago  0 Has Vascular Disease:  No    Current as of 3 minutes ago  0 Has Hypertension:  No    Current as of 3 minutes ago  0 Age:  87    Current as of 3 minutes ago  0 Has Diabetes:  No    Current as of 3 minutes ago  0 Had Stroke:  No  Had TIA:  No  Had thromboembolism:  No    Current as of 3 minutes ago  1 Female:  Yes    Current as of 3 minutes ago           Final Clinical Impression(s) / ED Diagnoses Final diagnoses:  Atrial fibrillation with rapid ventricular response (HCC)  Nonspecific chest pain  Paresthesia    Rx / DC Orders ED Discharge Orders    None       Hayden Rasmussen, MD 02/27/20 1332

## 2020-02-26 NOTE — ED Triage Notes (Signed)
Her left arm and both feet feel numb. She is rocking back and forth in the triage chair.

## 2020-02-28 NOTE — Progress Notes (Signed)
Cardiology Office Note:   Date:  03/02/2020  NAME:  Cindy Christian    MRN: [id number] DOB:  Oct 29, 1970   PCP:  Ann Held, DO  Cardiologist:  Evalina Field, MD  Electrophysiologist:  Constance Haw, MD   Referring MD: Carollee Herter, Alferd Apa, *   Chief Complaint  Patient presents with  . Atrial Fibrillation   History of Present Illness:   Cindy Christian is a 49 y.o. female with a hx of obesity, persistent Afib who presents for follow-up of Afib. Back in ER 5.28 for Afib with RVR. Given IV metoprolol and converted back to NSR. Afib ablation planned by Dr. Curt Bears.  She reports she is in good spirits by having a recurrence of atrial fibrillation.  She seems to be doing much better.  She remains on amiodarone metoprolol.  She does notice intermittent episodes of palpitations but nothing is been sustained like when she was in the emergency room.  She reports she is quit drinking.  No alcohol intake reported at all.  We are also awaiting her sleep study.  She did get insurance.  She reports she is walking up to 2 to 3 miles per day.  She has also lost 10 pounds.  She has plans to get down to 155 pounds.  She remains on Eliquis.  No bleeding issues reported.  She remains on amiodarone without any major issues.  Also on metoprolol succinate 50 mg daily.  She denies any chest pain or shortness of breath today.  No palpitations in office.  She reports of intermittent arm pain.  She will have a CT pulmonary vein study.  I have reached out to the coordinator to give her nitroglycerin so we can get a good look at the coronary arteries 2.  She has no real risk factors for CVD other than obesity.  Problem List 1. Obesity BMI 32 2. Persistent Afib -admit 3/29 for Afib with RVR  -failed TEE/DCCV  -started on flecanide with conversion  -CHADSVASC=1 (female) -c/b diastolic HF -failed flecanide 01/07/2020  Past Medical History: Past Medical History:  Diagnosis Date  . Borderline glaucoma    popa      Past Surgical History: Past Surgical History:  Procedure Laterality Date  . BUBBLE STUDY  12/30/2019   Procedure: BUBBLE STUDY;  Surgeon: Elouise Munroe, MD;  Location: [place];  Service: Cardiovascular;;  . CARDIOVERSION N/A 12/30/2019   Procedure: CARDIOVERSION;  Surgeon: Elouise Munroe, MD;  Location: [facility] ENDOSCOPY;  Service: Cardiovascular;  Laterality: N/A;  . FINGER FRACTURE SURGERY Left 1983   middle finger  . KNEE ARTHROPLASTY Bilateral 2000 R 2008 L  . LEEP  2004   CIN 1-2  . TEE WITHOUT CARDIOVERSION N/A 12/30/2019   Procedure: TRANSESOPHAGEAL ECHOCARDIOGRAM (TEE);  Surgeon: Elouise Munroe, MD;  Location: [facility] ENDOSCOPY;  Service: Cardiovascular;  Laterality: N/A;    Current Medications: Current Meds  Medication Sig  . amiodarone (PACERONE) 200 MG tablet Take 1 tablet (200 mg total) by mouth daily.  Marland Kitchen apixaban (ELIQUIS) 5 MG TABS tablet Take 1 tablet (5 mg total) by mouth 2 (two) times daily.  . Ascorbic Acid (VITAMIN C) 500 MG CAPS Take 100 mg by mouth daily.   Marland Kitchen b complex vitamins tablet Take 1 tablet by mouth daily.  . Calcium Carb-Cholecalciferol (CALCIUM + D3 PO) Take 1 tablet by mouth daily.   . metoprolol succinate (TOPROL-XL) 50 MG 24 hr tablet Take 50 mg by mouth daily. Take with or  immediately following a meal.  . OVER THE COUNTER MEDICATION Place 1 drop into both eyes daily. Simalasin Allergy Eye Relief - homeopathic  . Zinc 50 MG CAPS Take 50 mg by mouth daily.  . [DISCONTINUED] amiodarone (PACERONE) 200 MG tablet Take 200 mg by mouth daily.  . [DISCONTINUED] apixaban (ELIQUIS) 5 MG TABS tablet Take 1 tablet (5 mg total) by mouth 2 (two) times daily.     Allergies:    Penicillins   Social History: Social History   Socioeconomic History  . Marital status: Single    Spouse name: Not on file  . Number of children: Not on file  . Years of education: Not on file  . Highest education level: Not on file  Occupational History  . Occupation:  admin    Comment: Psychologist, counselling  Tobacco Use  . Smoking status: Former Smoker    Packs/day: 0.50    Years: 13.00    Pack years: 6.50    Quit date: 04/24/1998    Years since quitting: 21.8  . Smokeless tobacco: Never Used  Substance and Sexual Activity  . Alcohol use: Not Currently    Alcohol/week: 16.0 standard drinks    Types: 4 Glasses of wine, 12 Cans of beer per week  . Drug use: No  . Sexual activity: Yes    Partners: Male  Other Topics Concern  . Not on file  Social History Narrative   Walking daily   Social Determinants of Health   Financial Resource Strain:   . Difficulty of Paying Living Expenses:   Food Insecurity:   . Worried About Charity fundraiser in the Last Year:   . Arboriculturist in the Last Year:   Transportation Needs:   . Film/video editor (Medical):   Marland Kitchen Lack of Transportation (Non-Medical):   Physical Activity:   . Days of Exercise per Week:   . Minutes of Exercise per Session:   Stress:   . Feeling of Stress :   Social Connections:   . Frequency of Communication with Friends and Family:   . Frequency of Social Gatherings with Friends and Family:   . Attends Religious Services:   . Active Member of Clubs or Organizations:   . Attends Archivist Meetings:   Marland Kitchen Marital Status:     Family History: The patient's family history includes Diabetes in her maternal uncle; Stroke in her maternal grandmother.  ROS:   All other ROS reviewed and negative. Pertinent positives noted in the HPI.     EKGs/Labs/Other Studies Reviewed:   The following studies were personally reviewed by me today:  Recent Labs: 12/26/2019: B Natriuretic Peptide 179.7; TSH 5.209 12/31/2019: Magnesium 1.8 02/26/2020: ALT 34; BUN 12; Creatinine, Ser 0.57; Hemoglobin 14.2; Platelets 251; Potassium 3.7; Sodium 138   Recent Lipid Panel    Component Value Date/Time   CHOL 183 10/29/2019 1151   TRIG 122.0 10/29/2019 1151   HDL 48.60 10/29/2019 1151    CHOLHDL 4 10/29/2019 1151   VLDL 24.4 10/29/2019 1151   LDLCALC 110 (H) 10/29/2019 1151    Physical Exam:   VS:  BP 124/80   Pulse 77   Temp (!) 97 F (36.1 C)   Ht 5\' 4"  (1.626 m)   Wt 185 lb (83.9 kg)   LMP 02/05/2020   SpO2 96%   BMI 31.76 kg/m    Wt Readings from Last 3 Encounters:  03/02/20 185 lb (83.9 kg)  02/26/20 186 lb (84.4 kg)  01/11/20 189 lb (85.7 kg)    General: Well nourished, well developed, in no acute distress Heart: Atraumatic, normal size  Eyes: PEERLA, EOMI  Neck: Supple, no JVD Endocrine: No thryomegaly Cardiac: Normal S1, S2; RRR; no murmurs, rubs, or gallops Lungs: Clear to auscultation bilaterally, no wheezing, rhonchi or rales  Abd: Soft, nontender, no hepatomegaly  Ext: No edema, pulses 2+ Musculoskeletal: No deformities, BUE and BLE strength normal and equal Skin: Warm and dry, no rashes   Neuro: Alert and oriented to person, place, time, and situation, CNII-XII grossly intact, no focal deficits  Psych: Normal mood and affect   ASSESSMENT:   Cindy Christian is a 49 y.o. female who presents for the following: 1. Persistent atrial fibrillation (HCC)   2. Obesity (BMI 30-39.9)     PLAN:   1. Persistent atrial fibrillation (HCC) 2. Obesity (BMI 30-39.9) -Chads vas score of 1 due to female sex.  She remains on Eliquis that she will have an ablation.  She is had intermittent episodes of recurrent atrial fibrillation despite amiodarone and metoprolol.  She did not tolerate flecainide.  Echocardiogram in the hospital showed normal LV function.  She had desaturations and hypoxia with her TEE procedure and I suspect she has undiagnosed sleep apnea.  We are awaiting her sleep study.  She did get insurance.  She has no structural heart disease to explain her A. fib.  Suspect this is all related to obesity as well as undiagnosed sleep apnea.  For now we will continue with amiodarone and metoprolol.  She will continue on Eliquis.  She was scheduled in the month  for an atrial fibrillation ablation with Dr. Allegra Lai.  We will plan to see her back in 6 months.  I encouraged weight loss and exercise as well.  Disposition: Return in about 6 months (around 09/01/2020).  Medication Adjustments/Labs and Tests Ordered: Current medicines are reviewed at length with the patient today.  Concerns regarding medicines are outlined above.  No orders of the defined types were placed in this encounter.  Meds ordered this encounter  Medications  . apixaban (ELIQUIS) 5 MG TABS tablet    Sig: Take 1 tablet (5 mg total) by mouth 2 (two) times daily.    Dispense:  180 tablet    Refill:  2  . amiodarone (PACERONE) 200 MG tablet    Sig: Take 1 tablet (200 mg total) by mouth daily.    Dispense:  90 tablet    Refill:  1    Patient Instructions  Medication Instructions:  The current medical regimen is effective;  continue present plan and medications.  *If you need a refill on your cardiac medications before your next appointment, please call your pharmacy*   Follow-Up: At [facility], you and your health needs are our priority.  As part of our continuing mission to provide you with exceptional heart care, we have created designated Provider Care Teams.  These Care Teams include your primary Cardiologist (physician) and Advanced Practice Providers (APPs -  Physician Assistants and Nurse Practitioners) who all work together to provide you with the care you need, when you need it.  We recommend signing up for the patient portal called "MyChart".  Sign up information is provided on this After Visit Summary.  MyChart is used to connect with patients for Virtual Visits (Telemedicine).  Patients are able to view lab/test results, encounter notes, upcoming appointments, etc.  Non-urgent messages can be sent to your provider as well.   To  learn more about what you can do with MyChart, go to [email or web address].    Your next appointment:   6 month(s)  The format  for your next appointment:   In Person  Provider:   Eleonore Chiquito, MD       Time Spent with Patient: I have spent a total of 25 minutes with patient reviewing hospital notes, telemetry, EKGs, labs and examining the patient as well as establishing an assessment and plan that was discussed with the patient.  > 50% of time was spent in direct patient care.  Signed, Addison Naegeli. Audie Box, [place]  [address] [place], [place] [address] [phone]  03/02/2020 3:07 PM

## 2020-03-02 ENCOUNTER — Other Ambulatory Visit: Payer: Self-pay

## 2020-03-02 ENCOUNTER — Ambulatory Visit (INDEPENDENT_AMBULATORY_CARE_PROVIDER_SITE_OTHER): Payer: 59 | Admitting: Cardiovascular Disease

## 2020-03-02 ENCOUNTER — Encounter: Payer: Self-pay | Admitting: Cardiovascular Disease

## 2020-03-02 VITALS — BP 124/80 | HR 77 | Temp 97.0°F | Ht 64.0 in | Wt 185.0 lb

## 2020-03-02 DIAGNOSIS — I4819 Other persistent atrial fibrillation: Secondary | ICD-10-CM

## 2020-03-02 DIAGNOSIS — Z01812 Encounter for preprocedural laboratory examination: Secondary | ICD-10-CM

## 2020-03-02 DIAGNOSIS — E669 Obesity, unspecified: Secondary | ICD-10-CM

## 2020-03-02 MED ORDER — APIXABAN 5 MG PO TABS: 5.0000 mg | ORAL_TABLET | Freq: Two times a day (BID) | ORAL | 2 refills | Status: DC

## 2020-03-02 MED ORDER — AMIODARONE HCL 200 MG PO TABS: 200.0000 mg | ORAL_TABLET | Freq: Every day | ORAL | 1 refills | Status: DC

## 2020-03-02 NOTE — Patient Instructions (Signed)
Medication Instructions:  The current medical regimen is effective;  continue present plan and medications.  *If you need a refill on your cardiac medications before your next appointment, please call your pharmacy*   Follow-Up: At [facility], you and your health needs are our priority.  As part of our continuing mission to provide you with exceptional heart care, we have created designated Provider Care Teams.  These Care Teams include your primary Cardiologist (physician) and Advanced Practice Providers (APPs -  Physician Assistants and Nurse Practitioners) who all work together to provide you with the care you need, when you need it.  We recommend signing up for the patient portal called "MyChart".  Sign up information is provided on this After Visit Summary.  MyChart is used to connect with patients for Virtual Visits (Telemedicine).  Patients are able to view lab/test results, encounter notes, upcoming appointments, etc.  Non-urgent messages can be sent to your provider as well.   To learn more about what you can do with MyChart, go to [email or web address].    Your next appointment:   6 month(s)  The format for your next appointment:   In Person  Provider:   [place] O'Neal, MD      

## 2020-03-03 LAB — BASIC METABOLIC PANEL
BUN/Creatinine Ratio: 18 (ref 9–23)
BUN: 12 mg/dL (ref 6–24)
CO2: 24 mmol/L (ref 20–29)
Calcium: 9.6 mg/dL (ref 8.7–10.2)
Chloride: 105 mmol/L (ref 96–106)
Creatinine, Ser: 0.66 mg/dL (ref 0.57–1.00)
GFR calc Af Amer: 119 mL/min/{1.73_m2} (ref >59)
GFR calc non Af Amer: 103 mL/min/{1.73_m2} (ref >59)
Glucose: 83 mg/dL (ref 65–99)
Potassium: 4.2 mmol/L (ref 3.5–5.2)
Sodium: 141 mmol/L (ref 134–144)

## 2020-03-03 LAB — CBC
Hematocrit: 40.4 % (ref 34.0–46.6)
Hemoglobin: 13.5 g/dL (ref 11.1–15.9)
MCH: 31.5 pg (ref 26.6–33.0)
MCHC: 33.4 g/dL (ref 31.5–35.7)
MCV: 94 fL (ref 79–97)
Platelets: 251 10*3/uL (ref 150–450)
RBC: 4.29 x10E6/uL (ref 3.77–5.28)
RDW: 13.7 % (ref 11.7–15.4)
WBC: 6.9 10*3/uL (ref 3.4–10.8)

## 2020-03-14 NOTE — Addendum Note (Signed)
Addended by: Freada Bergeron on: 03/14/2020 12:59 PM   Modules accepted: Orders

## 2020-03-14 NOTE — Telephone Encounter (Signed)
Patient is scheduled for lab study on 05/11/20. pt is scheduled for COVID screening on 8/9 3 pm prior to ss.  Patient understands her sleep study will be done at [facility] sleep lab. Patient understands she will receive a sleep packet in a week or so. Patient understands to call if she does not receive the sleep packet in a timely manner. Patient agrees with treatment and thanked me for call.

## 2020-03-15 ENCOUNTER — Telehealth: Payer: Self-pay | Admitting: Cardiology

## 2020-03-15 NOTE — Telephone Encounter (Signed)
Accessed patient's chart to contact Romie Minus for the patient's ablation order number for insurance purposes.

## 2020-03-18 ENCOUNTER — Telehealth (HOSPITAL_COMMUNITY): Payer: Self-pay | Admitting: *Deleted

## 2020-03-18 NOTE — Telephone Encounter (Signed)

## 2020-03-22 ENCOUNTER — Ambulatory Visit (HOSPITAL_COMMUNITY)
Admission: RE | Admit: 2020-03-22 | Discharge: 2020-03-22 | Disposition: A | Discharge status: Home / Self Care | Payer: 59 | Source: Ambulatory Visit | Attending: Cardiology | Admitting: Cardiology

## 2020-03-22 ENCOUNTER — Other Ambulatory Visit: Payer: Self-pay

## 2020-03-22 DIAGNOSIS — I4891 Unspecified atrial fibrillation: Secondary | ICD-10-CM | POA: Diagnosis present

## 2020-03-22 MED ORDER — NITROGLYCERIN 0.4 MG SL SUBL
0.8000 mg | SUBLINGUAL_TABLET | Freq: Once | SUBLINGUAL | Status: AC
  Administered 2020-03-22: 0.8 mg via SUBLINGUAL

## 2020-03-22 MED ORDER — NITROGLYCERIN 0.4 MG SL SUBL
SUBLINGUAL_TABLET | SUBLINGUAL | Status: AC
  Filled 2020-03-22: qty 2

## 2020-03-22 MED ORDER — IOHEXOL 350 MG/ML SOLN
80.0000 mL | Freq: Once | INTRAVENOUS | Status: AC | PRN
  Administered 2020-03-22: 80 mL via INTRAVENOUS

## 2020-03-23 ENCOUNTER — Other Ambulatory Visit (HOSPITAL_COMMUNITY)
Admission: RE | Admit: 2020-03-23 | Discharge: 2020-03-23 | Disposition: A | Discharge status: Home / Self Care | Payer: 59 | Source: Ambulatory Visit | Attending: Cardiology | Admitting: Cardiology

## 2020-03-23 DIAGNOSIS — Z01812 Encounter for preprocedural laboratory examination: Secondary | ICD-10-CM | POA: Insufficient documentation

## 2020-03-23 DIAGNOSIS — Z20822 Contact with and (suspected) exposure to covid-19: Secondary | ICD-10-CM | POA: Diagnosis not present

## 2020-03-23 LAB — SARS CORONAVIRUS 2 (TAT 6-24 HRS): SARS Coronavirus 2: NEGATIVE

## 2020-03-25 ENCOUNTER — Ambulatory Visit (HOSPITAL_COMMUNITY): Payer: 59

## 2020-03-25 ENCOUNTER — Encounter (HOSPITAL_COMMUNITY)
Admission: RE | Disposition: A | Discharge status: Home / Self Care | Payer: Self-pay | Source: Home / Self Care | Attending: Cardiology

## 2020-03-25 ENCOUNTER — Encounter (HOSPITAL_COMMUNITY): Payer: Self-pay | Admitting: Cardiology

## 2020-03-25 ENCOUNTER — Other Ambulatory Visit: Payer: Self-pay

## 2020-03-25 ENCOUNTER — Ambulatory Visit (HOSPITAL_COMMUNITY)
Admission: RE | Admit: 2020-03-25 | Discharge: 2020-03-25 | Disposition: A | Discharge status: Home / Self Care | Payer: 59 | Attending: Cardiology | Admitting: Cardiology

## 2020-03-25 DIAGNOSIS — I480 Paroxysmal atrial fibrillation: Secondary | ICD-10-CM | POA: Insufficient documentation

## 2020-03-25 DIAGNOSIS — Z6831 Body mass index (BMI) 31.0-31.9, adult: Secondary | ICD-10-CM | POA: Diagnosis not present

## 2020-03-25 DIAGNOSIS — Z79899 Other long term (current) drug therapy: Secondary | ICD-10-CM | POA: Diagnosis not present

## 2020-03-25 DIAGNOSIS — Z87891 Personal history of nicotine dependence: Secondary | ICD-10-CM | POA: Insufficient documentation

## 2020-03-25 DIAGNOSIS — E669 Obesity, unspecified: Secondary | ICD-10-CM | POA: Diagnosis not present

## 2020-03-25 DIAGNOSIS — Z7901 Long term (current) use of anticoagulants: Secondary | ICD-10-CM | POA: Diagnosis not present

## 2020-03-25 DIAGNOSIS — Z880 Allergy status to penicillin: Secondary | ICD-10-CM | POA: Insufficient documentation

## 2020-03-25 DIAGNOSIS — I4891 Unspecified atrial fibrillation: Secondary | ICD-10-CM

## 2020-03-25 HISTORY — DX: Unspecified atrial fibrillation: I48.91

## 2020-03-25 HISTORY — PX: ATRIAL FIBRILLATION ABLATION: EP1191

## 2020-03-25 HISTORY — PX: ABLATION: SHX5711

## 2020-03-25 LAB — POCT ACTIVATED CLOTTING TIME
Activated Clotting Time: 285 seconds
Activated Clotting Time: 384 seconds
Activated Clotting Time: 340 seconds
Activated Clotting Time: 345 seconds
Activated Clotting Time: 334 seconds

## 2020-03-25 LAB — PREGNANCY, URINE: Preg Test, Ur: NEGATIVE

## 2020-03-25 SURGERY — ATRIAL FIBRILLATION ABLATION
Anesthesia: General

## 2020-03-25 MED ORDER — HEPARIN SODIUM (PORCINE) 1000 UNIT/ML IJ SOLN
INTRAMUSCULAR | Status: DC | PRN
Start: 2020-03-25 — End: 2020-03-25
  Administered 2020-03-25: 14000 [IU] via INTRAVENOUS
  Administered 2020-03-25: 1000 [IU] via INTRAVENOUS
  Administered 2020-03-25: 4000 [IU] via INTRAVENOUS
  Administered 2020-03-25: 1000 [IU] via INTRAVENOUS

## 2020-03-25 MED ORDER — HEPARIN SODIUM (PORCINE) 1000 UNIT/ML IJ SOLN
INTRAMUSCULAR | Status: DC | PRN
  Administered 2020-03-25: 1000 [IU] via INTRAVENOUS

## 2020-03-25 MED ORDER — ROCURONIUM BROMIDE 10 MG/ML (PF) SYRINGE
PREFILLED_SYRINGE | INTRAVENOUS | Status: DC | PRN
  Administered 2020-03-25: 60 mg via INTRAVENOUS

## 2020-03-25 MED ORDER — HEPARIN (PORCINE) IN NACL 1000-0.9 UT/500ML-% IV SOLN
INTRAVENOUS | Status: AC
  Filled 2020-03-25: qty 500

## 2020-03-25 MED ORDER — HEPARIN (PORCINE) IN NACL 1000-0.9 UT/500ML-% IV SOLN
INTRAVENOUS | Status: DC | PRN
  Administered 2020-03-25 (×5): 500 mL

## 2020-03-25 MED ORDER — LIDOCAINE 2% (20 MG/ML) 5 ML SYRINGE
INTRAMUSCULAR | Status: DC | PRN
  Administered 2020-03-25: 40 mg via INTRAVENOUS

## 2020-03-25 MED ORDER — PROPOFOL 10 MG/ML IV BOLUS
INTRAVENOUS | Status: DC | PRN
  Administered 2020-03-25: 150 mg via INTRAVENOUS
  Administered 2020-03-25: 50 mg via INTRAVENOUS

## 2020-03-25 MED ORDER — ONDANSETRON HCL 4 MG/2ML IJ SOLN: 4.0000 mg | Freq: Four times a day (QID) | INTRAMUSCULAR | Status: DC | PRN

## 2020-03-25 MED ORDER — PHENYLEPHRINE HCL-NACL 10-0.9 MG/250ML-% IV SOLN
INTRAVENOUS | Status: DC | PRN
Start: 2020-03-25 — End: 2020-03-25
  Administered 2020-03-25: 20 ug/min via INTRAVENOUS

## 2020-03-25 MED ORDER — PROTAMINE SULFATE 10 MG/ML IV SOLN
INTRAVENOUS | Status: DC | PRN
  Administered 2020-03-25 (×3): 10 mg via INTRAVENOUS
  Administered 2020-03-25 (×2): 15 mg via INTRAVENOUS
  Administered 2020-03-25: 10 mg via INTRAVENOUS

## 2020-03-25 MED ORDER — MIDAZOLAM HCL 5 MG/5ML IJ SOLN
INTRAMUSCULAR | Status: DC | PRN
  Administered 2020-03-25: 2 mg via INTRAVENOUS

## 2020-03-25 MED ORDER — SODIUM CHLORIDE 0.9 % IV SOLN: INTRAVENOUS | Status: DC

## 2020-03-25 MED ORDER — FENTANYL CITRATE (PF) 100 MCG/2ML IJ SOLN
INTRAMUSCULAR | Status: DC | PRN
  Administered 2020-03-25 (×2): 50 ug via INTRAVENOUS

## 2020-03-25 MED ORDER — ONDANSETRON HCL 4 MG/2ML IJ SOLN
INTRAMUSCULAR | Status: DC | PRN
  Administered 2020-03-25: 4 mg via INTRAVENOUS

## 2020-03-25 MED ORDER — DEXAMETHASONE SODIUM PHOSPHATE 10 MG/ML IJ SOLN
INTRAMUSCULAR | Status: DC | PRN
Start: 2020-03-25 — End: 2020-03-25
  Administered 2020-03-25: 5 mg via INTRAVENOUS

## 2020-03-25 MED ORDER — SUGAMMADEX SODIUM 200 MG/2ML IV SOLN
INTRAVENOUS | Status: DC | PRN
Start: 2020-03-25 — End: 2020-03-25
  Administered 2020-03-25: 200 mg via INTRAVENOUS

## 2020-03-25 MED ORDER — HEPARIN SODIUM (PORCINE) 1000 UNIT/ML IJ SOLN
INTRAMUSCULAR | Status: AC
  Filled 2020-03-25: qty 1

## 2020-03-25 SURGICAL SUPPLY — 22 items
BLANKET WARM UNDERBOD FULL ACC (MISCELLANEOUS) ×3 IMPLANT
CATH MAPPNG PENTARAY F 2-6-2MM (CATHETERS) IMPLANT
CATH SMTCH THERMOCOOL SF DF (CATHETERS) ×2 IMPLANT
CATH SOUNDSTAR ECO 8FR (CATHETERS) ×2 IMPLANT
CATH WEBSTER BI DIR CS D-F CRV (CATHETERS) ×2 IMPLANT
COVER SWIFTLINK CONNECTOR (BAG) ×3 IMPLANT
DEVICE CLOSURE PERCLS PRGLD 6F (VASCULAR PRODUCTS) IMPLANT
PACK EP LATEX FREE (CUSTOM PROCEDURE TRAY) ×3
PACK EP LF (CUSTOM PROCEDURE TRAY) ×1 IMPLANT
PAD PRO RADIOLUCENT 2001M-C (PAD) ×3 IMPLANT
PATCH CARTO3 (PAD) ×2 IMPLANT
PENTARAY F 2-6-2MM (CATHETERS) ×3
PERCLOSE PROGLIDE 6F (VASCULAR PRODUCTS) ×12
SHEATH BAYLIS SUREFLEX  M 8.5 (SHEATH) ×3
SHEATH BAYLIS SUREFLEX M 8.5 (SHEATH) IMPLANT
SHEATH BAYLIS TRANSSEPTAL 98CM (NEEDLE) ×2 IMPLANT
SHEATH CARTO VIZIGO SM CVD (SHEATH) ×2 IMPLANT
SHEATH PINNACLE 7F 10CM (SHEATH) ×2 IMPLANT
SHEATH PINNACLE 8F 10CM (SHEATH) ×4 IMPLANT
SHEATH PINNACLE 9F 10CM (SHEATH) ×2 IMPLANT
SHEATH PROBE COVER 6X72 (BAG) ×2 IMPLANT
TUBING SMART ABLATE COOLFLOW (TUBING) ×2 IMPLANT

## 2020-03-25 NOTE — Anesthesia Procedure Notes (Signed)
Procedure Name: Intubation Date/Time: 03/25/2020 11:32 AM Performed by: Candis Shine, CRNA Pre-anesthesia Checklist: Patient identified, Emergency Drugs available, Suction available and Patient being monitored Patient Re-evaluated:Patient Re-evaluated prior to induction Oxygen Delivery Method: Circle System Utilized Preoxygenation: Pre-oxygenation with 100% oxygen Induction Type: IV induction Ventilation: Mask ventilation without difficulty Laryngoscope Size: Mac and 3 Grade View: Grade I Tube type: Oral Number of attempts: 1 Airway Equipment and Method: Stylet and Oral airway Placement Confirmation: ETT inserted through vocal cords under direct vision,  positive ETCO2 and breath sounds checked- equal and bilateral Secured at: 22 cm Tube secured with: Tape Dental Injury: Teeth and Oropharynx as per pre-operative assessment  Comments: Placed by D. Winche[place], [place]

## 2020-03-25 NOTE — Anesthesia Postprocedure Evaluation (Signed)
Anesthesia Post Note  Patient: Cindy Christian  Procedure(s) Performed: ATRIAL FIBRILLATION ABLATION (N/A )     Patient location during evaluation: Phase II Anesthesia Type: General Level of consciousness: awake and alert, oriented and patient cooperative Pain management: pain level controlled Vital Signs Assessment: post-procedure vital signs reviewed and stable Respiratory status: spontaneous breathing, nonlabored ventilation and respiratory function stable Cardiovascular status: blood pressure returned to baseline and stable Postop Assessment: no apparent nausea or vomiting and adequate PO intake Anesthetic complications: no   No complications documented.  Last Vitals:  Vitals:   03/25/20 1653 03/25/20 1654  BP: 113/74   Pulse: 72 75  Resp:  12  Temp:    SpO2: 99% 100%    Last Pain:  Vitals:   03/25/20 1638  TempSrc:   PainSc: 0-No pain                 Elizar Alpern,E. Jaskirat Zertuche

## 2020-03-25 NOTE — H&P (Signed)
Cindy Christian has presented today for surgery, with the diagnosis of atrial fibrillation.  The various methods of treatment have been discussed with the patient and family. After consideration of risks, benefits and other options for treatment, the patient has consented to  Procedure(s): Catheter ablation as a surgical intervention .  Risks include but not limited to bleeding, tamponade, heart block, stroke, damage to surrounding organs, among others. The patient's history has been reviewed, patient examined, no change in status, stable for surgery.  I have reviewed the patient's chart and labs.  Questions were answered to the patient's satisfaction.    Siearra Amberg Curt Bears, MD 03/25/2020 10:37 AM

## 2020-03-25 NOTE — Transfer of Care (Signed)
Immediate Anesthesia Transfer of Care Note  Patient: Cindy Christian  Procedure(s) Performed: ATRIAL FIBRILLATION ABLATION (N/A )  Patient Location: Cath Lab  Anesthesia Type:General  Level of Consciousness: awake, alert  and oriented  Airway & Oxygen Therapy: Patient Spontanous Breathing  Post-op Assessment: Report given to RN and Post -op Vital signs reviewed and stable  Post vital signs: Reviewed and stable  Last Vitals:  Vitals Value Taken Time  BP 123/88 03/25/20 1519  Temp    Pulse 94 03/25/20 1522  Resp 15 03/25/20 1522  SpO2 99 % 03/25/20 1522  Vitals shown include unvalidated device data.  Last Pain:  Vitals:   03/25/20 0924  TempSrc:   PainSc: 0-No pain         Complications: No complications documented.

## 2020-03-25 NOTE — Anesthesia Preprocedure Evaluation (Signed)
Anesthesia Evaluation  Patient identified by MRN, date of birth, ID band Patient awake    Reviewed: Allergy & Precautions, NPO status , Patient's Chart, lab work & pertinent test results, reviewed documented beta blocker date and time   Airway Mallampati: II  TM Distance: >3 FB Neck ROM: Full    Dental no notable dental hx. (+) Teeth Intact   Pulmonary former smoker,    Pulmonary exam normal breath sounds clear to auscultation       Cardiovascular Normal cardiovascular exam+ dysrhythmias Atrial Fibrillation  Rhythm:Regular Rate:Normal     Neuro/Psych negative neurological ROS  negative psych ROS   GI/Hepatic negative GI ROS, Neg liver ROS,   Endo/Other  Obesity  Renal/GU negative Renal ROS  negative genitourinary   Musculoskeletal negative musculoskeletal ROS (+)   Abdominal (+) + obese,   Peds  Hematology Xarelto therapy- last dose    Anesthesia Other Findings   Reproductive/Obstetrics                             Anesthesia Physical Anesthesia Plan  ASA: II  Anesthesia Plan: General   Post-op Pain Management:    Induction: Intravenous  PONV Risk Score and Plan: 3 and Midazolam, Ondansetron and Treatment may vary due to age or medical condition  Airway Management Planned: Oral ETT  Additional Equipment:   Intra-op Plan:   Post-operative Plan: Extubation in OR  Informed Consent: I have reviewed the patients History and Physical, chart, labs and discussed the procedure including the risks, benefits and alternatives for the proposed anesthesia with the patient or authorized representative who has indicated his/her understanding and acceptance.     Dental advisory given  Plan Discussed with: CRNA  Anesthesia Plan Comments:         Anesthesia Quick Evaluation

## 2020-03-25 NOTE — Discharge Instructions (Signed)

## 2020-03-28 ENCOUNTER — Encounter (HOSPITAL_COMMUNITY): Payer: Self-pay | Admitting: Cardiology

## 2020-03-29 ENCOUNTER — Other Ambulatory Visit (HOSPITAL_COMMUNITY): Payer: 59

## 2020-03-31 ENCOUNTER — Encounter (HOSPITAL_BASED_OUTPATIENT_CLINIC_OR_DEPARTMENT_OTHER): Payer: 59 | Admitting: Cardiology

## 2020-04-22 ENCOUNTER — Other Ambulatory Visit: Payer: Self-pay

## 2020-04-22 ENCOUNTER — Encounter (HOSPITAL_COMMUNITY): Payer: Self-pay | Admitting: Physician Assistant

## 2020-04-22 ENCOUNTER — Ambulatory Visit (HOSPITAL_COMMUNITY)
Admission: RE | Admit: 2020-04-22 | Discharge: 2020-04-22 | Disposition: A | Discharge status: Home / Self Care | Payer: 59 | Source: Ambulatory Visit | Attending: Physician Assistant | Admitting: Physician Assistant

## 2020-04-22 VITALS — BP 112/60 | HR 63 | Ht 64.0 in | Wt 184.8 lb

## 2020-04-22 DIAGNOSIS — G4733 Obstructive sleep apnea (adult) (pediatric): Secondary | ICD-10-CM | POA: Diagnosis not present

## 2020-04-22 DIAGNOSIS — Z713 Dietary counseling and surveillance: Secondary | ICD-10-CM | POA: Insufficient documentation

## 2020-04-22 DIAGNOSIS — Z6831 Body mass index (BMI) 31.0-31.9, adult: Secondary | ICD-10-CM | POA: Diagnosis not present

## 2020-04-22 DIAGNOSIS — Z7901 Long term (current) use of anticoagulants: Secondary | ICD-10-CM | POA: Insufficient documentation

## 2020-04-22 DIAGNOSIS — I4819 Other persistent atrial fibrillation: Secondary | ICD-10-CM | POA: Insufficient documentation

## 2020-04-22 DIAGNOSIS — E669 Obesity, unspecified: Secondary | ICD-10-CM | POA: Insufficient documentation

## 2020-04-22 NOTE — Progress Notes (Signed)
Primary Care Physician: Carollee Herter, Alferd Apa, DO Primary Cardiologist: Dr Audie Box Primary Electrophysiologist: Dr Curt Bears Referring Physician: Dr Suzan Nailer [name] is a 49 y.o. female with a history of persistent atrial fibrillation who presents for follow up in the [facility]. She presented to [facility] 12/26/2019 with 5 weeks of shortness of breath, abdominal bloating, and palpitations.  She was found to be in rapid atrial fibrillation.  She had a cardioversion but did not go back into sinus rhythm.  She was loaded on flecainide with repeat cardioversion getting her back into sinus rhythm.  She presented back to cardiology clinic and was found to be back in atrial fibrillation and has since been loaded on amiodarone. Patient is on Eliquis for a CHADS2VASC score of 1.  On follow up today, patient underwent afib ablation with Dr Curt Bears on 03/25/20. She reports that she has not had any heart racing or palpitations. She denies CP, swallowing, or groin issues. She denies bleeding issues on anticoagulation.   Today, she denies symptoms of palpitations, chest pain, shortness of breath, orthopnea, PND, lower extremity edema, dizziness, presyncope, syncope, snoring, daytime somnolence, bleeding, or neurologic sequela. The patient is tolerating medications without difficulties and is otherwise without complaint today.    Atrial Fibrillation Risk Factors:  she does have symptoms or diagnosis of sleep apnea. Sleep study is pending. she does not have a history of rheumatic fever. she does not have a history of alcohol use.   she has a BMI of Body mass index is 31.72 kg/m.Cindy Christian Filed Weights   04/22/20 0935  Weight: 83.8 kg    Family History  Problem Relation Age of Onset   Diabetes Maternal Uncle        Type 1   Stroke Maternal Grandmother      Atrial Fibrillation Management history:  Previous antiarrhythmic drugs: flecainide,  amiodarone Previous cardioversions: 12/30/19 Previous ablations: 03/25/20 CHADS2VASC score: 1 Anticoagulation history: Eliquis   Past Medical History:  Diagnosis Date   Borderline glaucoma    popa   Past Surgical History:  Procedure Laterality Date   ATRIAL FIBRILLATION ABLATION N/A 03/25/2020   Procedure: ATRIAL FIBRILLATION ABLATION;  Surgeon: Constance Haw, MD;  Location: [facility];  Service: Cardiovascular;  Laterality: N/A;   BUBBLE STUDY  12/30/2019   Procedure: BUBBLE STUDY;  Surgeon: Elouise Munroe, MD;  Location: [facility];  Service: Cardiovascular;;   CARDIOVERSION N/A 12/30/2019   Procedure: CARDIOVERSION;  Surgeon: Elouise Munroe, MD;  Location: [facility]COPY;  Service: Cardiovascular;  Laterality: N/A;   FINGER FRACTURE SURGERY Left 1983   middle finger   KNEE ARTHROPLASTY Bilateral 2000 R 2008 L   LEEP  2004   CIN 1-2   TEE WITHOUT CARDIOVERSION N/A 12/30/2019   Procedure: TRANSESOPHAGEAL ECHOCARDIOGRAM (TEE);  Surgeon: Elouise Munroe, MD;  Location: [facility] ENDOSCOPY;  Service: Cardiovascular;  Laterality: N/A;    Current Outpatient Medications  Medication Sig Dispense Refill   amiodarone (PACERONE) 200 MG tablet Take 1 tablet (200 mg total) by mouth daily. 90 tablet 1   apixaban (ELIQUIS) 5 MG TABS tablet Take 1 tablet (5 mg total) by mouth 2 (two) times daily. 180 tablet 2   ascorbic acid (VITAMIN C) 500 MG tablet Take 500 mg by mouth 2 (two) times daily.     b complex vitamins tablet Take 1 tablet by mouth daily.     magnesium 30 MG tablet Take 30 mg by mouth  2 (two) times daily.     metoprolol succinate (TOPROL-XL) 50 MG 24 hr tablet Take 50 mg by mouth daily. Take with or immediately following a meal.     OVER THE COUNTER MEDICATION Place 2 drops into both eyes daily. Simalasin Allergy Eye Relief - homeopathic      Potassium 75 MG TABS Take by mouth.     Zinc 50 MG CAPS Take 50 mg by mouth daily.     No current  facility-administered medications for this encounter.    Allergies  Allergen Reactions   Penicillins Other (See Comments)    Childhood allergy Did it involve swelling of the face/tongue/throat, SOB, or low BP? Unknown Did it involve sudden or severe rash/hives, skin peeling, or any reaction on the inside of your mouth or nose? Unknown Did you need to seek medical attention at a hospital or doctor's office? Unknown When did it last happen?young child If all above answers are NO, may proceed with cephalosporin use.    Social History   Socioeconomic History   Marital status: Single    Spouse name: Not on file   Number of children: Not on file   Years of education: Not on file   Highest education level: Not on file  Occupational History   Occupation: admin    Comment: Psychologist, counselling  Tobacco Use   Smoking status: Former Smoker    Packs/day: 0.50    Years: 13.00    Pack years: 6.50    Quit date: 04/24/1998    Years since quitting: 22.0   Smokeless tobacco: Never Used  Vaping Use   Vaping Use: Never used  Substance and Sexual Activity   Alcohol use: Not Currently    Alcohol/week: 16.0 standard drinks    Types: 4 Glasses of wine, 12 Cans of beer per week   Drug use: No   Sexual activity: Yes    Partners: Male  Other Topics Concern   Not on file  Social History Narrative   Walking daily   Social Determinants of Health   Financial Resource Strain:    Difficulty of Paying Living Expenses:   Food Insecurity:    Worried About Charity fundraiser in the Last Year:    Arboriculturist in the Last Year:   Transportation Needs:    Film/video editor (Medical):    Lack of Transportation (Non-Medical):   Physical Activity:    Days of Exercise per Week:    Minutes of Exercise per Session:   Stress:    Feeling of Stress :   Social Connections:    Frequency of Communication with Friends and Family:    Frequency of Social Gatherings  with Friends and Family:    Attends Religious Services:    Active Member of Clubs or Organizations:    Attends Music therapist:    Marital Status:   Intimate Partner Violence:    Fear of Current or Ex-Partner:    Emotionally Abused:    Physically Abused:    Sexually Abused:      ROS- All systems are reviewed and negative except as per the HPI above.  Physical Exam: Vitals:   04/22/20 0935  BP: (!) 112/60  Pulse: 63  Weight: 83.8 kg  Height: 5\' 4"  (1.626 m)    GEN- The patient is well appearing obese female, alert and oriented x 3 today.   HEENT-head normocephalic, atraumatic, sclera clear, conjunctiva pink, hearing intact, trachea midline. Lungs- Clear to ausculation  bilaterally, normal work of breathing Heart- Regular rate and rhythm, no murmurs, rubs or gallops  GI- soft, NT, ND, + BS Extremities- no clubbing, cyanosis, or edema MS- no significant deformity or atrophy Skin- no rash or lesion Psych- euthymic mood, full affect Neuro- strength and sensation are intact   Wt Readings from Last 3 Encounters:  04/22/20 83.8 kg  03/25/20 83 kg  03/02/20 83.9 kg    EKG today demonstrates SR HR 63, inc RBBB, PR 146, QRS 94, QTc 437  Echo 12/28/19 demonstrated  1. Technically difficult study due to atrial fibrillation and poor  windows. Grossly normal LV systolic function. Left ventricular ejection  fraction, by estimation, is 55 to 60%. The left ventricle has normal  function. The left ventricle has no regional  wall motion abnormalities. There is mild left ventricular hypertrophy.  Left ventricular diastolic parameters are indeterminate.  2. Right ventricular systolic function is normal. The right ventricular  size is mildly enlarged.  3. Right atrial size was mildly dilated.  4. The mitral valve is normal in structure. Mild mitral valve  regurgitation.  5. The aortic valve was not well visualized. Aortic valve regurgitation  is not  visualized. AV gradients were not measured   Epic records are reviewed at length today  CHA2DS2-VASc Score = 1  The patient's score is based upon: CHF History: 0 HTN History: 0 Age : 0 Diabetes History: 0 Stroke History: 0 Vascular Disease History: 0 Gender: 1      ASSESSMENT AND PLAN: 1. Persistent Atrial Fibrillation (ICD10:  I48.19) The patient's CHA2DS2-VASc score is 1, indicating a 0.6% annual risk of stroke.   S/p afib ablation 03/25/20 Patient appears to be maintaining SR Continue amiodarone 200 mg daily, anticipate this will be discontinued at follow up. Continue Eliquis 5 mg BID for at least 3 months post ablation with no missed doses. May also consider stopping this given her low CHADS2VASC score. Continue Toprol 50 mg daily   2. Obesity Body mass index is 31.72 kg/m. Lifestyle modification was discussed at length including regular exercise and weight reduction. Patient has lost 6 lbs.  3. Sleep disordered breathing Sleep study pending.   Follow up with Dr Curt Bears as scheduled.    [facility] [address] [place], [place] [address] [phone] 04/22/2020 11:44 AM

## 2020-05-09 ENCOUNTER — Other Ambulatory Visit (HOSPITAL_COMMUNITY): Payer: 59

## 2020-05-11 ENCOUNTER — Ambulatory Visit (HOSPITAL_BASED_OUTPATIENT_CLINIC_OR_DEPARTMENT_OTHER): Payer: 59 | Attending: Cardiovascular Disease | Admitting: Cardiology

## 2020-05-11 ENCOUNTER — Other Ambulatory Visit: Payer: Self-pay

## 2020-05-11 DIAGNOSIS — R400 Somnolence: Secondary | ICD-10-CM

## 2020-05-11 DIAGNOSIS — Z7901 Long term (current) use of anticoagulants: Secondary | ICD-10-CM | POA: Diagnosis not present

## 2020-05-11 DIAGNOSIS — R0683 Snoring: Secondary | ICD-10-CM | POA: Diagnosis not present

## 2020-05-12 NOTE — Procedures (Signed)
   Patient Name: Cindy Christian, Cindy Christian Date:05/11/2020 Gender: Female D.O.B: 1971-04-17 Age (years): 46 Referring Provider: Cassie Freer ONeal Height (inches): 67 Interpreting Physician: Fransico Him MD, ABSM Weight (lbs): 180 RPSGT: Zadie Rhine BMI: 31 MRN: [id number] Neck Size: 15.50  CLINICAL INFORMATION Sleep Study Type: NPSG  Indication for sleep study: Excessive Daytime Sleepiness, Fatigue  Epworth Sleepiness Score: 8  SLEEP STUDY TECHNIQUE As per the AASM Manual for the Scoring of Sleep and Associated Events v2.3 (April 2016) with a hypopnea requiring 4% desaturations.  The channels recorded and monitored were frontal, central and occipital EEG, electrooculogram (EOG), submentalis EMG (chin), nasal and oral airflow, thoracic and abdominal wall motion, anterior tibialis EMG, snore microphone, electrocardiogram, and pulse oximetry.  MEDICATIONS Medications self-administered by patient taken the night of the study : ELIQUIS, [place] The study was initiated at 10:26:49 PM and ended at 4:25:32 AM.  Sleep onset time was 53.4 minutes and the sleep efficiency was 76.8%. The total sleep time was 275.3 minutes.  Stage REM latency was 128.5 minutes.  The patient spent 4.5% of the night in stage N1 sleep, 59.0% in stage N2 sleep, 16.7% in stage N3 and 19.8% in REM.  Alpha intrusion was absent.  Supine sleep was 26.88%.  RESPIRATORY PARAMETERS The overall apnea/hypopnea index (AHI) was 4.1 per hour. There were 1 total apneas, including 1 obstructive, 0 central and 0 mixed apneas. There were 18 hypopneas and 11 RERAs.  The AHI during Stage REM sleep was 14.3 per hour.  AHI while supine was 8.9 per hour.  The mean oxygen saturation was 93.8%. The minimum SpO2 during sleep was 85.0%.  moderate snoring was noted during this study.  CARDIAC DATA The 2 lead EKG demonstrated sinus rhythm. The mean heart rate was 53.5 beats per minute. Other EKG findings  include: None.  LEG MOVEMENT DATA The total PLMS were 0 with a resulting PLMS index of 0.0. Associated arousal with leg movement index was 1.3 .  IMPRESSIONS - No significant obstructive sleep apnea occurred during this study (AHI = 4.1/h). - No significant central sleep apnea occurred during this study (CAI = 0.0/h). - Mild oxygen desaturation was noted during this study (Min O2 = 85.0%). - The patient snored with moderate snoring volume. - No cardiac abnormalities were noted during this study. - Clinically significant periodic limb movements did not occur during sleep. No significant associated arousals.  DIAGNOSIS - Normal Study  RECOMMENDATIONS - Avoid alcohol, sedatives and other CNS depressants that may worsen sleep apnea and disrupt normal sleep architecture. - Sleep hygiene should be reviewed to assess factors that may improve sleep quality. - Weight management and regular exercise should be initiated or continued if appropriate.  [Electronically signed] 05/12/2020 11:11 AM  Fransico Him MD, ABSM Diplomate, American Board of Sleep Medicine

## 2020-05-13 ENCOUNTER — Telehealth: Payer: Self-pay | Admitting: *Deleted

## 2020-05-13 NOTE — Telephone Encounter (Signed)
Informed patient of sleep study results and patient understanding was verbalized. Patient understands her sleep study showed no significant sleep apnea.   Pt is aware and agreeable to normal results.  

## 2020-05-13 NOTE — Telephone Encounter (Signed)
-----   Message from Sueanne Margarita, MD sent at 05/12/2020 11:13 AM EDT ----- Please let patient know that sleep study showed no significant sleep apnea.

## 2020-06-02 ENCOUNTER — Telehealth: Payer: Self-pay | Admitting: Family Medicine

## 2020-06-02 ENCOUNTER — Encounter: Payer: Self-pay | Admitting: Gastroenterology

## 2020-06-02 NOTE — Telephone Encounter (Signed)
Patient is calling and requesting a TOC from Dr. Etter Sjogren to Dr. Bryan Lemma. Is this ok?

## 2020-06-02 NOTE — Telephone Encounter (Signed)
Ok with me 

## 2020-06-28 ENCOUNTER — Encounter: Payer: Self-pay | Admitting: Cardiology

## 2020-06-28 ENCOUNTER — Ambulatory Visit (INDEPENDENT_AMBULATORY_CARE_PROVIDER_SITE_OTHER): Payer: 59 | Admitting: Cardiology

## 2020-06-28 ENCOUNTER — Other Ambulatory Visit: Payer: Self-pay

## 2020-06-28 VITALS — BP 104/70 | HR 72 | Wt 180.0 lb

## 2020-06-28 DIAGNOSIS — I4819 Other persistent atrial fibrillation: Secondary | ICD-10-CM | POA: Diagnosis not present

## 2020-06-28 NOTE — Patient Instructions (Signed)
Medication Instructions:  Your physician recommends that you continue on your current medications as directed. Please refer to the Current Medication list given to you today.  *If you need a refill on your cardiac medications before your next appointment, please call your pharmacy*   Lab Work: None ordered If you have labs (blood work) drawn today and your tests are completely normal, you will receive your results only by: . MyChart Message (if you have MyChart) OR . A paper copy in the mail If you have any lab test that is abnormal or we need to change your treatment, we will call you to review the results.   Testing/Procedures: None ordered   Follow-Up: At [facility], you and your health needs are our priority.  As part of our continuing mission to provide you with exceptional heart care, we have created designated Provider Care Teams.  These Care Teams include your primary Cardiologist (physician) and Advanced Practice Providers (APPs -  Physician Assistants and Nurse Practitioners) who all work together to provide you with the care you need, when you need it.  We recommend signing up for the patient portal called "MyChart".  Sign up information is provided on this After Visit Summary.  MyChart is used to connect with patients for Virtual Visits (Telemedicine).  Patients are able to view lab/test results, encounter notes, upcoming appointments, etc.  Non-urgent messages can be sent to your provider as well.   To learn more about what you can do with MyChart, go to [email or web address].    Your next appointment:   3 month(s)  The format for your next appointment:   In Person  Provider:   Will [name], MD   Thank you for choosing [facility]!!   Hammond Obeirne, RN [phone]    Other Instructions    

## 2020-06-28 NOTE — Progress Notes (Signed)
Electrophysiology Office Note   Date:  06/28/2020   ID:  Cindy Christian, DOB [birth year], MRN [id number]  PCP:  Ronnald Nian, DO  Cardiologist:  M'EQAS Primary Electrophysiologist:  Ercelle Winkles Meredith Leeds, MD    Chief Complaint: AF   History of Present Illness: Cindy Christian is a 49 y.o. female who is being seen today for the evaluation of AF at the request of Ann Held, *. Presenting today for electrophysiology evaluation.  She initially presented to [facility] 12/26/2019 with 5 weeks of shortness of breath, abdominal bloating, and palpitations.  She was found to be in rapid atrial fibrillation.  She had a cardioversion, but quickly went back into atrial fibrillation.  She was loaded on flecainide with repeat cardioversion getting her back into normal rhythm.  She presented back to cardiology clinic and was found to be in rapid atrial fibrillation and was loaded on amiodarone.  She is now status post AF ablation 03/25/2020.   Today, denies symptoms of palpitations, chest pain, shortness of breath, orthopnea, PND, lower extremity edema, claudication, dizziness, presyncope, syncope, bleeding, or neurologic sequela. The patient is tolerating medications without difficulties.  Since last being seen she has done well.  She has had no further episodes of atrial fibrillation.  She continues to feel well.  She continues to exercise and has stopped drinking caffeine and alcohol.  Her goal weight is 150 pounds.   Past Medical History:  Diagnosis Date  . Borderline glaucoma    popa   Past Surgical History:  Procedure Laterality Date  . ATRIAL FIBRILLATION ABLATION N/A 03/25/2020   Procedure: ATRIAL FIBRILLATION ABLATION;  Surgeon: Constance Haw, MD;  Location: [place];  Service: Cardiovascular;  Laterality: N/A;  . BUBBLE STUDY  12/30/2019   Procedure: BUBBLE STUDY;  Surgeon: Elouise Munroe, MD;  Location: [place];  Service: Cardiovascular;;  .  CARDIOVERSION N/A 12/30/2019   Procedure: CARDIOVERSION;  Surgeon: Elouise Munroe, MD;  Location: [facility] ENDOSCOPY;  Service: Cardiovascular;  Laterality: N/A;  . FINGER FRACTURE SURGERY Left 1983   middle finger  . KNEE ARTHROPLASTY Bilateral 2000 R 2008 L  . LEEP  2004   CIN 1-2  . TEE WITHOUT CARDIOVERSION N/A 12/30/2019   Procedure: TRANSESOPHAGEAL ECHOCARDIOGRAM (TEE);  Surgeon: Elouise Munroe, MD;  Location: [facility] END[facility]COPY;  Service: Cardiovascular;  Laterality: N/A;     Current Outpatient Medications  Medication Sig Dispense Refill  . ascorbic acid (VITAMIN C) 500 MG tablet Take 500 mg by mouth 2 (two) times daily.    Marland Kitchen b complex vitamins tablet Take 1 tablet by mouth daily.    . magnesium 30 MG tablet Take 30 mg by mouth 2 (two) times daily.    Marland Kitchen OVER THE COUNTER MEDICATION Place 2 drops into both eyes daily. Simalasin Allergy Eye Relief - homeopathic     . Potassium 75 MG TABS Take by mouth.    . Zinc 50 MG CAPS Take 50 mg by mouth daily.     No current facility-administered medications for this visit.    Allergies:   Penicillins   Social History:  The patient  reports that she quit smoking about 22 years ago. She has a 6.50 pack-year smoking history. She has never used smokeless tobacco. She reports previous alcohol use of about 16.0 standard drinks of alcohol per week. She reports that she does not use drugs.   Family History:  The patient's family history includes Diabetes in her maternal uncle;  Stroke in her maternal grandmother.   ROS:  Please see the history of present illness.   Otherwise, review of systems is positive for none.   All other systems are reviewed and negative.   PHYSICAL EXAM: VS:  BP 104/70   Pulse 72   Wt 180 lb (81.6 kg)   SpO2 99%   BMI 30.90 kg/m  , BMI Body mass index is 30.9 kg/m. GEN: Well nourished, well developed, in no acute distress  HEENT: normal  Neck: no JVD, carotid bruits, or masses Cardiac: RRR; no murmurs, rubs, or  gallops,no edema  Respiratory:  clear to auscultation bilaterally, normal work of breathing GI: soft, nontender, nondistended, + BS MS: no deformity or atrophy  Skin: warm and dry Neuro:  Strength and sensation are intact Psych: euthymic mood, full affect  EKG:  EKG is ordered today. Personal review of the ekg ordered shows sinus rhythm, rate 72  Recent Labs: 12/26/2019: B Natriuretic Peptide 179.7; TSH 5.209 12/31/2019: Magnesium 1.8 02/26/2020: ALT 34 03/02/2020: BUN 12; Creatinine, Ser 0.66; Hemoglobin 13.5; Platelets 251; Potassium 4.2; Sodium 141    Lipid Panel     Component Value Date/Time   CHOL 183 10/29/2019 1151   TRIG 122.0 10/29/2019 1151   HDL 48.60 10/29/2019 1151   CHOLHDL 4 10/29/2019 1151   VLDL 24.4 10/29/2019 1151   LDLCALC 110 (H) 10/29/2019 1151     Wt Readings from Last 3 Encounters:  06/28/20 180 lb (81.6 kg)  05/11/20 180 lb (81.6 kg)  04/22/20 184 lb 12.8 oz (83.8 kg)      Other studies Reviewed: Additional studies/ records that were reviewed today include: TTE 12/28/19  Review of the above records today demonstrates:  1. Technically difficult study due to atrial fibrillation and poor  windows. Grossly normal LV systolic function. Left ventricular ejection  fraction, by estimation, is 55 to 60%. The left ventricle has normal  function. The left ventricle has no regional  wall motion abnormalities. There is mild left ventricular hypertrophy.  Left ventricular diastolic parameters are indeterminate.  2. Right ventricular systolic function is normal. The right ventricular  size is mildly enlarged.  3. Right atrial size was mildly dilated.  4. The mitral valve is normal in structure. Mild mitral valve  regurgitation.  5. The aortic valve was not well visualized. Aortic valve regurgitation  is not visualized. AV gradients were not measured    ASSESSMENT AND PLAN:  1.  Persistent atrial fibrillation: Currently on amiodarone (ECG monitoring for  high risk medication) and Eliquis with a CHA2DS2-VASc of 1.  She is now status post AF ablation on 03/25/2020.  She is remained in sinus rhythm.  We Cree Kunert stop Eliquis and amiodarone today.   2.  Obesity: Diet and exercise encouraged.  She is fortunately had a negative sleep study.     Current medicines are reviewed at length with the patient today.   The patient does not have concerns regarding her medicines.  The following changes were made today: Stop Eliquis, amiodarone  Labs/ tests ordered today include:  Orders Placed This Encounter  Procedures  . EKG 12-Lead     Disposition:   FU with Armenta Erskin 3 months  Signed, Tudor Chandley Meredith Leeds, MD  06/28/2020 9:59 AM     [facility] [address] [place] [place] [address] [phone] (office) [phone] (fax)

## 2020-07-21 ENCOUNTER — Telehealth: Payer: Self-pay | Admitting: *Deleted

## 2020-07-21 NOTE — Telephone Encounter (Signed)
John,  Can you please review this pt's chart- she has had significant cardiac issues this year , persistent A Fib-  I think she needs an OV but can I have your opinion prior to me calling her.  I feel like she needs to see GI in office to discuss options.  Please review and let me know- Thanks  Lelan Pons

## 2020-07-22 NOTE — Telephone Encounter (Signed)
Benjamine Mola,  This pt's AF was successfully ablated and she is now in sustained NSR.  She is cleared for anesthetic care at [facility].  Thanks,  Osvaldo Angst

## 2020-07-22 NOTE — Telephone Encounter (Signed)
NOTED

## 2020-07-29 ENCOUNTER — Other Ambulatory Visit: Payer: Self-pay

## 2020-07-29 ENCOUNTER — Ambulatory Visit (AMBULATORY_SURGERY_CENTER): Payer: Self-pay

## 2020-07-29 VITALS — Ht 64.0 in | Wt 179.0 lb

## 2020-07-29 DIAGNOSIS — Z1211 Encounter for screening for malignant neoplasm of colon: Secondary | ICD-10-CM

## 2020-07-29 MED ORDER — SUTAB 1479-225-188 MG PO TABS
1.0000 | ORAL_TABLET | ORAL | 0 refills | Status: DC
Start: 2020-07-29 — End: 2020-08-12

## 2020-07-29 NOTE — Progress Notes (Signed)
No egg or soy allergy known to patient  No issues with past sedation with any surgeries or procedures No intubation problems in the past  No FH of Malignant Hyperthermia No diet pills per patient No home 02 use per patient  No blood thinners per patient  Pt denies issues with constipation  No A fib or A flutter  EMMI video via [place] 19 guidelines implemented in PV today with Pt and RN  Coupon given to pt in PV today , Code to Pharmacy  COVID vaccines completed on 07/24/2020 per pt;  Due to the COVID-19 pandemic we are asking patients to follow these guidelines. Please only bring one care partner. Please be aware that your care partner may wait in the car in the parking lot or if they feel like they will be too hot to wait in the car, they may wait in the lobby on the 4th floor. All care partners are required to wear a mask the entire time (we do not have any that we can provide them), they need to practice social distancing, and we will do a Covid check for all patient's and care partners when you arrive. Also we will check their temperature and your temperature. If the care partner waits in their car they need to stay in the parking lot the entire time and we will call them on their cell phone when the patient is ready for discharge so they can bring the car to the front of the building. Also all patient's will need to wear a mask into building.

## 2020-08-01 ENCOUNTER — Telehealth: Payer: Self-pay | Admitting: Gastroenterology

## 2020-08-01 NOTE — Telephone Encounter (Signed)
Patient will come by and get a sutab(lot [id number], exp 4/23) sample kit from the 3rd floor with new instructions.

## 2020-08-12 ENCOUNTER — Encounter: Payer: Self-pay | Admitting: Gastroenterology

## 2020-08-12 ENCOUNTER — Ambulatory Visit (AMBULATORY_SURGERY_CENTER): Payer: 59 | Admitting: Gastroenterology

## 2020-08-12 ENCOUNTER — Other Ambulatory Visit: Payer: Self-pay

## 2020-08-12 VITALS — BP 113/69 | HR 64 | Temp 97.7°F | Resp 12 | Ht 64.0 in | Wt 179.0 lb

## 2020-08-12 DIAGNOSIS — Z1211 Encounter for screening for malignant neoplasm of colon: Secondary | ICD-10-CM | POA: Diagnosis present

## 2020-08-12 MED ORDER — SODIUM CHLORIDE 0.9 % IV SOLN: 500.0000 mL | Freq: Once | INTRAVENOUS | Status: DC

## 2020-08-12 NOTE — Progress Notes (Signed)
pt tolerated well. VSS. awake and to recovery. Report given to RN.  

## 2020-08-12 NOTE — Progress Notes (Signed)
VS- Arman Bogus RN  Pt is on her period now  Pt's states no medical or surgical changes since previsit or office visit.

## 2020-08-12 NOTE — Patient Instructions (Signed)
YOU HAD AN ENDOSCOPIC PROCEDURE TODAY AT THE [facility]:   Refer to the procedure report that was given to you for any specific questions about what was found during the examination.  If the procedure report does not answer your questions, please call your gastroenterologist to clarify.  If you requested that your care partner not be given the details of your procedure findings, then the procedure report has been included in a sealed envelope for you to review at your convenience later.  YOU SHOULD EXPECT: Some feelings of bloating in the abdomen. Passage of more gas than usual.  Walking can help get rid of the air that was put into your GI tract during the procedure and reduce the bloating. If you had a lower endoscopy (such as a colonoscopy or flexible sigmoidoscopy) you may notice spotting of blood in your stool or on the toilet paper. If you underwent a bowel prep for your procedure, you may not have a normal bowel movement for a few days.  Please Note:  You might notice some irritation and congestion in your nose or some drainage.  This is from the oxygen used during your procedure.  There is no need for concern and it should clear up in a day or so.  SYMPTOMS TO REPORT IMMEDIATELY:   Following lower endoscopy (colonoscopy or flexible sigmoidoscopy):  Excessive amounts of blood in the stool  Significant tenderness or worsening of abdominal pains  Swelling of the abdomen that is new, acute  Fever of 100F or higher  For urgent or emergent issues, a gastroenterologist can be reached at any hour by calling [phone]. Do not use MyChart messaging for urgent concerns.    DIET:  We do recommend a small meal at first, but then you may proceed to your regular diet.  Drink plenty of fluids but you should avoid alcoholic beverages for 24 hours.  ACTIVITY:  You should plan to take it easy for the rest of today and you should NOT DRIVE or use heavy machinery until tomorrow (because  of the sedation medicines used during the test).    FOLLOW UP: Our staff will call the number listed on your records 48-72 hours following your procedure to check on you and address any questions or concerns that you may have regarding the information given to you following your procedure. If we do not reach you, we will leave a message.  We will attempt to reach you two times.  During this call, we will ask if you have developed any symptoms of COVID 19. If you develop any symptoms (ie: fever, flu-like symptoms, shortness of breath, cough etc.) before then, please call [phone].  If you test positive for Covid 19 in the 2 weeks post procedure, please call and report this information to us.    If any biopsies were taken you will be contacted by phone or by letter within the next 1-3 weeks.  Please call us at [phone] if you have not heard about the biopsies in 3 weeks.    SIGNATURES/CONFIDENTIALITY: You and/or your care partner have signed paperwork which will be entered into your electronic medical record.  These signatures attest to the fact that that the information above on your After Visit Summary has been reviewed and is understood.  Full responsibility of the confidentiality of this discharge information lies with you and/or your care-partner. 

## 2020-08-12 NOTE — Op Note (Signed)
[facility] Patient Name: Cindy Christian Procedure Date: 08/12/2020 11:02 AM MRN: [id number] Endoscopist: Mauri Pole , MD Age: 49 Referring MD:  Date of Birth: [birth year] Gender: Female Account #: [id number] Procedure:                Colonoscopy Indications:              Screening for colorectal malignant neoplasm Medicines:                Monitored Anesthesia Care Procedure:                Pre-Anesthesia Assessment:                           - Prior to the procedure, a History and Physical                            was performed, and patient medications and                            allergies were reviewed. The patient's tolerance of                            previous anesthesia was also reviewed. The risks                            and benefits of the procedure and the sedation                            options and risks were discussed with the patient.                            All questions were answered, and informed consent                            was obtained. Prior Anticoagulants: The patient has                            taken no previous anticoagulant or antiplatelet                            agents. ASA Grade Assessment: II - A patient with                            mild systemic disease. After reviewing the risks                            and benefits, the patient was deemed in                            satisfactory condition to undergo the procedure.                           After obtaining informed consent, the colonoscope  was passed under direct vision. Throughout the                            procedure, the patient's blood pressure, pulse, and                            oxygen saturations were monitored continuously. The                            Colonoscope was introduced through the anus and                            advanced to the the cecum, identified by                            appendiceal orifice  and ileocecal valve. The                            colonoscopy was performed without difficulty. The                            patient tolerated the procedure well. The quality                            of the bowel preparation was excellent. The                            ileocecal valve, appendiceal orifice, and rectum                            were photographed. Scope In: 11:08:48 AM Scope Out: 11:20:57 AM Scope Withdrawal Time: 0 hours 7 minutes 22 seconds  Total Procedure Duration: 0 hours 12 minutes 9 seconds  Findings:                 The perianal and digital rectal examinations were                            normal.                           A few small-mouthed diverticula were found in the                            sigmoid colon.                           Non-bleeding internal hemorrhoids were found during                            retroflexion. The hemorrhoids were medium-sized.                           The entire examined colon appeared normal otherwise. Complications:            No immediate complications. Estimated Blood Loss:  Estimated blood loss: none. Impression:               - Diverticulosis in the sigmoid colon.                           - Non-bleeding internal hemorrhoids.                           - The entire examined colon is normal.                           - No specimens collected. Recommendation:           - Patient has a contact number available for                            emergencies. The signs and symptoms of potential                            delayed complications were discussed with the                            patient. Return to normal activities tomorrow.                            Written discharge instructions were provided to the                            patient.                           - Resume previous diet.                           - Continue present medications.                           - Repeat colonoscopy in 10 years  for screening                            purposes. Mauri Pole, MD 08/12/2020 11:29:00 AM This report has been signed electronically.

## 2020-08-16 ENCOUNTER — Telehealth: Payer: Self-pay

## 2020-08-16 NOTE — Telephone Encounter (Signed)
No answer, left message to call if having any issues or concerns, B.Johniya Durfee RN 

## 2020-08-16 NOTE — Telephone Encounter (Signed)
Left message on answering machine. 

## 2020-08-31 NOTE — Progress Notes (Signed)
Cardiology Office Note:   Date:  09/01/2020  NAME:  Laurana Magistro    MRN: [id number] DOB:  [birth year]   PCP:  Ronnald Nian, DO  Cardiologist:  Evalina Field, MD  Electrophysiologist:  Constance Haw, MD   Referring MD: Carollee Herter, Alferd Apa, *   Chief Complaint  Patient presents with  . Atrial Fibrillation   History of Present Illness:   Devika Dragovich is a 49 y.o. female with a hx of Afib s/p ablation, obesity, CAD who presents for follow-up.  No further recurrence of atrial fibrillation.  Still having heart racing episodes at night.  When she exercises it will happen.  She has lost roughly 25 pounds.  She has a goal to lose 25 more pounds.  She is exercising and doing cardio as well as lifting weights.  No significant symptoms of chest pain or shortness of breath.  We did discuss the cardia mobile device.  She has not about this yet.  She should look into this to monitor her rhythm at home.  We did discuss her coronary calcium score which puts her in the 94th percentile.  I have recommended low-dose statin.  Most recent LDL 111.  She reports she would like to have her labs rechecked by her primary care physician before she decides on medical therapy.  I think this is reasonable.  She denies any chest pain, shortness of breath or palpitations in office today.  Her sleep apnea test was borderline several months ago.  She did not need CPAP machine.  I did congratulate her on this.  Problem List 1. Obesity BMI 32 2. Persistent Afib -failed TEE/DCCV and antiarrhythmics  -Afib ablation 03/25/2020 -CHADSVASC=2 (female, CAD) 3. CAD -CAC score 30 (94th percentile) -minimal prox LAD (<25%).  4. HLD  -T chol 183, HDL 49, LDL 110, TG 122  Past Medical History: Past Medical History:  Diagnosis Date  . Allergy    seasonal allergies  . Atrial fibrillation with RVR ([place]) 03/25/2020   had ablation-NSR   . Borderline glaucoma    popa    Past Surgical History: Past Surgical History:   Procedure Laterality Date  . ATRIAL FIBRILLATION ABLATION N/A 03/25/2020   Procedure: ATRIAL FIBRILLATION ABLATION;  Surgeon: Constance Haw, MD;  Location: [place] [facility];  Service: Cardiovascular;  Laterality: N/A;  . BUBBLE STUDY  12/30/2019   Procedure: BUBBLE STUDY;  Surgeon: Elouise Munroe, MD;  Location: [place];  Service: Cardiovascular;;  . CARDIOVERSION N/A 12/30/2019   Procedure: CARDIOVERSION;  Surgeon: Elouise Munroe, MD;  Location: [facility] ENDOSCOPY;  Service: Cardiovascular;  Laterality: N/A;  . FINGER FRACTURE SURGERY Left 1983   middle finger  . KNEE ARTHROPLASTY Bilateral 2000 R 2008 L  . LEEP  2004   CIN 1-2  . TEE WITHOUT CARDIOVERSION N/A 12/30/2019   Procedure: TRANSESOPHAGEAL ECHOCARDIOGRAM (TEE);  Surgeon: Elouise Munroe, MD;  Location: [facility] ENDOSCOPY;  Service: Cardiovascular;  Laterality: N/A;  . WISDOM TOOTH EXTRACTION      Current Medications: Current Meds  Medication Sig  . ascorbic acid (VITAMIN C) 500 MG tablet Take 500 mg by mouth daily.   Marland Kitchen b complex vitamins tablet Take 1 tablet by mouth daily.  Marland Kitchen MAGNESIUM CARBONATE PO Take by mouth daily.  Marland Kitchen MAGNESIUM GLYCINATE PO Take by mouth daily.  Marland Kitchen MILK THISTLE-DAND-FENNEL-LICOR PO Take by mouth daily.  Marland Kitchen OVER THE COUNTER MEDICATION Place 2 drops into both eyes daily. Simalasin Allergy Eye Relief - homeopathic   .  POTASSIUM GLUCONATE PO Take 175 mg by mouth daily.  Marland Kitchen VITAMIN D PO Take by mouth daily.  . Zinc 50 MG CAPS Take 50 mg by mouth daily.     Allergies:    Penicillins   Social History: Social History   Socioeconomic History  . Marital status: Single    Spouse name: Not on file  . Number of children: Not on file  . Years of education: Not on file  . Highest education level: Not on file  Occupational History  . Occupation: admin    Comment: Psychologist, counselling  Tobacco Use  . Smoking status: Former Smoker    Packs/day: 0.50    Years: 13.00    Pack years: 6.50    Quit  date: 04/24/1998    Years since quitting: 22.3  . Smokeless tobacco: Never Used  Vaping Use  . Vaping Use: Never used  Substance and Sexual Activity  . Alcohol use: Not Currently    Alcohol/week: 0.0 standard drinks    Comment: 0  . Drug use: No  . Sexual activity: Yes    Partners: Male  Other Topics Concern  . Not on file  Social History Narrative   Walking daily   Social Determinants of Health   Financial Resource Strain:   . Difficulty of Paying Living Expenses: Not on file  Food Insecurity:   . Worried About Charity fundraiser in the Last Year: Not on file  . Ran Out of Food in the Last Year: Not on file  Transportation Needs:   . Lack of Transportation (Medical): Not on file  . Lack of Transportation (Non-Medical): Not on file  Physical Activity:   . Days of Exercise per Week: Not on file  . Minutes of Exercise per Session: Not on file  Stress:   . Feeling of Stress : Not on file  Social Connections:   . Frequency of Communication with Friends and Family: Not on file  . Frequency of Social Gatherings with Friends and Family: Not on file  . Attends Religious Services: Not on file  . Active Member of Clubs or Organizations: Not on file  . Attends Archivist Meetings: Not on file  . Marital Status: Not on file     Family History: The patient's family history includes Colon polyps (age of onset: 73) in her mother; Diabetes in her maternal uncle; Stroke in her maternal grandmother. There is no history of Colon cancer, Esophageal cancer, Stomach cancer, or Rectal cancer.  ROS:   All other ROS reviewed and negative. Pertinent positives noted in the HPI.     EKGs/Labs/Other Studies Reviewed:   The following studies were personally reviewed by me today:   TTE 12/28/2019 1. Technically difficult study due to atrial fibrillation and poor  windows. Grossly normal LV systolic function. Left ventricular ejection  fraction, by estimation, is 55 to 60%. The left  ventricle has normal  function. The left ventricle has no regional  wall motion abnormalities. There is mild left ventricular hypertrophy.  Left ventricular diastolic parameters are indeterminate.  2. Right ventricular systolic function is normal. The right ventricular  size is mildly enlarged.  3. Right atrial size was mildly dilated.  4. The mitral valve is normal in structure. Mild mitral valve  regurgitation.  5. The aortic valve was not well visualized. Aortic valve regurgitation  is not visualized. AV gradients were not measured   CT Cardiac 03/22/2020 IMPRESSION: 1. There is normal pulmonary vein drainage into the  left atrium with ostial measurements above.  2. There is no thrombus in the left atrial appendage.  3. The esophagus runs in close proximity to left inferior pulmonary vein ostium.  4. No PFO/ASD.  5. Normal coronary origin. Right dominance.  6. CAC score of 30, which is 94th percentile for age- and sex-matched controls.  7. Minimal, calcified plaque (<25%) in the proximal LAD. Aggressive risk factor modification is recommended.  Recent Labs: 12/26/2019: B Natriuretic Peptide 179.7; TSH 5.209 12/31/2019: Magnesium 1.8 02/26/2020: ALT 34 03/02/2020: BUN 12; Creatinine, Ser 0.66; Hemoglobin 13.5; Platelets 251; Potassium 4.2; Sodium 141   Recent Lipid Panel    Component Value Date/Time   CHOL 183 10/29/2019 1151   TRIG 122.0 10/29/2019 1151   HDL 48.60 10/29/2019 1151   CHOLHDL 4 10/29/2019 1151   VLDL 24.4 10/29/2019 1151   LDLCALC 110 (H) 10/29/2019 1151    Physical Exam:   VS:  BP 120/78   Pulse 72   Ht 5\' 4"  (1.626 m)   Wt 175 lb 6.4 oz (79.6 kg)   LMP 08/12/2020   BMI 30.11 kg/m    Wt Readings from Last 3 Encounters:  09/01/20 175 lb 6.4 oz (79.6 kg)  08/12/20 179 lb (81.2 kg)  07/29/20 179 lb (81.2 kg)    General: Well nourished, well developed, in no acute distress Heart: Atraumatic, normal size  Eyes: PEERLA, EOMI  Neck:  Supple, no JVD Endocrine: No thryomegaly Cardiac: Normal S1, S2; RRR; no murmurs, rubs, or gallops Lungs: Clear to auscultation bilaterally, no wheezing, rhonchi or rales  Abd: Soft, nontender, no hepatomegaly  Ext: No edema, pulses 2+ Musculoskeletal: No deformities, BUE and BLE strength normal and equal Skin: Warm and dry, no rashes   Neuro: Alert and oriented to person, place, time, and situation, CNII-XII grossly intact, no focal deficits  Psych: Normal mood and affect   ASSESSMENT:   Haide Klinker is a 49 y.o. female who presents for the following: 1. Persistent atrial fibrillation ([place])   2. Coronary artery disease involving native coronary artery of native heart without angina pectoris   3. Agatston coronary artery calcium score less than 100   4. Mixed hyperlipidemia     PLAN:   1. Persistent atrial fibrillation ([place]) -She had difficult to control atrial fibrillation earlier this year.  Ultimately underwent cardiac ablation.  No further recurrence of A. fib.  She was taken off her Eliquis and medications given her low chads vas score. -CHADSVASC= 1 (female) -I have advised her to get the cardia mobile device to monitor her heart rhythm at home.  She does have infrequent palpitations and needs to monitor this.  She will can do this. -No sleep apnea on recent sleep study.  She is lost 25 pounds.  Has plans to lose 25 more.  I suspect this will keep her normal rhythm.  2. Coronary artery disease involving native coronary artery of native heart without angina pectoris 3. Agatston coronary artery calcium score less than 100 4. Mixed hyperlipidemia -Coronary calcium score of 30 which is 94 percentile due to her young age.  We discussed statin therapy.  She would like to hold on this for now.  She will have her lipid profile checked by her primary care physician and then she will let me know what the results of this are.  We can then decide on statin therapy at that time.  Disposition:  Return in about 1 year (around 09/01/2021).  Medication Adjustments/Labs and Tests Ordered: Current  medicines are reviewed at length with the patient today.  Concerns regarding medicines are outlined above.  No orders of the defined types were placed in this encounter.  No orders of the defined types were placed in this encounter.   Patient Instructions  Medication Instructions:  Your physician recommends that you continue on your current medications as directed. Please refer to the Current Medication list given to you today.  *If you need a refill on your cardiac medications before your next appointment, please call your pharmacy*  Lab Work: None ordered today  Testing/Procedures: None ordered today  Follow-Up: At [facility], you and your health needs are our priority.  As part of our continuing mission to provide you with exceptional heart care, we have created designated Provider Care Teams.  These Care Teams include your primary Cardiologist (physician) and Advanced Practice Providers (APPs -  Physician Assistants and Nurse Practitioners) who all work together to provide you with the care you need, when you need it.  Your next appointment:   12 month(s)  The format for your next appointment:   In Person  Provider:   You may see Evalina Field, MD or one of the following Advanced Practice Providers on your designated Care Team:    Almyra Deforest, PA-C  Fabian Sharp, [place] or   Roby Lofts, PA-C       Time Spent with Patient: I have spent a total of 25 minutes with patient reviewing hospital notes, telemetry, EKGs, labs and examining the patient as well as establishing an assessment and plan that was discussed with the patient.  > 50% of time was spent in direct patient care.  Signed, Addison Naegeli. Audie Box, [place]  [address], [place], [place] [address] [phone]  09/01/2020 9:22 AM

## 2020-09-01 ENCOUNTER — Ambulatory Visit (INDEPENDENT_AMBULATORY_CARE_PROVIDER_SITE_OTHER): Payer: 59 | Admitting: Cardiovascular Disease

## 2020-09-01 ENCOUNTER — Encounter: Payer: Self-pay | Admitting: Cardiovascular Disease

## 2020-09-01 ENCOUNTER — Other Ambulatory Visit: Payer: Self-pay

## 2020-09-01 VITALS — BP 120/78 | HR 72 | Ht 64.0 in | Wt 175.4 lb

## 2020-09-01 DIAGNOSIS — I2510 Atherosclerotic heart disease of native coronary artery without angina pectoris: Secondary | ICD-10-CM | POA: Diagnosis not present

## 2020-09-01 DIAGNOSIS — I4819 Other persistent atrial fibrillation: Secondary | ICD-10-CM

## 2020-09-01 DIAGNOSIS — R931 Abnormal findings on diagnostic imaging of heart and coronary circulation: Secondary | ICD-10-CM | POA: Diagnosis not present

## 2020-09-01 DIAGNOSIS — E782 Mixed hyperlipidemia: Secondary | ICD-10-CM | POA: Diagnosis not present

## 2020-09-01 NOTE — Patient Instructions (Signed)
Medication Instructions:  Your physician recommends that you continue on your current medications as directed. Please refer to the Current Medication list given to you today.  *If you need a refill on your cardiac medications before your next appointment, please call your pharmacy*  Lab Work: None ordered today  Testing/Procedures: None ordered today  Follow-Up: At [facility], you and your health needs are our priority.  As part of our continuing mission to provide you with exceptional heart care, we have created designated Provider Care Teams.  These Care Teams include your primary Cardiologist (physician) and Advanced Practice Providers (APPs -  Physician Assistants and Nurse Practitioners) who all work together to provide you with the care you need, when you need it.  Your next appointment:   12 month(s)  The format for your next appointment:   In Person  Provider:   You may see Evalina Field, MD or one of the following Advanced Practice Providers on your designated Care Team:    Almyra Deforest, PA-C  Fabian Sharp, PA-C or   Roby Lofts, [place]

## 2020-09-06 ENCOUNTER — Encounter (HOSPITAL_BASED_OUTPATIENT_CLINIC_OR_DEPARTMENT_OTHER): Payer: Self-pay

## 2020-09-06 ENCOUNTER — Other Ambulatory Visit: Payer: Self-pay

## 2020-09-06 ENCOUNTER — Ambulatory Visit (INDEPENDENT_AMBULATORY_CARE_PROVIDER_SITE_OTHER): Payer: 59 | Admitting: Cardiology

## 2020-09-06 ENCOUNTER — Emergency Department (HOSPITAL_BASED_OUTPATIENT_CLINIC_OR_DEPARTMENT_OTHER)
Admission: EM | Admit: 2020-09-06 | Discharge: 2020-09-06 | Disposition: A | Discharge status: Home / Self Care | Payer: 59 | Attending: Emergency Medicine | Admitting: Emergency Medicine

## 2020-09-06 ENCOUNTER — Encounter: Payer: Self-pay | Admitting: Cardiology

## 2020-09-06 VITALS — BP 98/72 | HR 98 | Ht 64.0 in | Wt 175.0 lb

## 2020-09-06 DIAGNOSIS — I4819 Other persistent atrial fibrillation: Secondary | ICD-10-CM | POA: Diagnosis not present

## 2020-09-06 DIAGNOSIS — Z5321 Procedure and treatment not carried out due to patient leaving prior to being seen by health care provider: Secondary | ICD-10-CM | POA: Insufficient documentation

## 2020-09-06 DIAGNOSIS — R000 Tachycardia, unspecified: Secondary | ICD-10-CM | POA: Insufficient documentation

## 2020-09-06 NOTE — ED Notes (Signed)
Reviewed case with EDP Belfi-no orders at this time

## 2020-09-06 NOTE — Progress Notes (Signed)
Electrophysiology Office Note   Date:  09/06/2020   ID:  Cindy Christian, DOB December 22, 1970, MRN [id number]  PCP:  Ronnald Nian, DO  Cardiologist:  O'MVEH Primary Electrophysiologist:  Wesam Gearhart Meredith Leeds, MD    Chief Complaint: AF   History of Present Illness: Cindy Christian is a 49 y.o. female who is being seen today for the evaluation of AF at the request of Cirigliano, Garvin Fila, DO. Presenting today for electrophysiology evaluation.  She initially presented to [facility] 12/26/2019 with 5 weeks worth of shortness of breath, abdominal bloating, and palpitations.  She was found to be in rapid atrial fibrillation.  She had a cardioversion and quickly went back into atrial fibrillation.  She was loaded on flecainide with repeat cardioversion to normal rhythm.  She presented to cardiology clinic and was found to be in atrial fibrillation was loaded on amiodarone.  She is status post AF ablation 03/25/2020.  Today, denies symptoms of palpitations, chest pain, shortness of breath, orthopnea, PND, lower extremity edema, claudication, dizziness, presyncope, syncope, bleeding, or neurologic sequela. The patient is tolerating medications without difficulties.  Since last being seen she has done well.  She has noted no further episodes of atrial fibrillation.  This morning, she felt palpitations.  She checked her pulse and her heart rate was fast.  She walked around work for approximately 30 minutes, but her palpitations continued.  She went to the emergency room for an ECG which showed sinus tachycardia.   Past Medical History:  Diagnosis Date  . Allergy    seasonal allergies  . Atrial fibrillation with RVR ([place]) 03/25/2020   had ablation-NSR   . Borderline glaucoma    popa   Past Surgical History:  Procedure Laterality Date  . ATRIAL FIBRILLATION ABLATION N/A 03/25/2020   Procedure: ATRIAL FIBRILLATION ABLATION;  Surgeon: Constance Haw, MD;  Location: [place] [facility];  Service:  Cardiovascular;  Laterality: N/A;  . BUBBLE STUDY  12/30/2019   Procedure: BUBBLE STUDY;  Surgeon: Elouise Munroe, MD;  Location: [place];  Service: Cardiovascular;;  . CARDIOVERSION N/A 12/30/2019   Procedure: CARDIOVERSION;  Surgeon: Elouise Munroe, MD;  Location: [facility] ENDOSCOPY;  Service: Cardiovascular;  Laterality: N/A;  . FINGER FRACTURE SURGERY Left 1983   middle finger  . KNEE ARTHROPLASTY Bilateral 2000 R 2008 L  . LEEP  2004   CIN 1-2  . TEE WITHOUT CARDIOVERSION N/A 12/30/2019   Procedure: TRANSESOPHAGEAL ECHOCARDIOGRAM (TEE);  Surgeon: Elouise Munroe, MD;  Location[facility] ENDOSCOPY;  Service: Cardiovascular;  Laterality: N/A;  . WISDOM TOOTH EXTRACTION       Current Outpatient Medications  Medication Sig Dispense Refill  . ascorbic acid (VITAMIN C) 500 MG tablet Take 500 mg by mouth daily.     Marland Kitchen b complex vitamins tablet Take 1 tablet by mouth daily.    Marland Kitchen MAGNESIUM CARBONATE PO Take by mouth daily.    Marland Kitchen MAGNESIUM GLYCINATE PO Take by mouth daily.    Marland Kitchen MILK THISTLE-DAND-FENNEL-LICOR PO Take by mouth daily.    Marland Kitchen OVER THE COUNTER MEDICATION Place 2 drops into both eyes daily. Simalasin Allergy Eye Relief - homeopathic     . POTASSIUM GLUCONATE PO Take 175 mg by mouth daily.    Marland Kitchen VITAMIN D PO Take by mouth daily.    . Zinc 50 MG CAPS Take 50 mg by mouth daily.     No current facility-administered medications for this visit.    Allergies:   Penicillins  Social History:  The patient  reports that she quit smoking about 22 years ago. She has a 6.50 pack-year smoking history. She has never used smokeless tobacco. She reports previous alcohol use. She reports that she does not use drugs.   Family History:  The patient's family history includes Colon polyps (age of onset: 67) in her mother; Diabetes in her maternal uncle; Stroke in her maternal grandmother.   ROS:  Please see the history of present illness.   Otherwise, review of systems is positive for none.   All  other systems are reviewed and negative.   PHYSICAL EXAM: VS:  BP 98/72   Pulse 98   Ht 5\' 4"  (1.626 m)   Wt 175 lb (79.4 kg)   LMP 08/12/2020   SpO2 100%   BMI 30.04 kg/m  , BMI Body mass index is 30.04 kg/m. GEN: Well nourished, well developed, in no acute distress  HEENT: normal  Neck: no JVD, carotid bruits, or masses Cardiac: RRR; no murmurs, rubs, or gallops,no edema  Respiratory:  clear to auscultation bilaterally, normal work of breathing GI: soft, nontender, nondistended, + BS MS: no deformity or atrophy  Skin: warm and dry Neuro:  Strength and sensation are intact Psych: euthymic mood, full affect  EKG:  EKG is ordered today. Personal review of the ekg ordered shows sinus rhythm, rate 102  Recent Labs: 12/26/2019: B Natriuretic Peptide 179.7; TSH 5.209 12/31/2019: Magnesium 1.8 02/26/2020: ALT 34 03/02/2020: BUN 12; Creatinine, Ser 0.66; Hemoglobin 13.5; Platelets 251; Potassium 4.2; Sodium 141    Lipid Panel     Component Value Date/Time   CHOL 183 10/29/2019 1151   TRIG 122.0 10/29/2019 1151   HDL 48.60 10/29/2019 1151   CHOLHDL 4 10/29/2019 1151   VLDL 24.4 10/29/2019 1151   LDLCALC 110 (H) 10/29/2019 1151     Wt Readings from Last 3 Encounters:  09/06/20 175 lb (79.4 kg)  09/06/20 177 lb (80.3 kg)  09/01/20 175 lb 6.4 oz (79.6 kg)      Other studies Reviewed: Additional studies/ records that were reviewed today include: TTE 12/28/19  Review of the above records today demonstrates:  1. Technically difficult study due to atrial fibrillation and poor  windows. Grossly normal LV systolic function. Left ventricular ejection  fraction, by estimation, is 55 to 60%. The left ventricle has normal  function. The left ventricle has no regional  wall motion abnormalities. There is mild left ventricular hypertrophy.  Left ventricular diastolic parameters are indeterminate.  2. Right ventricular systolic function is normal. The right ventricular  size is  mildly enlarged.  3. Right atrial size was mildly dilated.  4. The mitral valve is normal in structure. Mild mitral valve  regurgitation.  5. The aortic valve was not well visualized. Aortic valve regurgitation  is not visualized. AV gradients were not measured    ASSESSMENT AND PLAN:  1.  Persistent atrial fibrillation: Status post AF ablation 03/25/2020.  CHA2DS2-VASc of 1.  Amiodarone and Eliquis were stopped at her last visit.  She has not had any further episodes of atrial fibrillation.  She feels a little fatigued with palpitations today.  Her heart rate is elevated.  She went to the emergency room this morning and was in sinus tachycardia.  She Emil Klassen continue to monitor over the next couple days and let us know if things do not normalize.  She Pierce Barocio also likely get a cardio mobile.   2.  Obesity: Diet and exercise encouraged.  She has  had a negative sleep study.   Current medicines are reviewed at length with the patient today.   The patient does not have concerns regarding her medicines.  The following changes were made today: None  Labs/ tests ordered today include:  No orders of the defined types were placed in this encounter.    Disposition:   FU with Ronnita Paz 6 months  Signed, Denham Mose Meredith Leeds, MD  09/06/2020 3:04 PM     [facility] [address] [place] [address] [phone] (office) [phone] (fax)

## 2020-09-06 NOTE — ED Notes (Signed)
Spoke with pt at length-states she is leaving due to she has f/u appt with cardiology at 3pm-left triage NAD-steady gait

## 2020-09-06 NOTE — ED Triage Notes (Signed)
Pt states HR from 60s to 115 ~2 hours PTA-pt states she ha shx of Afib with an ablation June 2021-NAD-steady gait

## 2020-09-22 ENCOUNTER — Ambulatory Visit (INDEPENDENT_AMBULATORY_CARE_PROVIDER_SITE_OTHER): Payer: 59 | Admitting: Family Medicine

## 2020-09-22 ENCOUNTER — Encounter: Payer: Self-pay | Admitting: Family Medicine

## 2020-09-22 ENCOUNTER — Other Ambulatory Visit: Payer: Self-pay

## 2020-09-22 VITALS — BP 116/70 | HR 77 | Temp 97.8°F | Ht 63.75 in | Wt 173.8 lb

## 2020-09-22 DIAGNOSIS — R7989 Other specified abnormal findings of blood chemistry: Secondary | ICD-10-CM | POA: Diagnosis not present

## 2020-09-22 DIAGNOSIS — Z1329 Encounter for screening for other suspected endocrine disorder: Secondary | ICD-10-CM | POA: Diagnosis not present

## 2020-09-22 DIAGNOSIS — I4819 Other persistent atrial fibrillation: Secondary | ICD-10-CM | POA: Diagnosis not present

## 2020-09-22 DIAGNOSIS — Z0000 Encounter for general adult medical examination without abnormal findings: Secondary | ICD-10-CM

## 2020-09-22 DIAGNOSIS — Z13220 Encounter for screening for lipoid disorders: Secondary | ICD-10-CM | POA: Diagnosis not present

## 2020-09-22 DIAGNOSIS — Z1321 Encounter for screening for nutritional disorder: Secondary | ICD-10-CM

## 2020-09-22 DIAGNOSIS — Z2821 Immunization not carried out because of patient refusal: Secondary | ICD-10-CM

## 2020-09-22 LAB — BASIC METABOLIC PANEL
BUN: 10 mg/dL (ref 6–23)
CO2: 30 mEq/L (ref 19–32)
Calcium: 9.4 mg/dL (ref 8.4–10.5)
Chloride: 104 mEq/L (ref 96–112)
Creatinine, Ser: 0.68 mg/dL (ref 0.40–1.20)
GFR: 101.24 mL/min (ref >60.00)
Glucose, Bld: 85 mg/dL (ref 70–99)
Potassium: 4 mEq/L (ref 3.5–5.1)
Sodium: 139 mEq/L (ref 135–145)

## 2020-09-22 LAB — CBC
HCT: 39.2 % (ref 36.0–46.0)
Hemoglobin: 12.9 g/dL (ref 12.0–15.0)
MCHC: 33 g/dL (ref 30.0–36.0)
MCV: 93.1 fl (ref 78.0–100.0)
Platelets: 244 10*3/uL (ref 150.0–400.0)
RBC: 4.21 Mil/uL (ref 3.87–5.11)
RDW: 14.5 % (ref 11.5–15.5)
WBC: 4.9 10*3/uL (ref 4.0–10.5)

## 2020-09-22 LAB — ALT: ALT: 20 U/L (ref 0–35)

## 2020-09-22 LAB — LIPID PANEL
Cholesterol: 190 mg/dL (ref 0–200)
HDL: 45.1 mg/dL (ref >39.00)
LDL Cholesterol: 108 mg/dL — ABNORMAL HIGH (ref 0–99)
NonHDL: 144.84
Total CHOL/HDL Ratio: 4
Triglycerides: 184 mg/dL — ABNORMAL HIGH (ref 0.0–149.0)
VLDL: 36.8 mg/dL (ref 0.0–40.0)

## 2020-09-22 LAB — AST: AST: 22 U/L (ref 0–37)

## 2020-09-22 LAB — VITAMIN D 25 HYDROXY (VIT D DEFICIENCY, FRACTURES): VITD: 41.14 ng/mL (ref 30.00–100.00)

## 2020-09-22 LAB — TSH: TSH: 4.65 u[IU]/mL — ABNORMAL HIGH (ref 0.35–4.50)

## 2020-09-22 NOTE — Progress Notes (Signed)
Cindy Christian is a 49 y.o. female  Chief Complaint  Patient presents with  . Establish Care    TOC- CPE/labs.  Fasting today. Declines flu shot today.      HPI: Cindy Christian is a 49 y.o. female seen today for annual CPE, fasting labs.  She follows with cardio. She had ablation in 03/2020 for persistent a-fib. She has had covid vaccine. She declines flu vaccine. She has not had shingrix vaccine but will consider.   Last PAP: 10/2019 Last mammo: 11/2019 Last colonoscopy:  08/2020 - Dr. Silverio Decamp - due in 11/203  Dental: UTD 08/2020 Vision: UTD 05/2020  Med refills needed today? none   Past Medical History:  Diagnosis Date  . Allergy    seasonal allergies  . Atrial fibrillation with RVR ([place]) 03/25/2020   had ablation-NSR   . Borderline glaucoma    [place]    Past Surgical History:  Procedure Laterality Date  . ABLATION  03/25/2020  . ATRIAL FIBRILLATION ABLATION N/A 03/25/2020   Procedure: ATRIAL FIBRILLATION ABLATION;  Surgeon: Constance Haw, MD;  Location: [facility];  Service: Cardiovascular;  Laterality: N/A;  . BUBBLE STUDY  12/30/2019   Procedure: BUBBLE STUDY;  Surgeon: Elouise Munroe, MD;  Location: [place];  Service: Cardiovascular;;  . CARDIOVERSION N/A 12/30/2019   Procedure: CARDIOVERSION;  Surgeon: Elouise Munroe, MD;  Location: [facility];  Service: Cardiovascular;  Laterality: N/A;  . FINGER FRACTURE SURGERY Left 1983   middle finger  . KNEE ARTHROPLASTY Bilateral 2000 R 2008 L  . LEEP  2004   CIN 1-2  . TEE WITHOUT CARDIOVERSION N/A 12/30/2019   Procedure: TRANSESOPHAGEAL ECHOCARDIOGRAM (TEE);  Surgeon: Elouise Munroe, MD;  Location: [place];  Service: Cardiovascular;  Laterality: N/A;  . WISDOM TOOTH EXTRACTION      Social History   Socioeconomic History  . Marital status: Single    Spouse name: Not on file  . Number of children: Not on file  . Years of education: Not on file  . Highest education level: Not on file   Occupational History  . Occupation: admin    Comment: Psychologist, counselling  Tobacco Use  . Smoking status: Former Smoker    Packs/day: 0.50    Years: 13.00    Pack years: 6.50    Quit date: 04/24/1998    Years since quitting: 22.4  . Smokeless tobacco: Never Used  Vaping Use  . Vaping Use: Never used  Substance and Sexual Activity  . Alcohol use: Not Currently  . Drug use: No  . Sexual activity: Yes    Partners: Male  Other Topics Concern  . Not on file  Social History Narrative   Walking daily   Social Determinants of Health   Financial Resource Strain: Not on file  Food Insecurity: Not on file  Transportation Needs: Not on file  Physical Activity: Not on file  Stress: Not on file  Social Connections: Not on file  Intimate Partner Violence: Not on file    Family History  Problem Relation Age of Onset  . Colon polyps Mother 59  . Diabetes Maternal Uncle        Type 1  . Stroke Maternal Grandmother   . Colon cancer Neg Hx   . Esophageal cancer Neg Hx   . Stomach cancer Neg Hx   . Rectal cancer Neg Hx      Immunization History  Administered Date(s) Administered  . PFIZER SARS-COV-2 Vaccination 07/03/2020, 07/24/2020  Outpatient Encounter Medications as of 09/22/2020  Medication Sig  . ascorbic acid (VITAMIN C) 500 MG tablet Take 500 mg by mouth daily.   Marland Kitchen b complex vitamins tablet Take 1 tablet by mouth daily.  Marland Kitchen MAGNESIUM CARBONATE PO Take by mouth daily.  Marland Kitchen MAGNESIUM GLYCINATE PO Take by mouth daily.  Marland Kitchen MILK THISTLE-DAND-FENNEL-LICOR PO Take by mouth daily.  Marland Kitchen OVER THE COUNTER MEDICATION Place 2 drops into both eyes daily. Simalasin Allergy Eye Relief - homeopathic  . POTASSIUM GLUCONATE PO Take 175 mg by mouth daily.  Marland Kitchen VITAMIN D PO Take by mouth daily.  . Zinc 50 MG CAPS Take 50 mg by mouth daily.   No facility-administered encounter medications on file as of 09/22/2020.     ROS: Gen: no fever, chills  Skin: no rash, itching ENT: no ear pain,  ear drainage, nasal congestion, rhinorrhea, sinus pressure, sore throat Eyes: no blurry vision, double vision Resp: no cough, wheeze,SOB CV: no CP, palpitations, LE edema,  GI: no heartburn, n/v/d/c, abd pain GU: no dysuria, urgency, frequency, hematuria MSK: no joint pain, myalgias, back pain Neuro: no dizziness, headache, weakness, vertigo Psych: no depression, anxiety, insomnia   Allergies  Allergen Reactions  . Penicillins Other (See Comments)    Childhood allergy Did it involve swelling of the face/tongue/throat, SOB, or low BP? Unknown Did it involve sudden or severe rash/hives, skin peeling, or any reaction on the inside of your mouth or nose? Unknown Did you need to seek medical attention at a hospital or doctor's office? Unknown When did it last happen?young child If all above answers are "NO", may proceed with cephalosporin use.    BP 116/70   Pulse 77   Temp 97.8 F (36.6 C) (Temporal)   Ht 5' 3.75" (1.619 m)   Wt 173 lb 12.8 oz (78.8 kg)   SpO2 96%   BMI 30.07 kg/m   Physical Exam Constitutional:      General: She is not in acute distress.    Appearance: She is well-developed and well-nourished.  HENT:     Head: Normocephalic and atraumatic.     Right Ear: Tympanic membrane and ear canal normal.     Left Ear: Tympanic membrane and ear canal normal.     Nose: Nose normal.     Mouth/Throat:     Mouth: Oropharynx is clear and moist and mucous membranes are normal.  Eyes:     Conjunctiva/sclera: Conjunctivae normal.     Pupils: Pupils are equal, round, and reactive to light.  Neck:     Thyroid: No thyromegaly.  Cardiovascular:     Rate and Rhythm: Normal rate and regular rhythm.     Pulses: Intact distal pulses.     Heart sounds: Normal heart sounds. No murmur heard.   Pulmonary:     Effort: Pulmonary effort is normal. No respiratory distress.     Breath sounds: Normal breath sounds. No wheezing or rhonchi.  Abdominal:     General: Bowel sounds  are normal. There is no distension.     Palpations: Abdomen is soft. There is no mass.     Tenderness: There is no abdominal tenderness.  Musculoskeletal:        General: No edema.     Cervical back: Neck supple.     Right lower leg: No edema.     Left lower leg: No edema.  Lymphadenopathy:     Cervical: No cervical adenopathy.  Skin:    General: Skin is warm and  dry.  Neurological:     Mental Status: She is alert and oriented to person, place, and time.     Motor: No abnormal muscle tone.     Coordination: Coordination normal.  Psychiatric:        Mood and Affect: Mood and affect normal.        Behavior: Behavior normal.      A/P:  1. Annual physical exam - discussed importance of regular CV exercise, healthy diet, adequate sleep - UTD on dental and vision exams - UTD on mammo, PAP, colonoscopy - declines flu vaccine, will consider shingrix vaccine, had covid vaccine x 2 - ALT - AST - Basic metabolic panel - Lipid panel - CBC - next CPE in 1 year  2. Persistent atrial fibrillation ([place]) - follows with cardio  3. Screening for lipid disorders - Lipid panel - pt states cardio recommended statin but she declined at the time  4. Encounter for vitamin deficiency screening - VITAMIN D 25 Hydroxy (Vit-D Deficiency, Fractures)  5. Influenza vaccination declined by patient  6. Screening for thyroid disorder - TSH   This visit occurred during the SARS-CoV-2 public health emergency.  Safety protocols were in place, including screening questions prior to the visit, additional usage of staff PPE, and extensive cleaning of exam room while observing appropriate contact time as indicated for disinfecting solutions.

## 2020-09-22 NOTE — Patient Instructions (Addendum)
shingrix (shingles vaccine)  [facility]    Health Maintenance, Female Adopting a healthy lifestyle and getting preventive care are important in promoting health and wellness. Ask your health care provider about:  The right schedule for you to have regular tests and exams.  Things you can do on your own to prevent diseases and keep yourself healthy. What should I know about diet, weight, and exercise? Eat a healthy diet   Eat a diet that includes plenty of vegetables, fruits, low-fat dairy products, and lean protein.  Do not eat a lot of foods that are high in solid fats, added sugars, or sodium. Maintain a healthy weight Body mass index (BMI) is used to identify weight problems. It estimates body fat based on height and weight. Your health care provider can help determine your BMI and help you achieve or maintain a healthy weight. Get regular exercise Get regular exercise. This is one of the most important things you can do for your health. Most adults should:  Exercise for at least 150 minutes each week. The exercise should increase your heart rate and make you sweat (moderate-intensity exercise).  Do strengthening exercises at least twice a week. This is in addition to the moderate-intensity exercise.  Spend less time sitting. Even light physical activity can be beneficial. Watch cholesterol and blood lipids Have your blood tested for lipids and cholesterol at 50 years of age, then have this test every 5 years. Have your cholesterol levels checked more often if:  Your lipid or cholesterol levels are high.  You are older than 50 years of age.  You are at high risk for heart disease. What should I know about cancer screening? Depending on your health history and family history, you may need to have cancer screening at various ages. This may include screening for:  Breast cancer.  Cervical cancer.  Colorectal cancer.  Skin cancer.  Lung cancer. What should I  know about heart disease, diabetes, and high blood pressure? Blood pressure and heart disease  High blood pressure causes heart disease and increases the risk of stroke. This is more likely to develop in people who have high blood pressure readings, are of African descent, or are overweight.  Have your blood pressure checked: ? Every 3-5 years if you are 59-89 years of age. ? Every year if you are 2 years old or older. Diabetes Have regular diabetes screenings. This checks your fasting blood sugar level. Have the screening done:  Once every three years after age 40 if you are at a normal weight and have a low risk for diabetes.  More often and at a younger age if you are overweight or have a high risk for diabetes. What should I know about preventing infection? Hepatitis B If you have a higher risk for hepatitis B, you should be screened for this virus. Talk with your health care provider to find out if you are at risk for hepatitis B infection. Hepatitis C Testing is recommended for:  Everyone born from [birth year] through 1966.  Anyone with known risk factors for hepatitis C. Sexually transmitted infections (STIs)  Get screened for STIs, including gonorrhea and chlamydia, if: ? You are sexually active and are younger than 50 years of age. ? You are older than 50 years of age and your health care provider tells you that you are at risk for this type of infection. ? Your sexual activity has changed since you were last screened, and you are at increased risk for  chlamydia or gonorrhea. Ask your health care provider if you are at risk.  Ask your health care provider about whether you are at high risk for HIV. Your health care provider may recommend a prescription medicine to help prevent HIV infection. If you choose to take medicine to prevent HIV, you should first get tested for HIV. You should then be tested every 3 months for as long as you are taking the medicine. Pregnancy  If you are  about to stop having your period (premenopausal) and you may become pregnant, seek counseling before you get pregnant.  Take 400 to 800 micrograms (mcg) of folic acid every day if you become pregnant.  Ask for birth control (contraception) if you want to prevent pregnancy. Osteoporosis and menopause Osteoporosis is a disease in which the bones lose minerals and strength with aging. This can result in bone fractures. If you are 71 years old or older, or if you are at risk for osteoporosis and fractures, ask your health care provider if you should:  Be screened for bone loss.  Take a calcium or vitamin D supplement to lower your risk of fractures.  Be given hormone replacement therapy (HRT) to treat symptoms of menopause. Follow these instructions at home: Lifestyle  Do not use any products that contain nicotine or tobacco, such as cigarettes, e-cigarettes, and chewing tobacco. If you need help quitting, ask your health care provider.  Do not use street drugs.  Do not share needles.  Ask your health care provider for help if you need support or information about quitting drugs. Alcohol use  Do not drink alcohol if: ? Your health care provider tells you not to drink. ? You are pregnant, may be pregnant, or are planning to become pregnant.  If you drink alcohol: ? Limit how much you use to 0-1 drink a day. ? Limit intake if you are breastfeeding.  Be aware of how much alcohol is in your drink. In the [place] one drink equals one 12 oz bottle of beer (355 mL), one 5 oz glass of wine (148 mL), or one 1 oz glass of hard liquor (44 mL). General instructions  Schedule regular health, dental, and eye exams.  Stay current with your vaccines.  Tell your health care provider if: ? You often feel depressed. ? You have ever been abused or do not feel safe at home. Summary  Adopting a healthy lifestyle and getting preventive care are important in promoting health and wellness.  Follow  your health care provider's instructions about healthy diet, exercising, and getting tested or screened for diseases.  Follow your health care provider's instructions on monitoring your cholesterol and blood pressure. This information is not intended to replace advice given to you by your health care provider. Make sure you discuss any questions you have with your health care provider. Document Revised: 09/10/2018 Document Reviewed: 09/10/2018 Elsevier Patient Education  2020 [employer].

## 2020-10-05 ENCOUNTER — Encounter: Payer: Self-pay | Admitting: Family Medicine

## 2020-10-05 ENCOUNTER — Other Ambulatory Visit: Payer: Self-pay | Admitting: Family Medicine

## 2020-10-05 DIAGNOSIS — R7989 Other specified abnormal findings of blood chemistry: Secondary | ICD-10-CM

## 2020-10-05 NOTE — Telephone Encounter (Signed)
Please see message . Thank you .

## 2020-10-12 ENCOUNTER — Other Ambulatory Visit: Payer: 59

## 2020-10-13 ENCOUNTER — Other Ambulatory Visit: Payer: Self-pay

## 2020-10-14 ENCOUNTER — Other Ambulatory Visit (INDEPENDENT_AMBULATORY_CARE_PROVIDER_SITE_OTHER): Payer: 59

## 2020-10-14 DIAGNOSIS — R7989 Other specified abnormal findings of blood chemistry: Secondary | ICD-10-CM

## 2020-10-14 LAB — T4, FREE: Free T4: 0.79 ng/dL (ref 0.60–1.60)

## 2020-10-19 LAB — EXTRA SPECIMEN

## 2020-10-19 LAB — T3: T3, Total: 109 ng/dL (ref 76–181)

## 2020-10-19 LAB — THYROID STIMULATING IMMUNOGLOBULIN

## 2020-10-19 LAB — THYROID PEROXIDASE ANTIBODY: Thyroperoxidase Ab SerPl-aCnc: <1 IU/mL (ref <9)

## 2020-10-27 ENCOUNTER — Other Ambulatory Visit: Payer: Self-pay

## 2020-10-27 NOTE — Addendum Note (Signed)
Addended by: Lynnea Ferrier on: 10/27/2020 04:44 PM   Modules accepted: Orders

## 2020-10-27 NOTE — Addendum Note (Signed)
Addended by: Lynnea Ferrier on: 10/27/2020 04:37 PM   Modules accepted: Orders

## 2020-10-27 NOTE — Addendum Note (Signed)
Addended by: Lynnea Ferrier on: 10/27/2020 04:43 PM   Modules accepted: Orders

## 2020-10-28 ENCOUNTER — Other Ambulatory Visit (INDEPENDENT_AMBULATORY_CARE_PROVIDER_SITE_OTHER): Payer: 59

## 2020-10-28 DIAGNOSIS — R7989 Other specified abnormal findings of blood chemistry: Secondary | ICD-10-CM | POA: Diagnosis not present

## 2020-10-28 LAB — TSH: TSH: 4.77 u[IU]/mL — ABNORMAL HIGH (ref 0.35–4.50)

## 2020-10-30 LAB — THYROID STIMULATING IMMUNOGLOBULIN: Thyroid Stim Immunoglobulin: <0.1 IU/L (ref 0.00–0.55)

## 2020-11-10 ENCOUNTER — Encounter: Payer: Self-pay | Admitting: Family Medicine

## 2021-01-30 ENCOUNTER — Emergency Department (HOSPITAL_BASED_OUTPATIENT_CLINIC_OR_DEPARTMENT_OTHER)
Admission: EM | Admit: 2021-01-30 | Discharge: 2021-01-30 | Disposition: A | Discharge status: Home / Self Care | Payer: 59 | Attending: Emergency Medicine | Admitting: Emergency Medicine

## 2021-01-30 ENCOUNTER — Other Ambulatory Visit: Payer: Self-pay

## 2021-01-30 ENCOUNTER — Encounter (HOSPITAL_BASED_OUTPATIENT_CLINIC_OR_DEPARTMENT_OTHER): Payer: Self-pay | Admitting: *Deleted

## 2021-01-30 DIAGNOSIS — I959 Hypotension, unspecified: Secondary | ICD-10-CM | POA: Diagnosis not present

## 2021-01-30 DIAGNOSIS — Z5321 Procedure and treatment not carried out due to patient leaving prior to being seen by health care provider: Secondary | ICD-10-CM | POA: Insufficient documentation

## 2021-01-30 NOTE — ED Triage Notes (Signed)
She thinks she was hypotension this am. She felt like she was going to pass out. Hx of atrial fib in the past and had the same feeling she has today. She has had an ablation. EKG at triage.

## 2021-02-03 ENCOUNTER — Encounter: Payer: Self-pay | Admitting: Family Medicine

## 2021-02-03 ENCOUNTER — Ambulatory Visit (INDEPENDENT_AMBULATORY_CARE_PROVIDER_SITE_OTHER): Payer: 59 | Admitting: Family Medicine

## 2021-02-03 ENCOUNTER — Other Ambulatory Visit: Payer: Self-pay

## 2021-02-03 VITALS — BP 119/80 | HR 90 | Temp 98.4°F | Ht 63.75 in | Wt 165.0 lb

## 2021-02-03 DIAGNOSIS — R7989 Other specified abnormal findings of blood chemistry: Secondary | ICD-10-CM

## 2021-02-03 DIAGNOSIS — R42 Dizziness and giddiness: Secondary | ICD-10-CM

## 2021-02-03 DIAGNOSIS — Z13220 Encounter for screening for lipoid disorders: Secondary | ICD-10-CM

## 2021-02-03 DIAGNOSIS — Z8679 Personal history of other diseases of the circulatory system: Secondary | ICD-10-CM | POA: Diagnosis not present

## 2021-02-03 LAB — TSH: TSH: 4.23 u[IU]/mL (ref 0.35–4.50)

## 2021-02-03 LAB — LIPID PANEL
Cholesterol: 179 mg/dL (ref 0–200)
HDL: 43.1 mg/dL (ref >39.00)
LDL Cholesterol: 114 mg/dL — ABNORMAL HIGH (ref 0–99)
NonHDL: 136.38
Total CHOL/HDL Ratio: 4
Triglycerides: 114 mg/dL (ref 0.0–149.0)
VLDL: 22.8 mg/dL (ref 0.0–40.0)

## 2021-02-03 LAB — CBC
HCT: 38.3 % (ref 36.0–46.0)
Hemoglobin: 13 g/dL (ref 12.0–15.0)
MCHC: 34 g/dL (ref 30.0–36.0)
MCV: 92 fl (ref 78.0–100.0)
Platelets: 226 10*3/uL (ref 150.0–400.0)
RBC: 4.16 Mil/uL (ref 3.87–5.11)
RDW: 14 % (ref 11.5–15.5)
WBC: 5.7 10*3/uL (ref 4.0–10.5)

## 2021-02-03 LAB — BASIC METABOLIC PANEL
BUN: 11 mg/dL (ref 6–23)
CO2: 29 mEq/L (ref 19–32)
Calcium: 9.6 mg/dL (ref 8.4–10.5)
Chloride: 104 mEq/L (ref 96–112)
Creatinine, Ser: 0.66 mg/dL (ref 0.40–1.20)
GFR: 101.71 mL/min (ref >60.00)
Glucose, Bld: 88 mg/dL (ref 70–99)
Potassium: 3.8 mEq/L (ref 3.5–5.1)
Sodium: 141 mEq/L (ref 135–145)

## 2021-02-03 LAB — T4, FREE: Free T4: 0.7 ng/dL (ref 0.60–1.60)

## 2021-02-03 NOTE — Progress Notes (Signed)
Chief Complaint  Patient presents with  . Hospitalization Follow-up    Pt here f  Pt here for ER visit, for Bp    HPI: Cindy Christian is a 50 y.o. female here for follow-up on her BP. She follows with cardio Dr. Audie Box and Dr. Curt Bears (EP cardio) and has a h/o a-fib s/p ablation in 03/2020. Her last OV with Dr. Curt Bears was in 08/2020 and f/u in 6 mo was recommended. CHADSVASC= 1. She is no longer on eliquis or amiodarone, as she once was.  Today pt reports she went to the ER on 5/2, had EKG which showed NSR at 80bpm. She left without being seen.  At work earlier that day, pt states she felt lightheadedness, cold/clammy, had to lay on ground. She felt her heart was beating fast but did not feel irregular. Symptoms improved after about 20 min but she still shaky.  She had eaten 3-4hrs prior to episode. Sleep is no different from baseline. Water intake 1 gal per day. Exercising regularly without issue.  Had similar episode in 08/2020.  She has appt with Dr. Curt Bears in 03/2021.  BP Readings from Last 3 Encounters:  02/03/21 119/80  01/30/21 (!) 116/94  09/22/20 116/70   Lab Results  Component Value Date   CREATININE 0.68 09/22/2020   BUN 10 09/22/2020   NA 139 09/22/2020   K 4.0 09/22/2020   CL 104 09/22/2020   CO2 30 09/22/2020    Past Medical History:  Diagnosis Date  . Allergy    seasonal allergies  . Atrial fibrillation with RVR ([place]) 03/25/2020   had ablation-NSR   . Borderline glaucoma    [place]    Past Surgical History:  Procedure Laterality Date  . ABLATION  03/25/2020  . ATRIAL FIBRILLATION ABLATION N/A 03/25/2020   Procedure: ATRIAL FIBRILLATION ABLATION;  Surgeon: Constance Haw, MD;  Location: [place];  Service: Cardiovascular;  Laterality: N/A;  . BUBBLE STUDY  12/30/2019   Procedure: BUBBLE STUDY;  Surgeon: Elouise Munroe, MD;  Location: [place];  Service: Cardiovascular;;  . CARDIOVERSION N/A 12/30/2019   Procedure: CARDIOVERSION;   Surgeon: Elouise Munroe, MD;  Location: [facility] ENDOSCOPY;  Service: Cardiovascular;  Laterality: N/A;  . FINGER FRACTURE SURGERY Left 1983   middle finger  . KNEE ARTHROPLASTY Bilateral 2000 R 2008 L  . LEEP  2004   CIN 1-2  . TEE WITHOUT CARDIOVERSION N/A 12/30/2019   Procedure: TRANSESOPHAGEAL ECHOCARDIOGRAM (TEE);  Surgeon: Elouise Munroe, MD;  Location: [place];  Service: Cardiovascular;  Laterality: N/A;  . WISDOM TOOTH EXTRACTION      Social History   Socioeconomic History  . Marital status: Single    Spouse name: Not on file  . Number of children: Not on file  . Years of education: Not on file  . Highest education level: Not on file  Occupational History  . Occupation: admin    Comment: Psychologist, counselling  Tobacco Use  . Smoking status: Former Smoker    Packs/day: 0.50    Years: 13.00    Pack years: 6.50    Quit date: 04/24/1998    Years since quitting: 22.7  . Smokeless tobacco: Never Used  Vaping Use  . Vaping Use: Never used  Substance and Sexual Activity  . Alcohol use: Not Currently  . Drug use: No  . Sexual activity: Yes    Partners: Male  Other Topics Concern  . Not on file  Social History Narrative  Walking daily   Social Determinants of Health   Financial Resource Strain: Not on file  Food Insecurity: Not on file  Transportation Needs: Not on file  Physical Activity: Not on file  Stress: Not on file  Social Connections: Not on file  Intimate Partner Violence: Not on file    Family History  Problem Relation Age of Onset  . Colon polyps Mother 72  . Diabetes Maternal Uncle        Type 1  . Stroke Maternal Grandmother   . Colon cancer Neg Hx   . Esophageal cancer Neg Hx   . Stomach cancer Neg Hx   . Rectal cancer Neg Hx      Immunization History  Administered Date(s) Administered  . PFIZER(Purple Top)SARS-COV-2 Vaccination 07/03/2020, 07/24/2020    Outpatient Encounter Medications as of 02/03/2021  Medication Sig  . OVER THE  COUNTER MEDICATION Place 2 drops into both eyes daily. Simalasin Allergy Eye Relief - homeopathic  . [DISCONTINUED] ascorbic acid (VITAMIN C) 500 MG tablet Take 500 mg by mouth daily.   . [DISCONTINUED] b complex vitamins tablet Take 1 tablet by mouth daily.  . [DISCONTINUED] MAGNESIUM CARBONATE PO Take by mouth daily.  . [DISCONTINUED] MAGNESIUM GLYCINATE PO Take by mouth daily.  . [DISCONTINUED] MILK THISTLE-DAND-FENNEL-LICOR PO Take by mouth daily.  . [DISCONTINUED] POTASSIUM GLUCONATE PO Take 175 mg by mouth daily.  . [DISCONTINUED] VITAMIN D PO Take by mouth daily.  . [DISCONTINUED] Zinc 50 MG CAPS Take 50 mg by mouth daily.   No facility-administered encounter medications on file as of 02/03/2021.     ROS: Pertinent positives and negatives noted in HPI. Remainder of ROS non-contributory   Allergies  Allergen Reactions  . Penicillins Other (See Comments)    Childhood allergy Did it involve swelling of the face/tongue/throat, SOB, or low BP? Unknown Did it involve sudden or severe rash/hives, skin peeling, or any reaction on the inside of your mouth or nose? Unknown Did you need to seek medical attention at a hospital or doctor's office? Unknown When did it last happen?young child If all above answers are "NO", may proceed with cephalosporin use.    BP 119/80 (BP Location: Left Arm, Patient Position: Sitting, Cuff Size: Normal)   Pulse 90   Temp 98.4 F (36.9 C) (Oral)   Ht 5' 3.75" (1.619 m)   Wt 165 lb (74.8 kg)   SpO2 98%   BMI 28.54 kg/m   Wt Readings from Last 3 Encounters:  02/03/21 165 lb (74.8 kg)  01/30/21 173 lb 11.6 oz (78.8 kg)  09/22/20 173 lb 12.8 oz (78.8 kg)   Temp Readings from Last 3 Encounters:  02/03/21 98.4 F (36.9 C) (Oral)  01/30/21 98.1 F (36.7 C) (Oral)  09/22/20 97.8 F (36.6 C) (Temporal)   BP Readings from Last 3 Encounters:  02/03/21 119/80  01/30/21 (!) 116/94  09/22/20 116/70   Pulse Readings from Last 3 Encounters:   02/03/21 90  01/30/21 79  09/22/20 77    Physical Exam Constitutional:      General: She is not in acute distress.    Appearance: Normal appearance. She is not ill-appearing.  Cardiovascular:     Rate and Rhythm: Normal rate and regular rhythm.     Pulses: Normal pulses.  Pulmonary:     Effort: Pulmonary effort is normal. No respiratory distress.     Breath sounds: No wheezing or rhonchi.  Musculoskeletal:     Right lower leg: No edema.  Left lower leg: No edema.  Neurological:     Mental Status: She is alert and oriented to person, place, and time.  Psychiatric:        Mood and Affect: Mood normal.        Behavior: Behavior normal.      A/P:  1. Elevated TSH - TSH - T4, free  2. History of atrial fibrillation - s/p ablation in 03/2020 - next cardio appt in 10/1939 - Basic metabolic panel - CBC  3. Episodic lightheadedness - 2 in the past 6 mo - otherwise she is exercising without issue, eating well, sleeping well, drinking plenty of water - normal EKG in ER on 5/2 - labs today     This visit occurred during the SARS-CoV-2 public health emergency.  Safety protocols were in place, including screening questions prior to the visit, additional usage of staff PPE, and extensive cleaning of exam room while observing appropriate contact time as indicated for disinfecting solutions.

## 2021-04-12 ENCOUNTER — Ambulatory Visit: Payer: 59 | Admitting: Cardiovascular Disease

## 2021-04-20 ENCOUNTER — Other Ambulatory Visit: Payer: Self-pay

## 2021-04-20 ENCOUNTER — Encounter: Payer: Self-pay | Admitting: Cardiology

## 2021-04-20 ENCOUNTER — Ambulatory Visit (INDEPENDENT_AMBULATORY_CARE_PROVIDER_SITE_OTHER): Payer: 59 | Admitting: Cardiology

## 2021-04-20 VITALS — BP 116/84 | HR 77 | Ht 64.0 in | Wt 163.0 lb

## 2021-04-20 DIAGNOSIS — I4819 Other persistent atrial fibrillation: Secondary | ICD-10-CM

## 2021-04-20 NOTE — Patient Instructions (Signed)
Medication Instructions:  °Your physician recommends that you continue on your current medications as directed. Please refer to the Current Medication list given to you today. ° °*If you need a refill on your cardiac medications before your next appointment, please call your pharmacy* ° ° °Lab Work: °None ordered ° ° °Testing/Procedures: °None ordered ° ° °Follow-Up: °At [facility], you and your health needs are our priority.  As part of our continuing mission to provide you with exceptional heart care, we have created designated Provider Care Teams.  These Care Teams include your primary Cardiologist (physician) and Advanced Practice Providers (APPs -  Physician Assistants and Nurse Practitioners) who all work together to provide you with the care you need, when you need it. ° °Your next appointment:   °1 year(s) ° °The format for your next appointment:   °In Person ° °Provider:   °Will [name], MD ° ° ° °Thank you for choosing [facility]!! ° ° °Jozlin Bently, RN °[phone] ° ° ° °

## 2021-04-20 NOTE — Progress Notes (Signed)
Electrophysiology Office Note   Date:  04/20/2021   ID:  Cindy Christian, DOB [birth year], MRN [id number]  PCP:  Cindy Nian, DO  Cardiologist:  [id number] Primary Electrophysiologist:  Cindy Rothlisberger Meredith Leeds, MD    Chief Complaint: AF   History of Present Illness: Cindy Christian is a 50 y.o. female who is being seen today for the evaluation of AF at the request of Cindy Christian, Cindy Fila, DO. Presenting today for electrophysiology evaluation.  She initially presented to [facility] 12/26/2019 with 5 weeks with shortness of breath, abdominal bloating, and palpitations.  She was found to be in rapid atrial fibrillation.  She had a cardioversion and quickly went back into atrial fibrillation.  She was loaded on flecainide with repeat cardioversion back to normal rhythm.  She had cardiology clinic and was noted to be back in atrial fibrillation was loaded on amiodarone.  She is status post AF ablation 03/25/2020.  Today, denies symptoms of palpitations, chest pain, shortness of breath, orthopnea, PND, lower extremity edema, claudication, dizziness, presyncope, syncope, bleeding, or neurologic sequela. The patient is tolerating medications without difficulties.  Since being seen she has done well.  She is noted no further episodes of atrial fibrillation.  She is continued to exercise and eat healthy.  She has lost quite a bit of weight and feels much improved.   Past Medical History:  Diagnosis Date   Allergy    seasonal allergies   Atrial fibrillation with RVR ([place]) 03/25/2020   had ablation-NSR    Borderline glaucoma    popa   Past Surgical History:  Procedure Laterality Date   ABLATION  03/25/2020   ATRIAL FIBRILLATION ABLATION N/A 03/25/2020   Procedure: ATRIAL FIBRILLATION ABLATION;  Surgeon: Constance Haw, MD;  Location: [place] [facility];  Service: Cardiovascular;  Laterality: N/A;   BUBBLE STUDY  12/30/2019   Procedure: BUBBLE STUDY;  Surgeon: Elouise Munroe, MD;   Location: [place];  Service: Cardiovascular;;   CARDIOVERSION N/A 12/30/2019   Procedure: CARDIOVERSION;  Surgeon: Elouise Munroe, MD;  Location: [facility] ENDOSCOPY;  Service: Cardiovascular;  Laterality: N/A;   FINGER FRACTURE SURGERY Left 1983   middle finger   KNEE ARTHROPLASTY Bilateral 2000 R 2008 L   LEEP  2004   CIN 1-2   TEE WITHOUT CARDIOVERSION N/A 12/30/2019   Procedure: TRANSESOPHAGEAL ECHOCARDIOGRAM (TEE);  Surgeon: Elouise Munroe, MD;  Location: [facility] ENDOSCOPY;  Service: Cardiovascular;  Laterality: N/A;   WISDOM TOOTH EXTRACTION       Current Outpatient Medications  Medication Sig Dispense Refill   OVER THE COUNTER MEDICATION Place 2 drops into both eyes daily. Simalasin Allergy Eye Relief - homeopathic     No current facility-administered medications for this visit.    Allergies:   Penicillins   Social History:  The patient  reports that she quit smoking about 23 years ago. She has a 6.50 pack-year smoking history. She has never used smokeless tobacco. She reports previous alcohol use. She reports that she does not use drugs.   Family History:  The patient's family history includes Colon polyps (age of onset: 53) in her mother; Diabetes in her maternal uncle; Stroke in her maternal grandmother.   ROS:  Please see the history of present illness.   Otherwise, review of systems is positive for none.   All other systems are reviewed and negative.   PHYSICAL EXAM: VS:  BP 116/84   Pulse 77   Ht 5\' 4"  (1.626 m)  Wt 163 lb (73.9 kg)   BMI 27.98 kg/m  , BMI Body mass index is 27.98 kg/m. GEN: Well nourished, well developed, in no acute distress  HEENT: normal  Neck: no JVD, carotid bruits, or masses Cardiac: RRR; no murmurs, rubs, or gallops,no edema  Respiratory:  clear to auscultation bilaterally, normal work of breathing GI: soft, nontender, nondistended, + BS MS: no deformity or atrophy  Skin: warm and dry Neuro:  Strength and sensation are intact Psych:  euthymic mood, full affect  EKG:  EKG is ordered today. Personal review of the ekg ordered shows sinus rhythm, rate 77  Recent Labs: 09/22/2020: ALT 20 02/03/2021: BUN 11; Creatinine, Ser 0.66; Hemoglobin 13.0; Platelets 226.0; Potassium 3.8; Sodium 141; TSH 4.23    Lipid Panel     Component Value Date/Time   CHOL 179 02/03/2021 0949   TRIG 114.0 02/03/2021 0949   HDL 43.10 02/03/2021 0949   CHOLHDL 4 02/03/2021 0949   VLDL 22.8 02/03/2021 0949   LDLCALC 114 (H) 02/03/2021 0949     Wt Readings from Last 3 Encounters:  04/20/21 163 lb (73.9 kg)  02/03/21 165 lb (74.8 kg)  01/30/21 173 lb 11.6 oz (78.8 kg)      Other studies Reviewed: Additional studies/ records that were reviewed today include: TTE 12/28/19  Review of the above records today demonstrates:   1. Technically difficult study due to atrial fibrillation and poor  windows. Grossly normal LV systolic function. Left ventricular ejection  fraction, by estimation, is 55 to 60%. The left ventricle has normal  function. The left ventricle has no regional  wall motion abnormalities. There is mild left ventricular hypertrophy.  Left ventricular diastolic parameters are indeterminate.   2. Right ventricular systolic function is normal. The right ventricular  size is mildly enlarged.   3. Right atrial size was mildly dilated.   4. The mitral valve is normal in structure. Mild mitral valve  regurgitation.   5. The aortic valve was not well visualized. Aortic valve regurgitation  is not visualized. AV gradients were not measured    ASSESSMENT AND PLAN:  1.  Persistent atrial fibrillation: Status post ablation 03/25/2020.  CHA2DS2-VASc of 1.  Amiodarone and Eliquis have both been stopped.  She currently feels well.  She is continued to exercise and lose weight.  We Cindy Christian continue with current management.  2.  Obesity: Diet and exercise encouraged.  Has had a negative sleep study.   Current medicines are reviewed at length  with the patient today.   The patient does not have concerns regarding her medicines.  The following changes were made today: None  Labs/ tests ordered today include:  Orders Placed This Encounter  Procedures   EKG 12-Lead      Disposition:   FU with Yusuf Yu 12 months  Signed, Amillion Scobee Meredith Leeds, MD  04/20/2021 9:50 AM     [address] [place] [phone] (office) [phone] (fax)

## 2021-10-06 ENCOUNTER — Ambulatory Visit (INDEPENDENT_AMBULATORY_CARE_PROVIDER_SITE_OTHER): Payer: 59 | Admitting: Nurse Practitioner

## 2021-10-06 ENCOUNTER — Other Ambulatory Visit: Payer: Self-pay

## 2021-10-06 ENCOUNTER — Other Ambulatory Visit (HOSPITAL_COMMUNITY)
Admission: RE | Admit: 2021-10-06 | Discharge: 2021-10-06 | Disposition: A | Discharge status: Home / Self Care | Payer: 59 | Source: Ambulatory Visit | Attending: Nurse Practitioner | Admitting: Nurse Practitioner

## 2021-10-06 ENCOUNTER — Encounter: Payer: Self-pay | Admitting: Nurse Practitioner

## 2021-10-06 VITALS — BP 112/60 | HR 88 | Temp 97.5°F | Ht 63.75 in | Wt 157.0 lb

## 2021-10-06 DIAGNOSIS — Z136 Encounter for screening for cardiovascular disorders: Secondary | ICD-10-CM | POA: Diagnosis not present

## 2021-10-06 DIAGNOSIS — Z13220 Encounter for screening for lipoid disorders: Secondary | ICD-10-CM

## 2021-10-06 DIAGNOSIS — Z1231 Encounter for screening mammogram for malignant neoplasm of breast: Secondary | ICD-10-CM | POA: Diagnosis not present

## 2021-10-06 DIAGNOSIS — Z0001 Encounter for general adult medical examination with abnormal findings: Secondary | ICD-10-CM

## 2021-10-06 DIAGNOSIS — Z124 Encounter for screening for malignant neoplasm of cervix: Secondary | ICD-10-CM

## 2021-10-06 DIAGNOSIS — I4819 Other persistent atrial fibrillation: Secondary | ICD-10-CM

## 2021-10-06 DIAGNOSIS — R7989 Other specified abnormal findings of blood chemistry: Secondary | ICD-10-CM | POA: Diagnosis not present

## 2021-10-06 DIAGNOSIS — Z8639 Personal history of other endocrine, nutritional and metabolic disease: Secondary | ICD-10-CM | POA: Insufficient documentation

## 2021-10-06 DIAGNOSIS — E069 Thyroiditis, unspecified: Secondary | ICD-10-CM | POA: Insufficient documentation

## 2021-10-06 LAB — CBC WITH DIFFERENTIAL/PLATELET
Basophils Absolute: 0 10*3/uL (ref 0.0–0.1)
Basophils Relative: 0.8 % (ref 0.0–3.0)
Eosinophils Absolute: 0.2 10*3/uL (ref 0.0–0.7)
Eosinophils Relative: 4.2 % (ref 0.0–5.0)
HCT: 38.4 % (ref 36.0–46.0)
Hemoglobin: 12.5 g/dL (ref 12.0–15.0)
Lymphocytes Relative: 43.3 % (ref 12.0–46.0)
Lymphs Abs: 2 10*3/uL (ref 0.7–4.0)
MCHC: 32.6 g/dL (ref 30.0–36.0)
MCV: 89.7 fl (ref 78.0–100.0)
Monocytes Absolute: 0.5 10*3/uL (ref 0.1–1.0)
Monocytes Relative: 10.2 % (ref 3.0–12.0)
Neutro Abs: 1.9 10*3/uL (ref 1.4–7.7)
Neutrophils Relative %: 41.5 % — ABNORMAL LOW (ref 43.0–77.0)
Platelets: 263 10*3/uL (ref 150.0–400.0)
RBC: 4.28 Mil/uL (ref 3.87–5.11)
RDW: 13.8 % (ref 11.5–15.5)
WBC: 4.6 10*3/uL (ref 4.0–10.5)

## 2021-10-06 LAB — COMPREHENSIVE METABOLIC PANEL
ALT: 21 U/L (ref 0–35)
AST: 23 U/L (ref 0–37)
Albumin: 4.1 g/dL (ref 3.5–5.2)
Alkaline Phosphatase: 53 U/L (ref 39–117)
BUN: 10 mg/dL (ref 6–23)
CO2: 29 mEq/L (ref 19–32)
Calcium: 9.4 mg/dL (ref 8.4–10.5)
Chloride: 105 mEq/L (ref 96–112)
Creatinine, Ser: 0.52 mg/dL (ref 0.40–1.20)
GFR: 107.22 mL/min (ref >60.00)
Glucose, Bld: 79 mg/dL (ref 70–99)
Potassium: 3.8 mEq/L (ref 3.5–5.1)
Sodium: 140 mEq/L (ref 135–145)
Total Bilirubin: 0.6 mg/dL (ref 0.2–1.2)
Total Protein: 7 g/dL (ref 6.0–8.3)

## 2021-10-06 LAB — LIPID PANEL
Cholesterol: 140 mg/dL (ref 0–200)
HDL: 51 mg/dL (ref >39.00)
LDL Cholesterol: 75 mg/dL (ref 0–99)
NonHDL: 88.57
Total CHOL/HDL Ratio: 3
Triglycerides: 68 mg/dL (ref 0.0–149.0)
VLDL: 13.6 mg/dL (ref 0.0–40.0)

## 2021-10-06 LAB — TSH: TSH: <0.01 u[IU]/mL — ABNORMAL LOW (ref 0.35–5.50)

## 2021-10-06 LAB — T4, FREE: Free T4: 1.07 ng/dL (ref 0.60–1.60)

## 2021-10-06 NOTE — Assessment & Plan Note (Signed)
>>  ASSESSMENT AND PLAN FOR ABNORMAL TSH WRITTEN ON 10/06/2021  3:00 PM BY Carlyle Mcelrath LUM, NP  Repeat TSH and T4

## 2021-10-06 NOTE — Assessment & Plan Note (Addendum)
Onset 2021 Under the care of Dr. Audie Box (cardiology) and Dr. Carmnitz(electrophysiologist), last OV 03/2021. Hx of cardioversion 11/2019 and ablation 03/2020 previous use of amiodarone and eliquis. meds discontinued due to low CHADsVasc score and conversion to NSR. No anticoagulant at this time. She reports intermittent palpitations (last about 1-3hrs), no known trigger, last episode associated with weakness. No syncope.  Normal heart rhythm and rate today Check cbc, TSH, CMP today Advised to schedule another appt with Dr. Curt Bears.

## 2021-10-06 NOTE — Patient Instructions (Signed)
You will be contacted to schedule appt for mammogram Schedule appt with Dr. Curt Bears Go to lab for blood draw  Preventive Care 52-52 Years Old, Female Female Preventive care refers to lifestyle choices and visits with your health care provider that can promote health and wellness. Preventive care visits are also called wellness exams. What can I expect for my preventive care visit? Counseling Your health care provider may ask you questions about your: Medical history, including: Past medical problems. Family medical history. Pregnancy history. Current health, including: Menstrual cycle. Method of birth control. Emotional well-being. Home life and relationship well-being. Sexual activity and sexual health. Lifestyle, including: Alcohol, nicotine or tobacco, and drug use. Access to firearms. Diet, exercise, and sleep habits. Work and work Statistician. Sunscreen use. Safety issues such as seatbelt and bike helmet use. Physical exam Your health care provider will check your: Height and weight. These may be used to calculate your BMI (body mass index). BMI is a measurement that tells if you are at a healthy weight. Waist circumference. This measures the distance around your waistline. This measurement also tells if you are at a healthy weight and may help predict your risk of certain diseases, such as type 2 diabetes and high blood pressure. Heart rate and blood pressure. Body temperature. Skin for abnormal spots. What immunizations do I need? Vaccines are usually given at various ages, according to a schedule. Your health care provider will recommend vaccines for you based on your age, medical history, and lifestyle or other factors, such as travel or where you work. What tests do I need? Screening Your health care provider may recommend screening tests for certain conditions. This may include: Lipid and cholesterol levels. Diabetes screening. This is done by checking your blood sugar  (glucose) after you have not eaten for a while (fasting). Pelvic exam and Pap test. Hepatitis B test. Hepatitis C test. HIV (human immunodeficiency virus) test. STI (sexually transmitted infection) testing, if you are at risk. Lung cancer screening. Colorectal cancer screening. Mammogram. Talk with your health care provider about when you should start having regular mammograms. This may depend on whether you have a family history of breast cancer. BRCA-related cancer screening. This may be done if you have a family history of breast, ovarian, tubal, or peritoneal cancers. Bone density scan. This is done to screen for osteoporosis. Talk with your health care provider about your test results, treatment options, and if necessary, the need for more tests. Follow these instructions at home: Eating and drinking  Eat a diet that includes fresh fruits and vegetables, whole grains, lean protein, and low-fat dairy products. Take vitamin and mineral supplements as recommended by your health care provider. Do not drink alcohol if: Your health care provider tells you not to drink. You are pregnant, may be pregnant, or are planning to become pregnant. If you drink alcohol: Limit how much you have to 52-1 drink a day. Know how much alcohol is in your drink. In the [place] one drink equals one 12 oz bottle of beer (355 mL), one 5 oz glass of wine (148 mL), or one 1 oz glass of hard liquor (44 mL). Lifestyle Brush your teeth every morning and night with fluoride toothpaste. Floss one time each day. Exercise for at least 30 minutes 5 or more days each week. Do not use any products that contain nicotine or tobacco. These products include cigarettes, chewing tobacco, and vaping devices, such as e-cigarettes. If you need help quitting, ask your health care provider. Do  not use drugs. °If you are sexually active, practice safe sex. Use a condom or other form of protection to prevent STIs. °If you do not wish to  become pregnant, use a form of birth control. If you plan to become pregnant, see your health care provider for a prepregnancy visit. °Take aspirin only as told by your health care provider. Make sure that you understand how much to take and what form to take. Work with your health care provider to find out whether it is safe and beneficial for you to take aspirin daily. °Find healthy ways to manage stress, such as: °Meditation, yoga, or listening to music. °Journaling. °Talking to a trusted person. °Spending time with friends and family. °Minimize exposure to UV radiation to reduce your risk of skin cancer. °Safety °Always wear your seat belt while driving or riding in a vehicle. °Do not drive: °If you have been drinking alcohol. Do not ride with someone who has been drinking. °When you are tired or distracted. °While texting. °If you have been using any mind-altering substances or drugs. °Wear a helmet and other protective equipment during sports activities. °If you have firearms in your house, make sure you follow all gun safety procedures. °Seek help if you have been physically or sexually abused. °What's next? °Visit your health care provider once a year for an annual wellness visit. °Ask your health care provider how often you should have your eyes and teeth checked. °Stay up to date on all vaccines. °This information is not intended to replace advice given to you by your health care provider. Make sure you discuss any questions you have with your health care provider. °Document Revised: 03/15/2021 Document Reviewed: 03/15/2021 °Elsevier Patient Education © 2022 Elsevier Inc. ° °

## 2021-10-06 NOTE — Progress Notes (Signed)
Subjective:    Patient ID: Cindy Christian, female    DOB: 05-19-1971, 51 y.o.   MRN: [id number]  Patient presents today for CPE and eval of chronic conditions  HPI Atrial fibrillation ([place]) Onset 2021 Under the care of Dr. Audie Box (cardiology) and Dr. Carmnitz(electrophysiologist), last OV 03/2021. Hx of cardioversion  previous use of amiodarone and eliquis. meds discontinued due to low CHADsVasc score and conversion to NSR. No anticoagulant at this time. She reports intermittent palpitations (last about 1-3hrs), no known trigger, last episode associated with weakness. No syncope.  Normal heart rhythm and rate today Check cbc, TSH, CMP today Advised to schedule another appt with Dr. Curt Bears.  Abnormal TSH Repeat TSH and T4  Vision:up to date Dental:up to date Diet:heart health Exercise:4x/week  Weight:  Wt Readings from Last 3 Encounters:  10/06/21 157 lb (71.2 kg)  04/20/21 163 lb (73.9 kg)  02/03/21 165 lb (74.8 kg)    Sexual History (orientation,birth control, marital status, STD):needs pelvic and breast exam today, no need for STD screen  Depression/Suicide: Depression screen [facility] 2/9 10/06/2021 10/29/2019  Decreased Interest 0 0  Down, Depressed, Hopeless 0 0  PHQ - 2 Score 0 0  Altered sleeping 1 -  Tired, decreased energy 1 -  Change in appetite 0 -  Feeling bad or failure about yourself  0 -  Trouble concentrating 0 -  Moving slowly or fidgety/restless 0 -  Suicidal thoughts 0 -  PHQ-9 Score 2 -  Difficult doing work/chores Not difficult at all -   Immunizations: (TDAP, Hep C screen, Pneumovax, Influenza, zoster)  Health Maintenance  Topic Date Due   COVID-19 Vaccine (3 - Booster for Pfizer series) 09/18/2020   Flu Shot  12/29/2021*   Zoster (Shingles) Vaccine (1 of 2) 01/04/2022*   Tetanus Vaccine  10/06/2022*   Hepatitis C Screening: USPSTF Recommendation to screen - Ages 18-79 yo.  10/06/2022*   Mammogram  10/28/2021   Pap Smear  10/28/2022   Colon  Cancer Screening  08/12/2030   HIV Screening  Completed   Pneumococcal Vaccination  Aged Out   HPV Vaccine  Aged Out  *Topic was postponed. The date shown is not the original due date.   Fall Risk: Fall Risk  10/06/2021  Falls in the past year? 0  Number falls in past yr: 0  Injury with Fall? 0  Risk for fall due to : No Fall Risks  Follow up Falls evaluation completed   Medications and allergies reviewed with patient and updated if appropriate.  Patient Active Problem List   Diagnosis Date Noted   Abnormal TSH 10/06/2021   Atrial fibrillation ([place]) 12/26/2019    Current Outpatient Medications on File Prior to Visit  Medication Sig Dispense Refill   OVER THE COUNTER MEDICATION Place 2 drops into both eyes daily. Simalasin Allergy Eye Relief - homeopathic     No current facility-administered medications on file prior to visit.    Past Medical History:  Diagnosis Date   Allergy    seasonal allergies   Atrial fibrillation with RVR ([place]) 03/25/2020   had ablation-NSR    Borderline glaucoma    popa    Past Surgical History:  Procedure Laterality Date   ABLATION  03/25/2020   ATRIAL FIBRILLATION ABLATION N/A 03/25/2020   Procedure: ATRIAL FIBRILLATION ABLATION;  Surgeon: Constance Haw, MD;  Location: [place] [facility];  Service: Cardiovascular;  Laterality: N/A;   BUBBLE STUDY  12/30/2019   Procedure: BUBBLE STUDY;  Surgeon:  Elouise Munroe, MD;  Location: [place];  Service: Cardiovascular;;   CARDIOVERSION N/A 12/30/2019   Procedure: CARDIOVERSION;  Surgeon: Elouise Munroe, MD;  [facility] ENDOSCOPY;  Service: Cardiovascular;  Laterality: N/A;   FINGER FRACTURE SURGERY Left 1983   middle finger   KNEE ARTHROPLASTY Bilateral 2000 R 2008 L   LEEP  2004   CIN 1-2   TEE WITHOUT CARDIOVERSION N/A 12/30/2019   Procedure: TRANSESOPHAGEAL ECHOCARDIOGRAM (TEE);  Surgeon: Elouise Munroe, MD;  Location: [facility] ENDOSCOPY;  Service: Cardiovascular;  Laterality:  N/A;   WISDOM TOOTH EXTRACTION      Social History   Socioeconomic History   Marital status: Single    Spouse name: Not on file   Number of children: Not on file   Years of education: Not on file   Highest education level: Not on file  Occupational History   Occupation: admin    Comment: Psychologist, counselling  Tobacco Use   Smoking status: Former    Packs/day: 0.25    Years: 13.00    Pack years: 3.25    Types: Cigarettes    Quit date: 04/24/1998    Years since quitting: 23.4   Smokeless tobacco: Never  Vaping Use   Vaping Use: Never used  Substance and Sexual Activity   Alcohol use: Not Currently   Drug use: No   Sexual activity: Yes    Partners: Male    Birth control/protection: None  Other Topics Concern   Not on file  Social History Narrative   Walking daily   Social Determinants of Health   Financial Resource Strain: Not on file  Food Insecurity: Not on file  Transportation Needs: Not on file  Physical Activity: Not on file  Stress: Not on file  Social Connections: Not on file   Family History  Problem Relation Age of Onset   Colon polyps Mother 72   Diabetes Maternal Uncle        Type 1   Stroke Maternal Grandmother    Colon cancer Neg Hx    Esophageal cancer Neg Hx    Stomach cancer Neg Hx    Rectal cancer Neg Hx        Review of Systems  Constitutional:  Negative for fever, malaise/fatigue and weight loss.  HENT:  Negative for congestion and sore throat.   Eyes:        Negative for visual changes  Respiratory:  Negative for cough and shortness of breath.   Cardiovascular:  Negative for chest pain, palpitations and leg swelling.  Gastrointestinal:  Negative for blood in stool, constipation, diarrhea and heartburn.  Genitourinary:  Negative for dysuria, frequency and urgency.  Musculoskeletal:  Negative for falls, joint pain and myalgias.  Skin:  Negative for rash.  Neurological:  Negative for dizziness, sensory change and headaches.   Endo/Heme/Allergies:  Does not bruise/bleed easily.  Psychiatric/Behavioral:  Negative for depression, substance abuse and suicidal ideas. The patient is not nervous/anxious.    Objective:   Vitals:   10/06/21 0935  BP: 112/60  Pulse: 88  Temp: (!) 97.5 F (36.4 C)  SpO2: 100%   Body mass index is 27.16 kg/m.  Physical Examination:  Physical Exam Vitals reviewed. Exam conducted with a chaperone present.  Constitutional:      General: She is not in acute distress. HENT:     Right Ear: Tympanic membrane, ear canal and external ear normal.     Left Ear: Tympanic membrane, ear canal and external ear normal.  Eyes:     General: No scleral icterus.    Extraocular Movements: Extraocular movements intact.     Conjunctiva/sclera: Conjunctivae normal.  Cardiovascular:     Rate and Rhythm: Normal rate and regular rhythm.     Pulses: Normal pulses.     Heart sounds: Normal heart sounds.  Pulmonary:     Effort: Pulmonary effort is normal. No respiratory distress.     Breath sounds: Normal breath sounds.  Chest:  Breasts:    Right: Normal.     Left: Normal.  Abdominal:     General: Bowel sounds are normal. There is no distension.     Palpations: Abdomen is soft.     Hernia: There is no hernia in the left inguinal area or right inguinal area.  Genitourinary:    General: Normal vulva.     Exam position: Lithotomy position.     Labia:        Right: No rash, tenderness or lesion.        Left: No rash, tenderness or lesion.      Vagina: No vaginal discharge.     Rectum: Normal.  Musculoskeletal:        General: Normal range of motion.     Cervical back: Normal range of motion and neck supple.  Lymphadenopathy:     Cervical: No cervical adenopathy.     Upper Body:     Right upper body: No supraclavicular, axillary or pectoral adenopathy.     Left upper body: No supraclavicular, axillary or pectoral adenopathy.     Lower Body: No right inguinal adenopathy. No left inguinal  adenopathy.  Skin:    General: Skin is warm and dry.  Neurological:     Mental Status: She is alert and oriented to person, place, and time.  Psychiatric:        Mood and Affect: Mood normal.        Behavior: Behavior normal.        Thought Content: Thought content normal.   ASSESSMENT and PLAN: This visit occurred during the SARS-CoV-2 public health emergency.  Safety protocols were in place, including screening questions prior to the visit, additional usage of staff PPE, and extensive cleaning of exam room while observing appropriate contact time as indicated for disinfecting solutions.   Clairessa was seen today for establish care.  Diagnoses and all orders for this visit:  Encounter for preventative adult health care exam with abnormal findings -     CBC with Differential/Platelet -     Comprehensive metabolic panel -     Lipid panel -     Cytology - PAP -     MM 3D SCREEN BREAST BILATERAL; Future  Abnormal TSH -     T4, free -     TSH  Encounter for lipid screening for cardiovascular disease -     Lipid panel  Encounter for Papanicolaou smear for cervical cancer screening -     Cytology - PAP  Breast cancer screening by mammogram -     MM 3D SCREEN BREAST BILATERAL; Future  Persistent atrial fibrillation ([place])      Problem List Items Addressed This Visit       Cardiovascular and Mediastinum   Atrial fibrillation ([place])    Onset 2021 Under the care of Dr. Audie Box (cardiology) and Dr. Carmnitz(electrophysiologist), last OV 03/2021. Hx of cardioversion  previous use of amiodarone and eliquis. meds discontinued due to low CHADsVasc score and conversion to NSR. No anticoagulant at this  time. She reports intermittent palpitations (last about 1-3hrs), no known trigger, last episode associated with weakness. No syncope.  Normal heart rhythm and rate today Check cbc, TSH, CMP today Advised to schedule another appt with Dr. Curt Bears.        Other   Abnormal TSH     Repeat TSH and T4      Relevant Orders   T4, free   TSH   Other Visit Diagnoses     Encounter for preventative adult health care exam with abnormal findings    -  Primary   Relevant Orders   CBC with Differential/Platelet   Comprehensive metabolic panel   Lipid panel   Cytology - PAP   MM 3D SCREEN BREAST BILATERAL   Encounter for lipid screening for cardiovascular disease       Relevant Orders   Lipid panel   Encounter for Papanicolaou smear for cervical cancer screening       Relevant Orders   Cytology - PAP   Breast cancer screening by mammogram       Relevant Orders   MM 3D SCREEN BREAST BILATERAL        Follow up: Return in about 1 year (around 10/06/2022) for CPE (fasting).  Wilfred Lacy, NP

## 2021-10-06 NOTE — Assessment & Plan Note (Signed)
Repeat TSH and T4 °

## 2021-10-06 NOTE — Addendum Note (Signed)
Addended by: Leana Gamer on: 10/06/2021 04:13 PM   Modules accepted: Orders

## 2021-10-10 LAB — CYTOLOGY - PAP
Comment: NEGATIVE
Diagnosis: NEGATIVE
Diagnosis: REACTIVE
High risk HPV: NEGATIVE

## 2021-10-11 ENCOUNTER — Other Ambulatory Visit (HOSPITAL_BASED_OUTPATIENT_CLINIC_OR_DEPARTMENT_OTHER): Payer: Self-pay | Admitting: Family Medicine

## 2021-10-11 DIAGNOSIS — Z1231 Encounter for screening mammogram for malignant neoplasm of breast: Secondary | ICD-10-CM

## 2021-10-13 ENCOUNTER — Encounter (HOSPITAL_BASED_OUTPATIENT_CLINIC_OR_DEPARTMENT_OTHER): Payer: Self-pay

## 2021-10-13 ENCOUNTER — Other Ambulatory Visit: Payer: Self-pay

## 2021-10-13 ENCOUNTER — Ambulatory Visit (HOSPITAL_BASED_OUTPATIENT_CLINIC_OR_DEPARTMENT_OTHER)
Admission: RE | Admit: 2021-10-13 | Discharge: 2021-10-13 | Disposition: A | Discharge status: Home / Self Care | Payer: 59 | Source: Ambulatory Visit | Attending: Nurse Practitioner | Admitting: Nurse Practitioner

## 2021-10-13 DIAGNOSIS — Z1231 Encounter for screening mammogram for malignant neoplasm of breast: Secondary | ICD-10-CM | POA: Insufficient documentation

## 2021-10-13 DIAGNOSIS — Z0001 Encounter for general adult medical examination with abnormal findings: Secondary | ICD-10-CM | POA: Diagnosis present

## 2021-10-20 ENCOUNTER — Other Ambulatory Visit: Payer: Self-pay

## 2021-10-20 ENCOUNTER — Other Ambulatory Visit (INDEPENDENT_AMBULATORY_CARE_PROVIDER_SITE_OTHER): Payer: 59

## 2021-10-20 DIAGNOSIS — R7989 Other specified abnormal findings of blood chemistry: Secondary | ICD-10-CM

## 2021-10-20 DIAGNOSIS — E0590 Thyrotoxicosis, unspecified without thyrotoxic crisis or storm: Secondary | ICD-10-CM

## 2021-10-23 LAB — THYROID PANEL WITH TSH
Free Thyroxine Index: 1.1 — ABNORMAL LOW (ref 1.4–3.8)
T3 Uptake: 28 % (ref 22–35)
T4, Total: 4.1 ug/dL — ABNORMAL LOW (ref 5.1–11.9)
TSH: 0.31 mIU/L — ABNORMAL LOW

## 2021-10-23 LAB — THYROID PEROXIDASE ANTIBODIES (TPO) (REFL): Thyroperoxidase Ab SerPl-aCnc: 1 IU/mL (ref <9)

## 2021-10-23 NOTE — Addendum Note (Signed)
Addended by: Wilfred Lacy L on: 10/23/2021 12:03 PM   Modules accepted: Orders

## 2021-11-01 ENCOUNTER — Telehealth: Payer: Self-pay | Admitting: Internal Medicine

## 2021-11-01 NOTE — Telephone Encounter (Signed)
Pt called to check the status of her referral did not see anything in the system.

## 2021-12-08 ENCOUNTER — Encounter: Payer: Self-pay | Admitting: Internal Medicine

## 2021-12-08 ENCOUNTER — Encounter: Payer: Self-pay | Admitting: Cardiovascular Disease

## 2021-12-08 ENCOUNTER — Other Ambulatory Visit: Payer: Self-pay

## 2021-12-08 ENCOUNTER — Ambulatory Visit (INDEPENDENT_AMBULATORY_CARE_PROVIDER_SITE_OTHER): Payer: 59 | Admitting: Internal Medicine

## 2021-12-08 VITALS — BP 130/72 | HR 80 | Ht 63.75 in | Wt 166.0 lb

## 2021-12-08 DIAGNOSIS — E0590 Thyrotoxicosis, unspecified without thyrotoxic crisis or storm: Secondary | ICD-10-CM | POA: Diagnosis not present

## 2021-12-08 DIAGNOSIS — E069 Thyroiditis, unspecified: Secondary | ICD-10-CM | POA: Diagnosis not present

## 2021-12-08 HISTORY — DX: Thyroiditis, unspecified: E06.9

## 2021-12-08 LAB — TSH: TSH: 10.67 u[IU]/mL — ABNORMAL HIGH (ref 0.35–5.50)

## 2021-12-08 LAB — T3, FREE: T3, Free: 3.5 pg/mL (ref 2.3–4.2)

## 2021-12-08 LAB — T4, FREE: Free T4: 0.57 ng/dL — ABNORMAL LOW (ref 0.60–1.60)

## 2021-12-08 MED ORDER — LEVOTHYROXINE SODIUM 25 MCG PO TABS: 25.0000 ug | ORAL_TABLET | Freq: Every day | ORAL | 3 refills | Status: DC

## 2021-12-08 NOTE — Progress Notes (Signed)
? ? ?Name: Cindy Christian  ?MRN/ DOB: [id number], [birth year]    ?Age/ Sex: 51 y.o., female   ? ?PCP: Flossie Buffy, NP   ?Reason for Endocrinology Evaluation: Hyperthyrodism  ?   ?Date of Initial Endocrinology Evaluation: 12/08/2021   ? ? ?HPI: ?Cindy Christian is a 51 y.o. female with a past medical history of A. Fib. The patient presented for initial endocrinology clinic visit on 12/08/2021 for consultative assistance with her Hyperthyroidism.  ? ?Pt is here for further evaluation of hyperthyroidism  ? ? ?She was noted with an elevated TSH in 2021 with a max level of 5.209 uIU/mL. But in 2023 she has been noted with suppressed TSH < 0.01 uIU/mL with a low total T4.  ? ? ?Of note, the pt has a Hx of A. Fib S/P cardio ablation  ? ?She has been noted with weight fluctuations ? ? ?She was on Amiodarone for 6 months in 2021 ?No prior exposure to radiation  ? ?No biotin intake  ?She started adding iodide salt to her diet ~6 weeks ago  which caused initial palpitations , she started this after the fact her TSH came back low.  ?Denies local neck swelling  ?Denies loose stools or diarrhea  ?Denies palpitations  ?Denies palpitations  ?No viral  ? ?No FH of thyroid disease  ? ? ? ? ?HISTORY:  ?Past Medical History:  ?Past Medical History:  ?Diagnosis Date  ? Allergy   ? seasonal allergies  ? Atrial fibrillation with RVR ([place]) 03/25/2020  ? had ablation-NSR   ? Borderline glaucoma   ? popa  ? ?Past Surgical History:  ?Past Surgical History:  ?Procedure Laterality Date  ? ABLATION  03/25/2020  ? ATRIAL FIBRILLATION ABLATION N/A 03/25/2020  ? Procedure: ATRIAL FIBRILLATION ABLATION;  Surgeon: Constance Haw, MD;  Location: [place] [facility];  Service: Cardiovascular;  Laterality: N/A;  ? BUBBLE STUDY  12/30/2019  ? Procedure: BUBBLE STUDY;  Surgeon: Elouise Munroe, MD;  Location: [place];  Service: Cardiovascular;;  ? CARDIOVERSION N/A 12/30/2019  ? Procedure: CARDIOVERSION;  Surgeon: Elouise Munroe, MD;   Location: [place];  Service: Cardiovascular;  Laterality: N/A;  ? [place]  ? middle finger  ? KNEE ARTHROPLASTY Bilateral 2000 R 2008 L  ? LEEP  2004  ? 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 TEE WITHOUT CARDIOVERSION N/A 12/30/2019  ? Procedure: TRANSESOPHAGEAL ECHOCARDIOGRAM (TEE);  Surgeon: Elouise Munroe, MD;  Location: [facility];  Service: Cardiovascular;  Laterality: N/A;  ? WISDOM TOOTH EXTRACTION    ?  ?Social History:  reports that she quit smoking about 23 years ago. Her smoking use included cigarettes. She has a 3.25 pack-year smoking history. She has never used smokeless tobacco. She reports that she does not currently use alcohol. She reports that she does not use drugs. ?Family History: family history includes Colon polyps (age of onset: 65) in her mother; Diabetes in her maternal uncle; Stroke in her maternal grandmother. ? ? ?HOME MEDICATIONS: ?Allergies as of 12/08/2021   ? ?   Reactions  ? Penicillins Other (See Comments)  ? Childhood allergy ?Did it involve swelling of the face/tongue/throat, SOB, or low BP? Unknown ?Did it involve sudden or severe rash/hives, skin peeling, or any reaction on the inside of your mouth or nose? Unknown ?Did you need to seek medical attention at a hospital or doctor's office? Unknown ?When did it last happen?     young child  ?If all above  answers are ?NO?, may proceed with cephalosporin use.  ? ?  ? ?  ?Medication List  ?  ? ?  ? Accurate as of December 08, 2021  8:42 AM. If you have any questions, ask your nurse or doctor.  ?  ?  ? ?  ? ?OVER THE COUNTER MEDICATION ?Place 2 drops into both eyes daily. Simalasin Allergy Eye Relief - homeopathic ?  ? ?  ?  ? ? ?REVIEW OF SYSTEMS: ?A comprehensive ROS was conducted with the patient and is negative except as per HPI and below:  ?ROS  ? ? ? ?OBJECTIVE:  ?VS: BP 130/72 (BP Location: Left Arm, Patient Position: Sitting, Cuff Size: Small)   Pulse 80   Ht 5' 3.75" (1.619 m)   Wt 166 lb (75.3 kg)   SpO2 98%   BMI  28.72 kg/m?   ? ?Wt Readings from Last 3 Encounters:  ?12/08/21 166 lb (75.3 kg)  ?10/06/21 157 lb (71.2 kg)  ?04/20/21 163 lb (73.9 kg)  ? ? ? ?EXAM: ?General: Pt appears well and is in NAD  ?Eyes: External eye exam normal without stare, lid lag or exophthalmos.  EOM intact.   ?Neck: General: Supple without adenopathy. ?Thyroid: Thyroid size normal.  No goiter or nodules appreciated. No thyroid bruit.  ?Lungs: Clear with good BS bilat with no rales, rhonchi, or wheezes  ?Heart: Auscultation: RRR.  ?Abdomen: Normoactive bowel sounds, soft, nontender, without masses or organomegaly palpable  ?Extremities:  ?BL LE: No pretibial edema normal ROM and strength.  ?Mental Status: Judgment, insight: Intact ?Orientation: Oriented to time, place, and person ?Memory: Intact for recent and remote events ?Mood and affect: No depression, anxiety, or agitation  ? ? ? ?DATA REVIEWED: ? ?  ? Latest Reference Range & Units 10/20/21 08:44 12/08/21 09:24  ?TSH 0.35 - 5.50 uIU/mL 0.31 (L) 10.67 (H)  ?Triiodothyronine,Free,Serum 2.3 - 4.2 pg/mL  3.5  ?T4,Free(Direct) 0.60 - 1.60 ng/dL  0.57 (L)  ? ? ?ASSESSMENT/PLAN/RECOMMENDATIONS:  ? ?Hyperthyroidism  ? ?We initially discussed differential diagnosis of Graves' disease, thyroiditis, autonomous thyroid nodule ? ?-We also discussed her high risk for having recurrence of atrial fibrillation as well as increased bone resorption ?-Patient is skeptical about taking any medications ?We discussed that Graves' Disease is a result of an autoimmune condition involving the thyroid.  ? ? ?-We discussed with pt the benefits of methimazole in the Tx of hyperthyroidism, as well as the possible side effects/complications of anti-thyroid drug Tx (specifically detailing the rare, but serious side effect of agranulocytosis). She was informed of need for regular thyroid function monitoring while on methimazole to ensure appropriate dosage without over-treatment. As well, we discussed the possible side  effects of methimazole including the chance of rash, the small chance of liver irritation/juandice and the <=1 in 300-400 chance of sudden onset agranulocytosis.  We discussed importance of going to ED promptly (and stopping methimazole) if shewere to develop significant fever with severe sore throat of other evidence of acute infection.    ?-Thyroid ultrasound has been ordered ?-TRAb pending ?-Repeat TFTs show hypothyroid rather than hyperthyroid ?-This clinical scenario is most consistent with some form of thyroiditis ?-She has not been on amiodarone for years ? ?Medications : ?Start levothyroxine 25 mcg daily ? ? ? ?Attempted to contact the patient on 12/08/2021 at 12:45 PM, no answer a portal message was sent ? ? ?Follow-up in 3 months ?Labs in 6 weeks ? ? ?Signed electronically by: ?Abby Nena Jordan, MD ? ?[place]  [facility]  ?[facility] ?[address] ?[place], [place] [address] ?Phone: [phone] ?FAX: [phone] ? ? ?CC: ?Nche, Charlene Brooke, NP ?[place] [address] ?Phone: [phone] ?Fax: [phone] ? ? ?Return to Endocrinology clinic as below: ?Future Appointments  ?Date Time Provider [place]  ?01/01/2022 10:20 AM O'Neal, Cassie Freer, MD [facility]  ?  ? ? ? ? ? ?

## 2021-12-11 LAB — TRAB (TSH RECEPTOR BINDING ANTIBODY): TRAB: <1 IU/L (ref <=2.00)

## 2021-12-15 ENCOUNTER — Encounter: Payer: Self-pay | Admitting: Internal Medicine

## 2021-12-19 ENCOUNTER — Encounter: Payer: Self-pay | Admitting: Internal Medicine

## 2021-12-20 ENCOUNTER — Other Ambulatory Visit: Payer: Self-pay | Admitting: Internal Medicine

## 2021-12-20 ENCOUNTER — Encounter (HOSPITAL_BASED_OUTPATIENT_CLINIC_OR_DEPARTMENT_OTHER): Payer: Self-pay

## 2021-12-20 ENCOUNTER — Ambulatory Visit (HOSPITAL_BASED_OUTPATIENT_CLINIC_OR_DEPARTMENT_OTHER): Payer: 59

## 2021-12-20 DIAGNOSIS — E069 Thyroiditis, unspecified: Secondary | ICD-10-CM

## 2021-12-25 ENCOUNTER — Ambulatory Visit
Admission: RE | Admit: 2021-12-25 | Discharge: 2021-12-25 | Disposition: A | Discharge status: Home / Self Care | Payer: No Typology Code available for payment source | Source: Ambulatory Visit | Attending: Internal Medicine | Admitting: Internal Medicine

## 2021-12-25 ENCOUNTER — Encounter: Payer: Self-pay | Admitting: Cardiovascular Disease

## 2021-12-25 ENCOUNTER — Other Ambulatory Visit: Payer: Self-pay

## 2021-12-25 DIAGNOSIS — E0590 Thyrotoxicosis, unspecified without thyrotoxic crisis or storm: Secondary | ICD-10-CM

## 2021-12-26 ENCOUNTER — Telehealth: Payer: Self-pay | Admitting: Internal Medicine

## 2021-12-26 DIAGNOSIS — E0590 Thyrotoxicosis, unspecified without thyrotoxic crisis or storm: Secondary | ICD-10-CM

## 2021-12-26 DIAGNOSIS — E069 Thyroiditis, unspecified: Secondary | ICD-10-CM

## 2021-12-26 DIAGNOSIS — E041 Nontoxic single thyroid nodule: Secondary | ICD-10-CM

## 2021-12-26 NOTE — Telephone Encounter (Signed)
Discussed thyroid ultrasound results with the patient on 12/26/2021 at 12:45 PM ? ? ? ?I have recommended proceeding with right inferior 1.7 cm nodule ? ? ?Patient in agreement ? ?She is taking levothyroxine as prescribed ?There was a question about a parathyroid adenoma, calcium is normal ? ? ? ?Mack Guise, MD ? ?[facility]  ?[facility] ?[address] ?[place], [place] [address] ?Phone: [phone] ?FAX: [phone] ? ?

## 2022-01-01 ENCOUNTER — Ambulatory Visit: Payer: Self-pay | Admitting: Cardiovascular Disease

## 2022-01-12 ENCOUNTER — Ambulatory Visit (HOSPITAL_COMMUNITY)
Admission: RE | Admit: 2022-01-12 | Discharge: 2022-01-12 | Disposition: A | Discharge status: Home / Self Care | Payer: 59 | Source: Ambulatory Visit | Attending: Internal Medicine | Admitting: Internal Medicine

## 2022-01-12 DIAGNOSIS — E041 Nontoxic single thyroid nodule: Secondary | ICD-10-CM | POA: Diagnosis present

## 2022-01-12 DIAGNOSIS — D440 Neoplasm of uncertain behavior of thyroid gland: Secondary | ICD-10-CM | POA: Insufficient documentation

## 2022-01-12 MED ORDER — LIDOCAINE HCL (PF) 1 % IJ SOLN
INTRAMUSCULAR | Status: AC
  Filled 2022-01-12: qty 30

## 2022-01-15 LAB — CYTOLOGY - NON PAP

## 2022-01-16 ENCOUNTER — Encounter: Payer: Self-pay | Admitting: Internal Medicine

## 2022-01-19 ENCOUNTER — Other Ambulatory Visit: Payer: 59

## 2022-01-19 ENCOUNTER — Other Ambulatory Visit (INDEPENDENT_AMBULATORY_CARE_PROVIDER_SITE_OTHER): Payer: 59

## 2022-01-19 DIAGNOSIS — E069 Thyroiditis, unspecified: Secondary | ICD-10-CM

## 2022-01-19 LAB — TSH: TSH: 6.34 u[IU]/mL — ABNORMAL HIGH (ref 0.35–5.50)

## 2022-01-19 LAB — T4, FREE: Free T4: 0.76 ng/dL (ref 0.60–1.60)

## 2022-03-02 ENCOUNTER — Ambulatory Visit: Payer: Self-pay | Admitting: Cardiovascular Disease

## 2022-03-06 NOTE — Progress Notes (Signed)
Name: Cindy Christian  MRN/ DOB: [id number], 1970/10/22    Age/ Sex: 51 y.o., female    PCP: Flossie Buffy, NP   Reason for Endocrinology Evaluation: Hyperthyrodism     Date of Initial Endocrinology Evaluation: 12/08/2021    HPI: Ms. Cindy Christian is a 51 y.o. female with a past medical history of A. Fib. The patient presented for initial endocrinology clinic visit on 12/08/2021 for consultative assistance with her Hyperthyroidism.   Pt is here for further evaluation of hyperthyroidism    She was noted with an elevated TSH in 2021 with a max level of 5.209 uIU/mL. But in 2023 she has been noted with suppressed TSH < 0.01 uIU/mL with a low total T4.    Of note, the pt has a Hx of A. Fib S/P cardio ablation   She was on Amiodarone for 6 months in 2021 No prior exposure to radiation   Repeat TFT's at our clinic showed hypothyroidism which is consistent with subacute thyroiditis and was started on Levothyroxine 11/2021 TRAb undetectable  Thyroid ultrasound  (12/25/2021) revealed a solitary thyroid nodule 1.7 cm meeting FNA criteria. She is S/P benign FNA 01/12/2022    No FH of thyroid disease    SUBJECTIVE:     Today (03/08/22): [name] is here for a follow up on Thyroid disease. She is concerned about her thyroid nodule despite a benign cytology report, she is requesting a radiofrequency ablation  as she is concerned about the nodule  She is perimenopausal  with hot flashes  He has mild dysphagia Has noted weight gain  Has occasional palpitations   No Fh of breast/ovarian cancer  No FH of DVT's  No migraine headaches    Levothyroxine 25 mcg daily   HISTORY:  Past Medical History:  Past Medical History:  Diagnosis Date   Allergy    seasonal allergies   Atrial fibrillation with RVR ([place]) 03/25/2020   had ablation-NSR    Borderline glaucoma    popa   Thyroiditis 12/08/2021   Past Surgical History:  Past Surgical History:  Procedure Laterality Date   ABLATION   03/25/2020   ATRIAL FIBRILLATION ABLATION N/A 03/25/2020   Procedure: ATRIAL FIBRILLATION ABLATION;  Surgeon: Constance Haw, MD;  Location: [place] [facility];  Service: Cardiovascular;  Laterality: N/A;   BUBBLE STUDY  12/30/2019   Procedure: BUBBLE STUDY;  Surgeon: Elouise Munroe, MD;  Location: [place];  Service: Cardiovascular;;   CARDIOVERSION N/A 12/30/2019   Procedure: CARDIOVERSION;  Surgeon: Elouise Munroe, MD;  Location: [facility];  Service: Cardiovascular;  Laterality: N/A;   FINGER FRACTURE SURGERY Left 1983   middle finger   KNEE ARTHROPLASTY Bilateral 2000 R 2008 L   LEEP  2004   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 Her smoking use included cigarettes. She has a 3.25 pack-year smoking history. She has never used smokeless tobacco. She reports that she does not currently use alcohol. She reports that she does not use drugs. Family History: family history includes Colon polyps (age of onset: 33) in her mother; Diabetes in her maternal uncle; Stroke in her maternal grandmother.   HOME MEDICATIONS: Allergies as of 03/08/2022  Reactions   Penicillins Other (See Comments)   Childhood allergy Did it involve swelling of the face/tongue/throat, SOB, or low BP? Unknown Did it involve sudden or severe rash/hives, skin peeling, or any reaction on the inside of your mouth or nose? Unknown Did you need to seek medical attention at a hospital or doctor's office? Unknown When did it last happen?     young child  If all above answers are "NO", may proceed with cephalosporin use.        Medication List        Accurate as of March 08, 2022  8:25 AM. If you have any questions, ask your nurse or  doctor.          cetirizine 10 MG tablet Commonly known as: ZYRTEC Take 10 mg by mouth daily as needed for allergies.   levothyroxine 25 MCG tablet Commonly known as: SYNTHROID Take 1 tablet (25 mcg total) by mouth daily.   OVER THE COUNTER MEDICATION Place 1 drop into both eyes daily. Simalasin Allergy Eye Relief - homeopathic          OBJECTIVE:  VS: BP 110/66 (BP Location: Left Arm, Patient Position: Sitting, Cuff Size: Small)   Pulse 72   Ht 5' 3.75" (1.619 m)   Wt 174 lb 12.8 oz (79.3 kg)   SpO2 99%   BMI 30.24 kg/m    Wt Readings from Last 3 Encounters:  03/08/22 174 lb 12.8 oz (79.3 kg)  12/08/21 166 lb (75.3 kg)  10/06/21 157 lb (71.2 kg)     EXAM: General: Pt appears well and is in NAD  Eyes: External eye exam normal without stare, lid lag or exophthalmos.  EOM intact.   Neck: General: Supple without adenopathy. Thyroid: Thyroid size normal.  No goiter or nodules appreciated.   Lungs: Clear with good BS bilat with no rales, rhonchi, or wheezes  Heart: Auscultation: RRR.  Abdomen: Normoactive bowel sounds, soft, nontender, without masses or organomegaly palpable  Extremities:  BL LE: No pretibial edema normal ROM and strength.  Mental Status: Judgment, insight: Intact Orientation: Oriented to time, place, and person Mood and affect: No depression, anxiety, or agitation     DATA REVIEWED:  Latest Reference Range & Units 03/08/22 09:04  Sodium 135 - 145 mEq/L 141  Potassium 3.5 - 5.1 mEq/L 4.5  Chloride 96 - 112 mEq/L 103  CO2 19 - 32 mEq/L 30  Glucose 70 - 99 mg/dL 84  BUN 6 - 23 mg/dL 13  Creatinine 0.40 - 1.20 mg/dL 0.67  Calcium 8.4 - 10.5 mg/dL 10.3  Albumin 3.5 - 5.2 g/dL 4.7  GFR >60.00 mL/min 100.57    Latest Reference Range & Units 03/08/22 09:04  VITD 30.00 - 100.00 ng/mL 40.46    Latest Reference Range & Units 03/08/22 09:04  LH mIU/mL 63.83  FSH mIU/ML 96.6  Prolactin ng/mL 3.9  Glucose 70 - 99 mg/dL 84  Estradiol pg/mL 20   TSH 0.35 - 5.50 uIU/mL 4.09  Triiodothyronine (T3) 76 - 181 ng/dL 113  T4,Free(Direct) 0.60 - 1.60 ng/dL 0.89   Thyroid Ultrasound 12/25/2021  FINDINGS: Parenchymal Echotexture: Normal - no definitive evidence of glandular hyperemia.   Isthmus: Normal in size measures 0.3 cm in diameter   Right lobe: Slightly atrophic measuring 3.8 x 1.2 x 1.2 cm   Left lobe: Slightly atrophic measuring 3.8 x 1.4 x 1.2 cm   _________________________________________________________   Estimated total number of nodules >/= 1 cm: 1   Number of spongiform  nodules >/=  2 cm not described below (TR1): 0   Number of mixed cystic and solid nodules >/= 1.5 cm not described below ([place]): 0   _________________________________________________________   Nodule # 1:   Location: Right; Inferior (it is unclear whether this nodule arises exophytically from the inferior pole the right lobe of the thyroid or represents an adjacent nodule such as a parathyroid adenoma)   Maximum size: 1.7 cm; Other 2 dimensions: 0.8 x 0.8 cm   Composition: solid/almost completely solid (2)   Echogenicity: hypoechoic (2)   Shape: not taller-than-wide (0)   Margins: extra-thyroidal extension (3)   Echogenic foci: none (0)   ACR TI-RADS total points: 7.   ACR TI-RADS risk category: TR5 (>/= 7 points).   ACR TI-RADS recommendations:   **Given size (>/= 1.0 cm) and appearance, fine needle aspiration of this highly suspicious nodule should be considered based on TI-RADS criteria.   _________________________________________________________   IMPRESSION: 1. Solitary 1.7 cm right-sided nodule either arises exophytically from the inferior pole of the right lobe of the thyroid or (favored) represents an adjacent nodule such as a parathyroid adenoma or cervical lymph node. Clinical correlation is advised. Further evaluation with nuclear medicine parathyroid scintigraphy could be performed as indicated. Otherwise, this  nodule could undergo ultrasound-guided fine-needle aspiration as indicated. 2. Otherwise, slightly atrophic but otherwise normal-appearing thyroid.  FNA right nodule 01/12/2022  Clinical History: Thyroid right inferior lobe nodule  Specimen Submitted:  A. THYROID, RIGHT INFERIOR, FINE NEEDLE ASPIRATION:    FINAL MICROSCOPIC DIAGNOSIS:  - Consistent with benign follicular nodule ([place]da category II)   ASSESSMENT/PLAN/RECOMMENDATIONS:   Hypothyroidism:  -This followed a subacute thyroiditis phase -TSH at the upper limit of normal, we will continue with levothyroxine at this time  Medications : Continue levothyroxine 25 mcg daily   2. Right Thyroid Nodule :  -She is s/p FNA of the right 1.7 cm thyroid nodule with benign cytology -Reassurance has been provided but the patient is interested in radiofrequency ablation -There has been a question about this nodule on ultrasound whether it is a parathyroid adenoma -Her calcium level has been normal as well as vitamin D - PTH inappropriately normal    3.  Postmenopausal :   -FSH and LH are elevated consistent with post menopause - Pt with hot flashes, she typically does not wish to take medications unless necessary   Follow-up in 4 months   Signed electronically by: Mack Guise, MD  [facility]  [facility] [address] [place], [place] [address] Phone: [phone] FAX: [phone]   CC: Flossie Buffy, [place] [address] Phone: [phone] Fax: [phone]   Return to Endocrinology clinic as below: Future Appointments  Date Time Provider [place]  03/08/2022  8:50 AM Alma Muegge, Melanie Crazier, MD [facility] None  03/29/2022  8:00 AM O'Neal, Cassie Freer, MD [facility]

## 2022-03-08 ENCOUNTER — Ambulatory Visit: Payer: 59 | Admitting: Internal Medicine

## 2022-03-08 ENCOUNTER — Encounter: Payer: Self-pay | Admitting: Internal Medicine

## 2022-03-08 VITALS — BP 110/66 | HR 72 | Ht 63.75 in | Wt 174.8 lb

## 2022-03-08 DIAGNOSIS — E069 Thyroiditis, unspecified: Secondary | ICD-10-CM | POA: Diagnosis not present

## 2022-03-08 DIAGNOSIS — N911 Secondary amenorrhea: Secondary | ICD-10-CM

## 2022-03-08 DIAGNOSIS — E041 Nontoxic single thyroid nodule: Secondary | ICD-10-CM

## 2022-03-08 LAB — BASIC METABOLIC PANEL
BUN: 13 mg/dL (ref 6–23)
CO2: 30 mEq/L (ref 19–32)
Calcium: 10.3 mg/dL (ref 8.4–10.5)
Chloride: 103 mEq/L (ref 96–112)
Creatinine, Ser: 0.67 mg/dL (ref 0.40–1.20)
GFR: 100.57 mL/min (ref >60.00)
Glucose, Bld: 84 mg/dL (ref 70–99)
Potassium: 4.5 mEq/L (ref 3.5–5.1)
Sodium: 141 mEq/L (ref 135–145)

## 2022-03-08 LAB — LUTEINIZING HORMONE: LH: 63.83 m[IU]/mL

## 2022-03-08 LAB — ALBUMIN: Albumin: 4.7 g/dL (ref 3.5–5.2)

## 2022-03-08 LAB — T4, FREE: Free T4: 0.89 ng/dL (ref 0.60–1.60)

## 2022-03-08 LAB — TSH: TSH: 4.09 u[IU]/mL (ref 0.35–5.50)

## 2022-03-08 LAB — FOLLICLE STIMULATING HORMONE: FSH: 96.6 m[IU]/mL

## 2022-03-08 LAB — VITAMIN D 25 HYDROXY (VIT D DEFICIENCY, FRACTURES): VITD: 40.46 ng/mL (ref 30.00–100.00)

## 2022-03-08 MED ORDER — LEVOTHYROXINE SODIUM 25 MCG PO TABS: 25.0000 ug | ORAL_TABLET | Freq: Every day | ORAL | 1 refills | Status: DC

## 2022-03-09 LAB — PROLACTIN: Prolactin: 3.9 ng/mL

## 2022-03-09 LAB — ESTRADIOL: Estradiol: 20 pg/mL

## 2022-03-09 LAB — CALCIUM, IONIZED: Calcium, Ion: 5.3 mg/dL (ref 4.7–5.5)

## 2022-03-09 LAB — PARATHYROID HORMONE, INTACT (NO CA): PTH: 42 pg/mL (ref 16–77)

## 2022-03-09 LAB — T3: T3, Total: 113 ng/dL (ref 76–181)

## 2022-03-28 NOTE — Progress Notes (Deleted)
Cardiology Office Note:   Date:  03/28/2022  NAME:  Cindy Christian    MRN: [id number] DOB:  09-17-1971   PCP:  Flossie Buffy, NP  Cardiologist:  Evalina Field, MD  Electrophysiologist:  Constance Haw, MD   Referring MD: Flossie Buffy, NP   No chief complaint on file. ***  History of Present Illness:   Cindy Christian is a 51 y.o. female with a hx of persistent Afib, elevated calcium score, obesity who presents for follow-up.   Problem List 1. Obesity BMI 32 2. Persistent Afib -failed TEE/DCCV and antiarrhythmics  -Afib ablation 03/25/2020 3. CAD -CAC score 30 (94th percentile) -minimal prox LAD (<25%).  4. HLD  -T chol 140, HDL 51, LDL 75, TG 68  Past Medical History: Past Medical History:  Diagnosis Date   Allergy    seasonal allergies   Atrial fibrillation with RVR ([place]) 03/25/2020   had ablation-NSR    Borderline glaucoma    popa   Thyroiditis 12/08/2021    Past Surgical History: Past Surgical History:  Procedure Laterality Date   ABLATION  03/25/2020   ATRIAL FIBRILLATION ABLATION N/A 03/25/2020   Procedure: ATRIAL FIBRILLATION ABLATION;  Surgeon: Constance Haw, MD;  Location: [place] [facility];  Service: Cardiovascular;  Laterality: N/A;   BUBBLE STUDY  12/30/2019   Procedure: BUBBLE STUDY;  Surgeon: Elouise Munroe, MD;  Location: [place];  Service: Cardiovascular;;   CARDIOVERSION N/A 12/30/2019   Procedure: CARDIOVERSION;  Surgeon: Elouise Munroe, MD;  Location: [facility];  Service: Cardiovascular;  Laterality: N/A;   FINGER FRACTURE SURGERY Left 1983   middle finger   KNEE ARTHROPLASTY Bilateral 2000 R 2008 L   LEEP  2004   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     Allergies:    Penicillins   Social History: Social History   Socioeconomic History   Marital status: Single    Spouse name: Not on file   Number of children: Not on file   Years of education: Not on file   Highest education level: Not on file  Occupational History   Occupation: admin    Comment: Psychologist, counselling  Tobacco Use   Smoking status: Former    Packs/day: 0.25    Years: 13.00    Total pack years: 3.25    Types: Cigarettes    Quit date: 04/24/1998    Years since quitting: 23.9   Smokeless tobacco: Never  Vaping Use   Vaping Use: Never used  Substance and Sexual Activity   Alcohol use: Not Currently   Drug use: No   Sexual activity: Yes    Partners: Male    Birth control/protection: None  Other Topics Concern   Not on file  Social History Narrative   Walking daily   Social Determinants of Health   Financial Resource Strain: Not on file  Food Insecurity: Not on file  Transportation Needs: Not on file  Physical Activity: Not on file  Stress: Not on file  Social Connections: Not on file     Family History: The patient's ***family history includes Colon polyps (age of onset: 50) in her mother; Diabetes in  her maternal uncle; Stroke in her maternal grandmother. There is no history of Colon cancer, Esophageal cancer, Stomach cancer, or Rectal cancer.  ROS:   All other ROS reviewed and negative. Pertinent positives noted in the HPI.     EKGs/Labs/Other Studies Reviewed:   The following studies were personally reviewed by me today:  EKG:  EKG is *** ordered today.  The ekg ordered today demonstrates ***, and was personally reviewed by me.   Recent Labs: 10/06/2021: ALT 21; Hemoglobin 12.5; Platelets 263.0 03/08/2022: BUN 13; Creatinine, Ser 0.67; Potassium 4.5; Sodium 141; TSH 4.09   Recent Lipid Panel    Component Value Date/Time   CHOL 140 10/06/2021 1016    TRIG 68.0 10/06/2021 1016   HDL 51.00 10/06/2021 1016   CHOLHDL 3 10/06/2021 1016   VLDL 13.6 10/06/2021 1016   LDLCALC 75 10/06/2021 1016    Physical Exam:   VS:  There were no vitals taken for this visit.   Wt Readings from Last 3 Encounters:  03/08/22 174 lb 12.8 oz (79.3 kg)  12/08/21 166 lb (75.3 kg)  10/06/21 157 lb (71.2 kg)    General: Well nourished, well developed, in no acute distress Head: Atraumatic, normal size  Eyes: PEERLA, EOMI  Neck: Supple, no JVD Endocrine: No thryomegaly Cardiac: Normal S1, S2; RRR; no murmurs, rubs, or gallops Lungs: Clear to auscultation bilaterally, no wheezing, rhonchi or rales  Abd: Soft, nontender, no hepatomegaly  Ext: No edema, pulses 2+ Musculoskeletal: No deformities, BUE and BLE strength normal and equal Skin: Warm and dry, no rashes   Neuro: Alert and oriented to person, place, time, and situation, CNII-XII grossly intact, no focal deficits  Psych: Normal mood and affect   ASSESSMENT:   Cindy Christian is a 51 y.o. female who presents for the following: No diagnosis found.  PLAN:   There are no diagnoses linked to this encounter.  {Are you ordering a CV Procedure (e.g. stress test, cath, DCCV, TEE, etc)?   Press F2        :[id number]}  Disposition: No follow-ups on file.  Medication Adjustments/Labs and Tests Ordered: Current medicines are reviewed at length with the patient today.  Concerns regarding medicines are outlined above.  No orders of the defined types were placed in this encounter.  No orders of the defined types were placed in this encounter.   There are no Patient Instructions on file for this visit.   Time Spent with Patient: I have spent a total of *** minutes with patient reviewing hospital notes, telemetry, EKGs, labs and examining the patient as well as establishing an assessment and plan that was discussed with the patient.  > 50% of time was spent in direct patient care.  Signed, Addison Naegeli. Audie Box, MD,  [place]  [address], [place], [place] [address] [phone]  03/28/2022 6:22 AM

## 2022-03-29 ENCOUNTER — Ambulatory Visit: Payer: Self-pay | Admitting: Cardiovascular Disease

## 2022-04-09 ENCOUNTER — Encounter: Payer: Self-pay | Admitting: Internal Medicine

## 2022-04-24 ENCOUNTER — Other Ambulatory Visit: Payer: Self-pay | Admitting: Internal Medicine

## 2022-04-24 DIAGNOSIS — E041 Nontoxic single thyroid nodule: Secondary | ICD-10-CM

## 2022-05-01 ENCOUNTER — Ambulatory Visit
Admission: RE | Admit: 2022-05-01 | Discharge: 2022-05-01 | Disposition: A | Discharge status: Home / Self Care | Payer: Self-pay | Source: Ambulatory Visit | Attending: Internal Medicine | Admitting: Internal Medicine

## 2022-05-01 ENCOUNTER — Encounter: Payer: Self-pay | Admitting: *Deleted

## 2022-05-01 DIAGNOSIS — E041 Nontoxic single thyroid nodule: Secondary | ICD-10-CM

## 2022-05-01 HISTORY — PX: IR RADIOLOGIST EVAL & MGMT: IMG5224

## 2022-05-01 NOTE — Consult Note (Signed)
Chief Complaint: Patient was seen in virtual telephone consultation today for symptomatic thyroid nodule  Referring Physician(s): Shamleffer,Ibtehal Jaralla  History of Present Illness: Cindy Christian is a 51 y.o. female with history of thyroid disorder and benign right inferior thyroid nodule.  She reports a history of atrial fibrillation in 2021 which was thought to potentially be due to hyperthyroidism, however underwent successful nodal ablation/cardioversion.  Last December she reports having hyperthyroidism by laboratory analysis, with TSH totally suppressed.  By March, this increased to 10.7, and has reduced to normal levels with levothyroxine.  Given thyroid dysregulation, thyroid ultrasound was obtained which demonstrated a TR5 (arguably TR4) nodule with possible extrathyroid component posteriorly, questioning possibility of parathyroid adenoma.  This underwent FNA on 01/12/22 which showed Bethesda II benign thyroid follicular tissue.  Latest thyroid labs in June are all normal.  She endorses some mild dysphagia requiring her to chew her food more consciously as some food tends to get stuck in her throat or is difficult to swallow.  Additionally, she complains of always feeling like she has to clear her throat but feels like she never can.  She thinks she may have some mild voice change, but no hoarseness or comments from friends or family.  Each of these symptoms started within the past year.   Thyroid Symptom Score: 3  Thyroid Cosmetic Score:  No palpable mass (Grade 1).  Past Medical History:  Diagnosis Date   Allergy    seasonal allergies   Atrial fibrillation with RVR ([place]) 03/25/2020   had ablation-NSR    Borderline glaucoma    popa   Thyroiditis 12/08/2021    Past Surgical History:  Procedure Laterality Date   ABLATION  03/25/2020   ATRIAL FIBRILLATION ABLATION N/A 03/25/2020   Procedure: ATRIAL FIBRILLATION ABLATION;  Surgeon: Constance Haw, MD;  Location:  [facility];  Service: Cardiovascular;  Laterality: N/A;   BUBBLE STUDY  12/30/2019   Procedure: BUBBLE STUDY;  Surgeon: Elouise Munroe, MD;  Location: [facility];  Service: Cardiovascular;;   CARDIOVERSION N/A 12/30/2019   Procedure: CARDIOVERSION;  Surgeon: Elouise Munroe, MD;  Location: [facility] ENDOSCOPY;  Service: Cardiovascular;  Laterality: N/A;   FINGER FRACTURE SURGERY Left 1983   middle finger   KNEE ARTHROPLASTY Bilateral 2000 R 2008 L   LEEP  2004   CIN 1-2   TEE WITHOUT CARDIOVERSION N/A 12/30/2019   Procedure: TRANSESOPHAGEAL ECHOCARDIOGRAM (TEE);  Surgeon: Elouise Munroe, MD;  Location: [facility] ENDOSCOPY;  Service: Cardiovascular;  Laterality: N/A;   WISDOM TOOTH EXTRACTION      Allergies: Penicillins  Medications: Prior to Admission medications   Medication Sig Start Date End Date Taking? Authorizing Provider  cetirizine (ZYRTEC) 10 MG tablet Take 10 mg by mouth daily as needed for allergies. Patient not taking: Reported on 03/08/2022    [provider]  levothyroxine (SYNTHROID) 25 MCG tablet Take 1 tablet (25 mcg total) by mouth daily. 03/08/22   Shamleffer, Melanie Crazier, MD  OVER THE COUNTER MEDICATION Place 1 drop into both eyes daily. Simalasin Allergy Eye Relief - homeopathic    [provider]     Family History  Problem Relation Age of Onset   Colon polyps Mother 62   Diabetes Maternal Uncle        Type 1   Stroke Maternal Grandmother    Colon cancer Neg Hx    Esophageal cancer Neg Hx    Stomach cancer Neg Hx    Rectal cancer Neg Hx  Social History   Socioeconomic History   Marital status: Single    Spouse name: Not on file   Number of children: Not on file   Years of education: Not on file   Highest education level: Not on file  Occupational History   Occupation: admin    Comment: Psychologist, counselling  Tobacco Use   Smoking status: Former    Packs/day: 0.25    Years: 13.00    Total pack years: 3.25    Types:  Cigarettes    Quit date: 04/24/1998    Years since quitting: 24.0   Smokeless tobacco: Never  Vaping Use   Vaping Use: Never used  Substance and Sexual Activity   Alcohol use: Not Currently   Drug use: No   Sexual activity: Yes    Partners: Male    Birth control/protection: None  Other Topics Concern   Not on file  Social History Narrative   Walking daily   Social Determinants of Health   Financial Resource Strain: Not on file  Food Insecurity: Not on file  Transportation Needs: Not on file  Physical Activity: Not on file  Stress: Not on file  Social Connections: Not on file     Review of Systems: A 12 point ROS discussed and pertinent positives are indicated in the HPI above.  All other systems are negative.  Vital Signs: There were no vitals taken for this visit.  No physical examination was performed in lieu of virtual telephone clinic visit.  Imaging: US Thyroid 12/25/21   Nodule volume = 5 cc  Labs: CBC (10/06/21) WBC 4.6, Hb 12.5, Hct 38.4, Plt 263  Coags None recent.  TFTs (03/08/22) Serum TSH - 4.09  Serum free T4 - 0.89 Serum T3 - 113 Thyroperoxidase Antibody - 1 (10/20/21) Thyroglobulin Antibody - not available Calcitonin - not available   Prior Thyroid FNA: 01/22/22 FINAL MICROSCOPIC DIAGNOSIS:  Consistent with benign follicular nodule (Bethesda category II)      Assessment and Plan: 51 year old female with mildly symptomatic, relatively small, benign right inferior thyroid nodule in the setting of thyroid dysregulation of uncertain etiology, likely thyroiditis.  Currently hypothyroid requiring low dose thyroid supplementation.  Given relative lack of bulk symptoms at this time, watchful waiting is indicated.    We spoke at length about thyroid RFA and its indications, risks, and benefits in addition to briefly about the expected outcomes long term.  We spoke in detail about how her current nodule is almost certainly in no way a cause of her  thyroid dysregulation and that ablating the nodule will likely not change her requirement for levothyroxine currently.  We did discuss that her dysphagia and sensation of having to clear her throat may be related to the presence of the nodule.  If the nodule were to grow significantly with concomitant worsening of these symptoms, she may be a great candidate for thyroid RFA at that time.    Plan to follow up in 1 month with repeat (6 month interval) thyroid ultrasound to assess for change in current nodule.   Electronically Signed: Suzette Battiest, MD 05/01/2022, 2:25 PM   I spent a total of  60 minutes  in virtual telephone clinical consultation, greater than 50% of which was counseling/coordinating care for thyroid nodule.

## 2022-05-06 IMAGING — MG MM DIGITAL SCREENING BILAT W/ TOMO AND CAD
8 series · 9 of 24 positions shown · non-contrast
Comparison: Previous exam(s).

CLINICAL DATA: Screening.

EXAM:
DIGITAL SCREENING BILATERAL MAMMOGRAM WITH TOMOSYNTHESIS AND CAD
TECHNIQUE: Bilateral screening digital craniocaudal and mediolateral oblique
mammograms were obtained. Bilateral screening digital breast
tomosynthesis was performed. The images were evaluated with
computer-aided detection.

[R CC synth-2D]
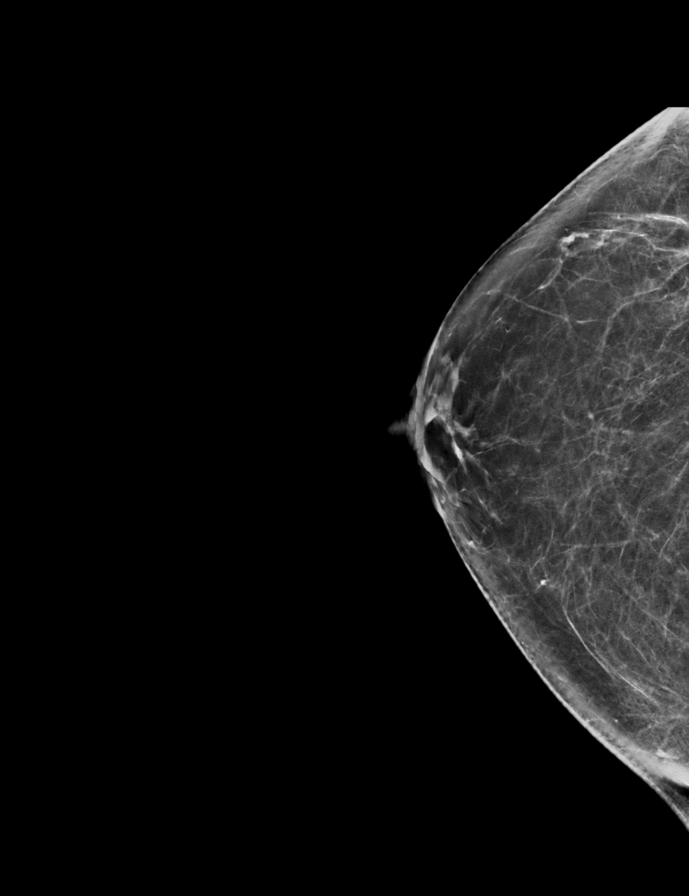

[L CC synth-2D]
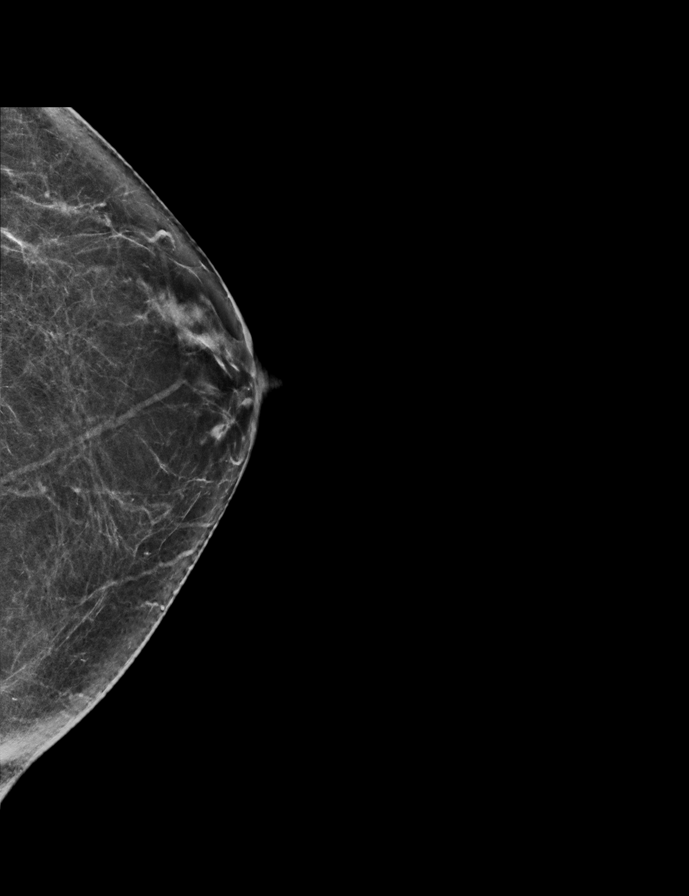

[L MLO synth-2D]
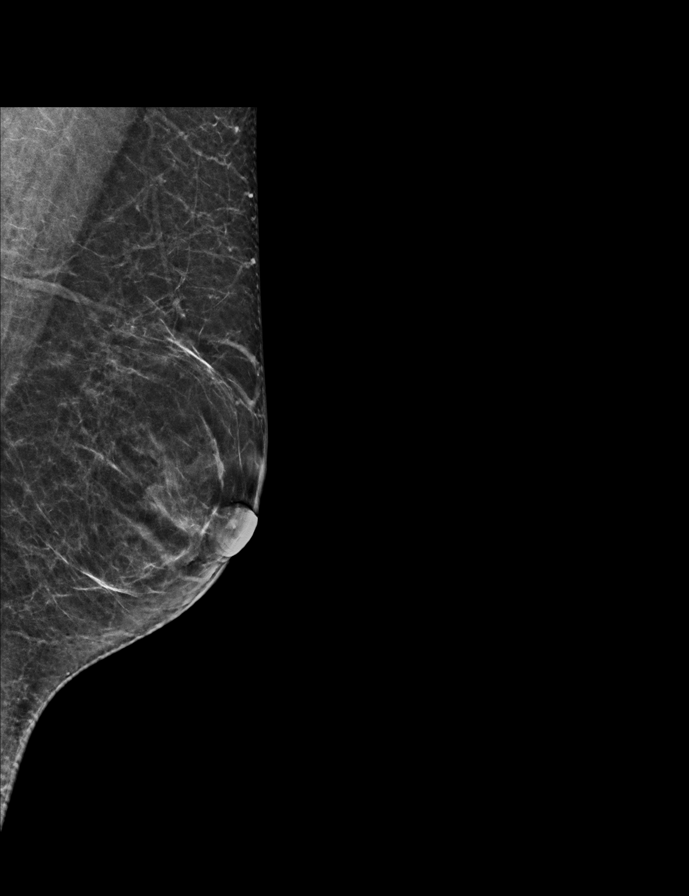

[R MLO synth-2D]
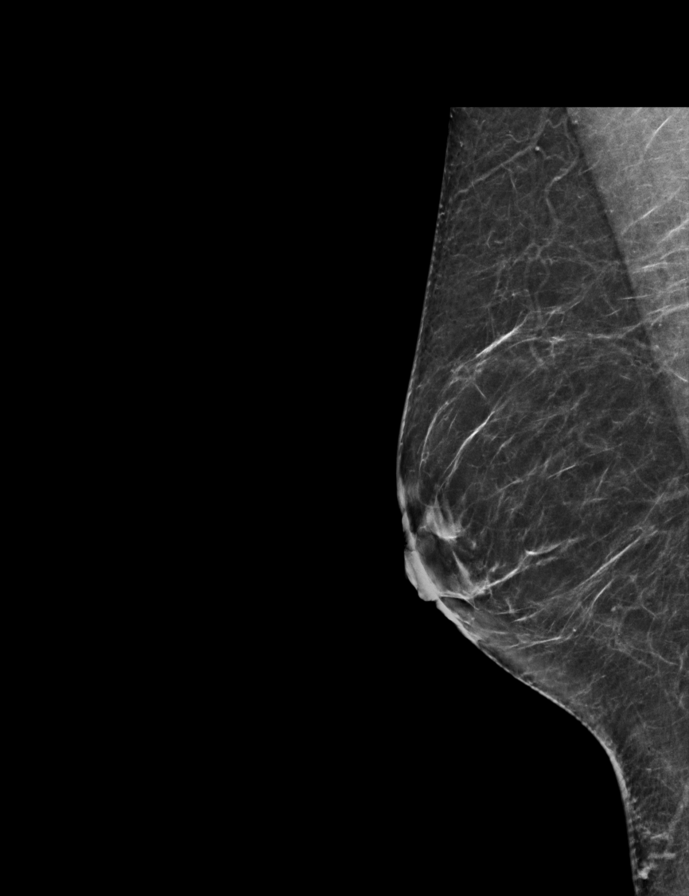

[L MLO tomo · 2 of 53 frames shown]
[frame 18/53]
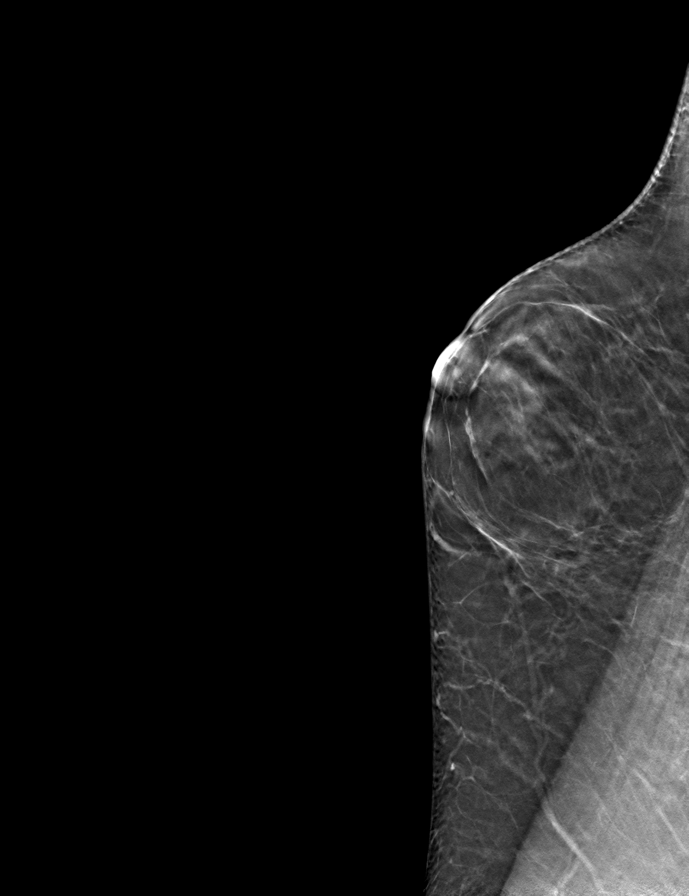
[frame 27/53]
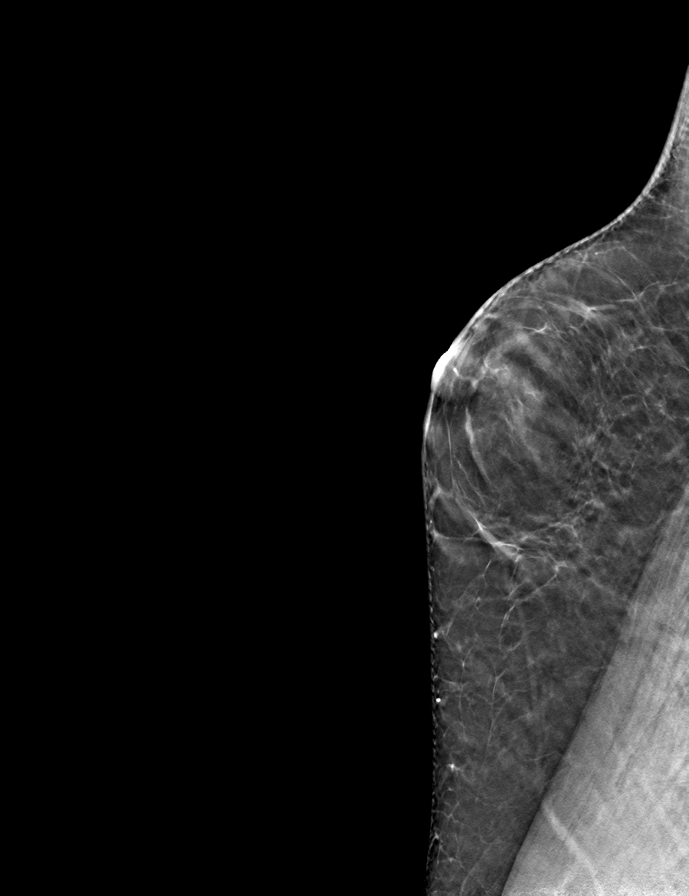

[L CC tomo · tomo slice 31/60.0]
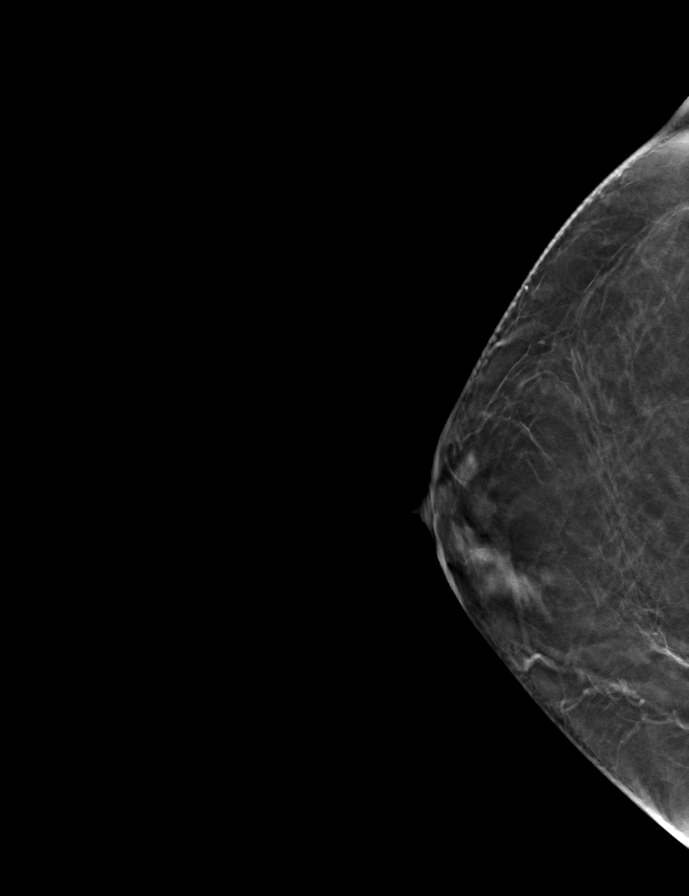

[R MLO tomo · tomo slice 29/58.0]
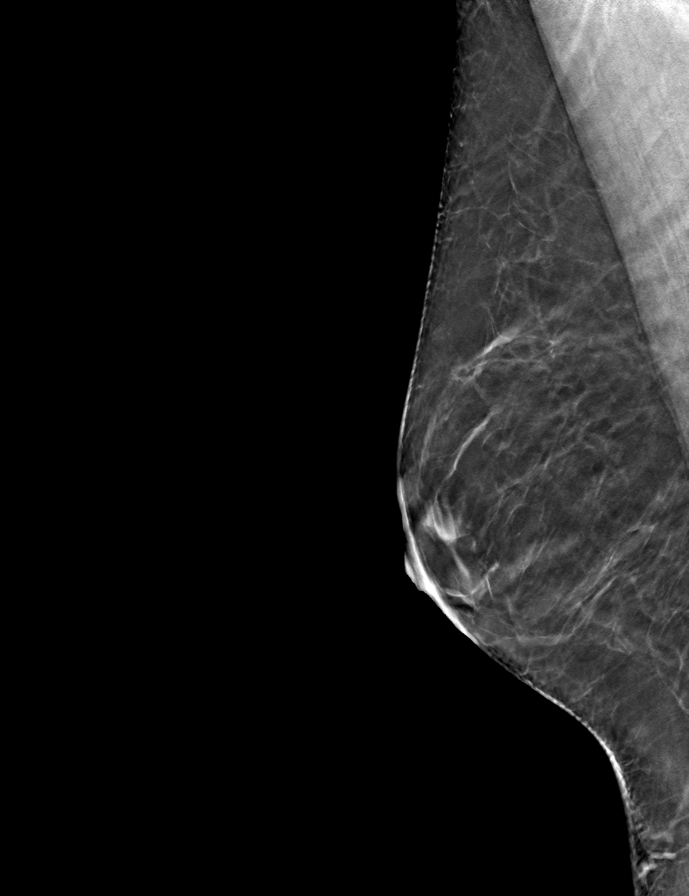

[R CC tomo · tomo slice 31/62.0]
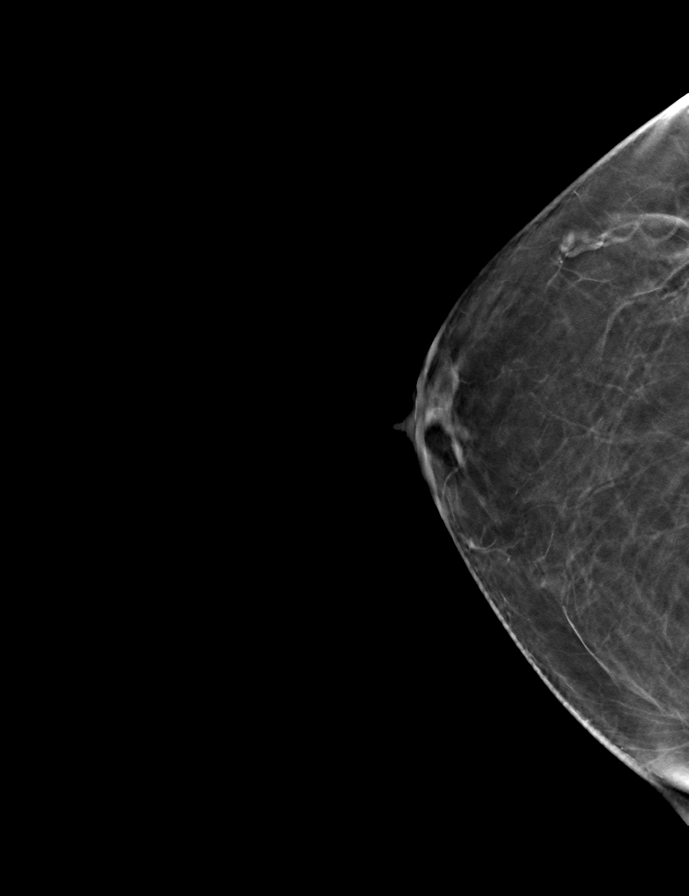

[9 of 24 positions shown; findings below may reference images not displayed]

ACR Breast Density Category b: There are scattered areas of
fibroglandular density.
FINDINGS: There are no findings suspicious for malignancy.
IMPRESSION: No mammographic evidence of malignancy. A result letter of this
screening mammogram will be mailed directly to the patient.

RECOMMENDATION:
Screening mammogram in one year. (Code:51-O-LD2)

BI-RADS CATEGORY  1: Negative.

## 2022-05-17 ENCOUNTER — Other Ambulatory Visit: Payer: Self-pay | Admitting: Interventional Radiology

## 2022-05-17 DIAGNOSIS — E041 Nontoxic single thyroid nodule: Secondary | ICD-10-CM

## 2022-06-27 ENCOUNTER — Ambulatory Visit
Admission: RE | Admit: 2022-06-27 | Discharge: 2022-06-27 | Disposition: A | Discharge status: Home / Self Care | Payer: Commercial Managed Care - HMO | Source: Ambulatory Visit | Attending: Interventional Radiology | Admitting: Interventional Radiology

## 2022-06-27 DIAGNOSIS — E041 Nontoxic single thyroid nodule: Secondary | ICD-10-CM

## 2022-06-29 ENCOUNTER — Encounter: Payer: Self-pay | Admitting: *Deleted

## 2022-06-29 ENCOUNTER — Ambulatory Visit
Admission: RE | Admit: 2022-06-29 | Discharge: 2022-06-29 | Disposition: A | Discharge status: Home / Self Care | Payer: Commercial Managed Care - HMO | Source: Ambulatory Visit | Attending: Interventional Radiology | Admitting: Interventional Radiology

## 2022-06-29 DIAGNOSIS — E041 Nontoxic single thyroid nodule: Secondary | ICD-10-CM

## 2022-06-29 HISTORY — PX: IR RADIOLOGIST EVAL & MGMT: IMG5224

## 2022-06-29 NOTE — Progress Notes (Signed)
Reason for follow up:  Patient was seen in virtual telephone consultation today for symptomatic thyroid nodule   Referring Physician(s): Shamleffer,Ibtehal Jaralla   History of Present Illness: Initial:  Cindy Christian is a 51 y.o. female with history of thyroid disorder and benign right inferior thyroid nodule.  She reports a history of atrial fibrillation in 2021 which was thought to potentially be due to hyperthyroidism, however underwent successful nodal ablation/cardioversion.  Last December she reports having hyperthyroidism by laboratory analysis, with TSH totally suppressed.  By March, this increased to 10.7, and has reduced to normal levels with levothyroxine.  Given thyroid dysregulation, thyroid ultrasound was obtained which demonstrated a TR5 (arguably TR4) nodule with possible extrathyroid component posteriorly, questioning possibility of parathyroid adenoma.  This underwent FNA on 01/12/22 which showed Bethesda II benign thyroid follicular tissue.  Latest thyroid labs in June are all normal.   She endorses some mild dysphagia requiring her to chew her food more consciously as some food tends to get stuck in her throat or is difficult to swallow.  Additionally, she complains of always feeling like she has to clear her throat but feels like she never can.  She thinks she may have some mild voice change, but no hoarseness or comments from friends or family.  Each of these symptoms started within the past year.   Thyroid Symptom Score: 3   Thyroid Cosmetic Score:  No palpable mass (Grade 1).  She presents today via telephone follow up after 6 month thyroid ultrasound to assess any change in the thyroid nodule, as it was not amenable to RFA due to small size at prior visit.  Thyroid ultrasound was completed on 06/27/22.  No change in symptoms.  Past Medical History:  Diagnosis Date   Allergy    seasonal allergies   Atrial fibrillation with RVR ([place]) 03/25/2020   had ablation-NSR     Borderline glaucoma    popa   Thyroiditis 12/08/2021    Past Surgical History:  Procedure Laterality Date   ABLATION  03/25/2020   ATRIAL FIBRILLATION ABLATION N/A 03/25/2020   Procedure: ATRIAL FIBRILLATION ABLATION;  Surgeon: Constance Haw, MD;  Location: [facility];  Service: Cardiovascular;  Laterality: N/A;   BUBBLE STUDY  12/30/2019   Procedure: BUBBLE STUDY;  Surgeon: Elouise Munroe, MD;  Location: [facility];  Service: Cardiovascular;;   CARDIOVERSION N/A 12/30/2019   Procedure: CARDIOVERSION;  Surgeon: Elouise Munroe, MD;  Location: [facility] ENDOSCOPY;  Service: Cardiovascular;  Laterality: N/A;   FINGER FRACTURE SURGERY Left 1983   middle finger   IR RADIOLOGIST EVAL & MGMT  05/01/2022   KNEE ARTHROPLASTY Bilateral 2000 R 2008 L   LEEP  2004   CIN 1-2   TEE WITHOUT CARDIOVERSION N/A 12/30/2019   Procedure: TRANSESOPHAGEAL ECHOCARDIOGRAM (TEE);  Surgeon: Elouise Munroe, MD;  Location: [facility] ENDOSCOPY;  Service: Cardiovascular;  Laterality: N/A;   WISDOM TOOTH EXTRACTION      Allergies: Penicillins  Medications: Prior to Admission medications   Medication Sig Start Date End Date Taking? Authorizing Provider  cetirizine (ZYRTEC) 10 MG tablet Take 10 mg by mouth daily as needed for allergies. Patient not taking: Reported on 03/08/2022    [provider]  levothyroxine (SYNTHROID) 25 MCG tablet Take 1 tablet (25 mcg total) by mouth daily. 03/08/22   Shamleffer, Melanie Crazier, MD  OVER THE COUNTER MEDICATION Place 1 drop into both eyes daily. Simalasin Allergy Eye Relief - homeopathic    [provider]  Family History  Problem Relation Age of Onset   Colon polyps Mother 7   Diabetes Maternal Uncle        Type 1   Stroke Maternal Grandmother    Colon cancer Neg Hx    Esophageal cancer Neg Hx    Stomach cancer Neg Hx    Rectal cancer Neg Hx     Social History   Socioeconomic History   Marital status: Single    Spouse name:  Not on file   Number of children: Not on file   Years of education: Not on file   Highest education level: Not on file  Occupational History   Occupation: admin    Comment: Psychologist, counselling  Tobacco Use   Smoking status: Former    Packs/day: 0.25    Years: 13.00    Total pack years: 3.25    Types: Cigarettes    Quit date: 04/24/1998    Years since quitting: 24.1   Smokeless tobacco: Never  Vaping Use   Vaping Use: Never used  Substance and Sexual Activity   Alcohol use: Not Currently   Drug use: No   Sexual activity: Yes    Partners: Male    Birth control/protection: None  Other Topics Concern   Not on file  Social History Narrative   Walking daily   Social Determinants of Health   Financial Resource Strain: Not on file  Food Insecurity: Not on file  Transportation Needs: Not on file  Physical Activity: Not on file  Stress: Not on file  Social Connections: Not on file     Vital Signs: There were no vitals taken for this visit.  No physical examination was performed in lieu of virtual telephone clinic visit.   Imaging: US Thyroid 12/25/21   Nodule volume = 0.59 cc  US Thyroid 06/27/22   Nodule volume = 0.56 cc  Labs: CBC (10/06/21) WBC 4.6, Hb 12.5, Hct 38.4, Plt 263   Coags None recent.   TFTs (03/08/22) Serum TSH - 4.09        Serum free T4 - 0.89 Serum T3 - 113 Thyroperoxidase Antibody - 1 (10/20/21) Thyroglobulin Antibody - not available Calcitonin - not available     Prior Thyroid FNA: 01/22/22 FINAL MICROSCOPIC DIAGNOSIS:  Consistent with benign follicular nodule (Bethesda category II)        Assessment and Plan: 51 year old female with mildly symptomatic, relatively small, benign right inferior thyroid nodule in the setting of thyroid dysregulation of uncertain etiology, likely thyroiditis.  Currently hypothyroid requiring low dose thyroid supplementation.   Given relative lack of bulk symptoms at this time and unchanged small size  (0.59 cc --> 0.56 cc over 6 months),  continued watchful waiting is indicated.     We did previously discuss that her dysphagia and sensation of having to clear her throat may be related to the presence of the nodule.  If the nodule were to grow significantly with concomitant worsening of these symptoms, she may be a great candidate for thyroid RFA at that time.     Plan to follow up in 1 year with repeat thyroid ultrasound to assess for change in current nodule.  Electronically Signed: Suzette Battiest 06/29/2022, 8:28 AM   I spent a total of 25 Minutes in virtual telephone clinical consultation, greater than 50% of which was counseling/coordinating care for thyroid nodule.

## 2022-07-10 ENCOUNTER — Encounter: Payer: Self-pay | Admitting: Internal Medicine

## 2022-07-10 ENCOUNTER — Ambulatory Visit: Payer: Commercial Managed Care - HMO | Admitting: Internal Medicine

## 2022-07-10 VITALS — BP 112/72 | HR 74 | Ht 63.75 in | Wt 176.0 lb

## 2022-07-10 DIAGNOSIS — E041 Nontoxic single thyroid nodule: Secondary | ICD-10-CM | POA: Diagnosis not present

## 2022-07-10 DIAGNOSIS — E069 Thyroiditis, unspecified: Secondary | ICD-10-CM

## 2022-07-10 LAB — TSH: TSH: 6.99 u[IU]/mL — ABNORMAL HIGH (ref 0.35–5.50)

## 2022-07-10 LAB — T4, FREE: Free T4: 0.68 ng/dL (ref 0.60–1.60)

## 2022-07-10 NOTE — Progress Notes (Signed)
Name: Cindy Christian  MRN/ DOB: [id number], 01-Jan-1971    Age/ Sex: 51 y.o., female    PCP: Flossie Buffy, NP   Reason for Endocrinology Evaluation: Hyperthyrodism     Date of Initial Endocrinology Evaluation: 12/08/2021    HPI: Cindy Christian is a 51 y.o. female with a past medical history of A. Fib. The patient presented for initial endocrinology clinic visit on 12/08/2021 for consultative assistance with her Hyperthyroidism.   Pt is here for further evaluation of hyperthyroidism    She was noted with an elevated TSH in 2021 with a max level of 5.209 uIU/mL. But in 2023 she has been noted with suppressed TSH < 0.01 uIU/mL with a low total T4.    Of note, the pt has a Hx of A. Fib S/P cardio ablation   She was on Amiodarone for 6 months in 2021 No prior exposure to radiation   Repeat TFT's at our clinic showed hypothyroidism which is consistent with subacute thyroiditis and was started on Levothyroxine 11/2021 TRAb undetectable  Thyroid ultrasound  (12/25/2021) revealed a solitary thyroid nodule 1.7 cm meeting FNA criteria. She is S/P benign FNA 01/12/2022    She is NOT a candidate for radiofrequency ablation    No FH of thyroid disease    SUBJECTIVE:     Today (07/10/22): Cindy Christian is here for a follow up on Thyroid disease.  She has stopped taking levothyroxine 06/18/2022  Weight has been stable  Has occasional dysphagia, has  not noted any enlargement  Denies constipation  She feels less achy that she attributes to Levothyroxine      HISTORY:  Past Medical History:  Past Medical History:  Diagnosis Date   Allergy    seasonal allergies   Atrial fibrillation with RVR ([place]) 03/25/2020   had ablation-NSR    Borderline glaucoma    popa   Thyroiditis 12/08/2021   Past Surgical History:  Past Surgical History:  Procedure Laterality Date   ABLATION  03/25/2020   ATRIAL FIBRILLATION ABLATION N/A 03/25/2020   Procedure: ATRIAL FIBRILLATION ABLATION;   Surgeon: Constance Haw, MD;  Location: [facility] [facility];  Service: Cardiovascular;  Laterality: N/A;   BUBBLE STUDY  12/30/2019   Procedure: BUBBLE STUDY;  Surgeon: Elouise Munroe, MD;  Location: [place];  Service: Cardiovascular;;   CARDIOVERSION N/A 12/30/2019   Procedure: CARDIOVERSION;  Surgeon: Elouise Munroe, MD;  Location: [place];  Service: Cardiovascular;  Laterality: N/A;   FINGER FRACTURE SURGERY Left 1983   middle finger   IR RADIOLOGIST EVAL & MGMT  05/01/2022   IR RADIOLOGIST EVAL & MGMT  06/29/2022   KNEE ARTHROPLASTY Bilateral 2000 R 2008 L   LEEP  2004   CIN 1-2   TEE WITHOUT CARDIOVERSION N/A 12/30/2019   Procedure: TRANSESOPHAGEAL ECHOCARDIOGRAM (TEE);  Surgeon: Elouise Munroe, MD;  Location: [facility] ENDOSCOPY;  Service: Cardiovascular;  Laterality: N/A;   WISDOM TOOTH EXTRACTION      Social History:  reports that she quit smoking about 24 years ago. Her smoking use included cigarettes. She has a 3.25 pack-year smoking history. She has never used smokeless tobacco. She reports that she does not currently use alcohol. She reports that she does not use drugs. Family History: family history includes Colon polyps (age of onset: 83) in her mother; Diabetes in her maternal uncle; Stroke in her maternal grandmother.   HOME MEDICATIONS: Allergies as of 07/10/2022       Reactions  Penicillins Other (See Comments)   Childhood allergy Did it involve swelling of the face/tongue/throat, SOB, or low BP? Unknown Did it involve sudden or severe rash/hives, skin peeling, or any reaction on the inside of your mouth or nose? Unknown Did you need to seek medical attention at a hospital or doctor's office? Unknown When did it last happen?     young child  If all above answers are "NO", may proceed with cephalosporin use.        Medication List        Accurate as of July 10, 2022  8:25 AM. If you have any questions, ask your nurse or doctor.           STOP taking these medications    cetirizine 10 MG tablet Commonly known as: ZYRTEC Stopped by: Dorita Sciara, MD       TAKE these medications    levothyroxine 25 MCG tablet Commonly known as: SYNTHROID Take 1 tablet (25 mcg total) by mouth daily.   OVER THE COUNTER MEDICATION Place 1 drop into both eyes daily. Simalasin Allergy Eye Relief - homeopathic          OBJECTIVE:  VS: BP 112/72 (BP Location: Left Arm, Patient Position: Sitting, Cuff Size: Small)   Pulse 74   Ht 5' 3.75" (1.619 m)   Wt 176 lb (79.8 kg)   SpO2 97%   BMI 30.45 kg/m    Wt Readings from Last 3 Encounters:  07/10/22 176 lb (79.8 kg)  03/08/22 174 lb 12.8 oz (79.3 kg)  12/08/21 166 lb (75.3 kg)     EXAM: General: Pt appears well and is in NAD  Eyes: External eye exam normal without stare, lid lag or exophthalmos.  EOM intact.   Neck: General: Supple without adenopathy. Thyroid: Thyroid size normal.  No nodules appreciated.   Lungs: Clear with good BS bilat with no rales, rhonchi, or wheezes  Heart: Auscultation: RRR.  Abdomen: Normoactive bowel sounds, soft, nontender, without masses or organomegaly palpable  Extremities:  BL LE: No pretibial edema normal ROM and strength.  Mental Status: Judgment, insight: Intact Orientation: Oriented to time, place, and person Mood and affect: No depression, anxiety, or agitation     DATA REVIEWED:   Latest Reference Range & Units 07/10/22 08:30  TSH 0.35 - 5.50 uIU/mL 6.99 (H)  T4,Free(Direct) 0.60 - 1.60 ng/dL 0.68      Latest Reference Range & Units 03/08/22 09:04  Sodium 135 - 145 mEq/L 141  Potassium 3.5 - 5.1 mEq/L 4.5  Chloride 96 - 112 mEq/L 103  CO2 19 - 32 mEq/L 30  Glucose 70 - 99 mg/dL 84  BUN 6 - 23 mg/dL 13  Creatinine 0.40 - 1.20 mg/dL 0.67  Calcium 8.4 - 10.5 mg/dL 10.3  Albumin 3.5 - 5.2 g/dL 4.7  GFR >60.00 mL/min 100.57    Latest Reference Range & Units 03/08/22 09:04  VITD 30.00 - 100.00 ng/mL 40.46     Thyroid Ultrasound 06/27/2022 Estimated total number of nodules >/= 1 cm: 1   Number of spongiform nodules >/=  2 cm not described below (TR1): 0   Number of mixed cystic and solid nodules >/= 1.5 cm not described below ([place]): 0   _________________________________________________________   Nodule # 1: The previously biopsied nodule at the posteroinferior aspect of the right gland measures 1.6 x 0.8 x 0.8 cm which is insignificantly changed compared to prior.   There are no new nodules or suspicious features.   IMPRESSION:  Stable previously biopsied nodule at the inferior and posterior aspect of the right thyroid gland. Recommend correlation with prior biopsy results.   Small thyroid gland. No other thyroid nodules or suspicious features identified.     FNA right nodule 01/12/2022  Clinical History: Thyroid right inferior lobe nodule  Specimen Submitted:  A. THYROID, RIGHT INFERIOR, FINE NEEDLE ASPIRATION:    FINAL MICROSCOPIC DIAGNOSIS:  - Consistent with benign follicular nodule (Bethesda category II)   ASSESSMENT/PLAN/RECOMMENDATIONS:   Hypothyroidism:  -This followed a subacute thyroiditis phase - She has self discontinued LT-4 replacement last month  - She is clinically euthyorid  -TSH elevated 6.99 u U IU/mL, given that she is asymptomatic and her TSH is <10.0uIU/mL , we may hold off on restarting levothyroxine at this time and recheck in 6-12 months   2. Right Thyroid Nodule :  -She is s/p FNA of the right 1.7 cm thyroid nodule with benign cytology - She will continue to follow up with Dr. Serafina Royals as she is interested in radiofrequency ablation if needed in the future      Signed electronically by: Mack Guise, MD  [facility]  [facility] [address] [place], [place] [address] Phone: [phone] FAX: [phone]   CC: Flossie Buffy, NP [place]  [id number] Phone: [phone] Fax: [phone]   Return to Endocrinology clinic as below: Future Appointments  Date Time Provider [place]  10/08/2022  8:20 AM Nche, Charlene Brooke, NP [id number]

## 2022-10-05 ENCOUNTER — Encounter: Payer: Commercial Managed Care - HMO | Admitting: Nurse Practitioner

## 2022-10-08 ENCOUNTER — Ambulatory Visit (INDEPENDENT_AMBULATORY_CARE_PROVIDER_SITE_OTHER): Payer: Commercial Managed Care - HMO | Admitting: Nurse Practitioner

## 2022-10-08 ENCOUNTER — Encounter: Payer: Self-pay | Admitting: Nurse Practitioner

## 2022-10-08 VITALS — BP 118/80 | HR 73 | Temp 97.9°F | Ht 63.0 in | Wt 177.4 lb

## 2022-10-08 DIAGNOSIS — Z0001 Encounter for general adult medical examination with abnormal findings: Secondary | ICD-10-CM | POA: Diagnosis not present

## 2022-10-08 DIAGNOSIS — Z136 Encounter for screening for cardiovascular disorders: Secondary | ICD-10-CM | POA: Diagnosis not present

## 2022-10-08 DIAGNOSIS — Z13220 Encounter for screening for lipoid disorders: Secondary | ICD-10-CM

## 2022-10-08 DIAGNOSIS — E069 Thyroiditis, unspecified: Secondary | ICD-10-CM

## 2022-10-08 DIAGNOSIS — I4819 Other persistent atrial fibrillation: Secondary | ICD-10-CM

## 2022-10-08 DIAGNOSIS — R1012 Left upper quadrant pain: Secondary | ICD-10-CM | POA: Insufficient documentation

## 2022-10-08 DIAGNOSIS — Z1231 Encounter for screening mammogram for malignant neoplasm of breast: Secondary | ICD-10-CM | POA: Diagnosis not present

## 2022-10-08 LAB — LIPID PANEL
Cholesterol: 201 mg/dL — ABNORMAL HIGH (ref 0–200)
HDL: 55.9 mg/dL (ref >39.00)
LDL Cholesterol: 126 mg/dL — ABNORMAL HIGH (ref 0–99)
NonHDL: 144.75
Total CHOL/HDL Ratio: 4
Triglycerides: 94 mg/dL (ref 0.0–149.0)
VLDL: 18.8 mg/dL (ref 0.0–40.0)

## 2022-10-08 LAB — COMPREHENSIVE METABOLIC PANEL
ALT: 17 U/L (ref 0–35)
AST: 22 U/L (ref 0–37)
Albumin: 4.3 g/dL (ref 3.5–5.2)
Alkaline Phosphatase: 66 U/L (ref 39–117)
BUN: 13 mg/dL (ref 6–23)
CO2: 29 mEq/L (ref 19–32)
Calcium: 9.3 mg/dL (ref 8.4–10.5)
Chloride: 104 mEq/L (ref 96–112)
Creatinine, Ser: 0.55 mg/dL (ref 0.40–1.20)
GFR: 105.03 mL/min (ref >60.00)
Glucose, Bld: 82 mg/dL (ref 70–99)
Potassium: 3.5 mEq/L (ref 3.5–5.1)
Sodium: 140 mEq/L (ref 135–145)
Total Bilirubin: 0.6 mg/dL (ref 0.2–1.2)
Total Protein: 7.3 g/dL (ref 6.0–8.3)

## 2022-10-08 NOTE — Patient Instructions (Signed)
Go to lab Maintain heart healthy diet and daily exercise  Preventive Care 40-53 Years Old, Female Preventive care refers to lifestyle choices and visits with your health care provider that can promote health and wellness. Preventive care visits are also called wellness exams. What can I expect for my preventive care visit? Counseling Your health care provider may ask you questions about your: Medical history, including: Past medical problems. Family medical history. Pregnancy history. Current health, including: Menstrual cycle. Method of birth control. Emotional well-being. Home life and relationship well-being. Sexual activity and sexual health. Lifestyle, including: Alcohol, nicotine or tobacco, and drug use. Access to firearms. Diet, exercise, and sleep habits. Work and work environment. Sunscreen use. Safety issues such as seatbelt and bike helmet use. Physical exam Your health care provider will check your: Height and weight. These may be used to calculate your BMI (body mass index). BMI is a measurement that tells if you are at a healthy weight. Waist circumference. This measures the distance around your waistline. This measurement also tells if you are at a healthy weight and may help predict your risk of certain diseases, such as type 2 diabetes and high blood pressure. Heart rate and blood pressure. Body temperature. Skin for abnormal spots. What immunizations do I need?  Vaccines are usually given at various ages, according to a schedule. Your health care provider will recommend vaccines for you based on your age, medical history, and lifestyle or other factors, such as travel or where you work. What tests do I need? Screening Your health care provider may recommend screening tests for certain conditions. This may include: Lipid and cholesterol levels. Diabetes screening. This is done by checking your blood sugar (glucose) after you have not eaten for a while  (fasting). Pelvic exam and Pap test. Hepatitis B test. Hepatitis C test. HIV (human immunodeficiency virus) test. STI (sexually transmitted infection) testing, if you are at risk. Lung cancer screening. Colorectal cancer screening. Mammogram. Talk with your health care provider about when you should start having regular mammograms. This may depend on whether you have a family history of breast cancer. BRCA-related cancer screening. This may be done if you have a family history of breast, ovarian, tubal, or peritoneal cancers. Bone density scan. This is done to screen for osteoporosis. Talk with your health care provider about your test results, treatment options, and if necessary, the need for more tests. Follow these instructions at home: Eating and drinking  Eat a diet that includes fresh fruits and vegetables, whole grains, lean protein, and low-fat dairy products. Take vitamin and mineral supplements as recommended by your health care provider. Do not drink alcohol if: Your health care provider tells you not to drink. You are pregnant, may be pregnant, or are planning to become pregnant. If you drink alcohol: Limit how much you have to 0-1 drink a day. Know how much alcohol is in your drink. In the [place] one drink equals one 12 oz bottle of beer (355 mL), one 5 oz glass of wine (148 mL), or one 1 oz glass of hard liquor (44 mL). Lifestyle Brush your teeth every morning and night with fluoride toothpaste. Floss one time each day. Exercise for at least 30 minutes 5 or more days each week. Do not use any products that contain nicotine or tobacco. These products include cigarettes, chewing tobacco, and vaping devices, such as e-cigarettes. If you need help quitting, ask your health care provider. Do not use drugs. If you are sexually active, practice   safe sex. Use a condom or other form of protection to prevent STIs. If you do not wish to become pregnant, use a form of birth control. If  you plan to become pregnant, see your health care provider for a prepregnancy visit. Take aspirin only as told by your health care provider. Make sure that you understand how much to take and what form to take. Work with your health care provider to find out whether it is safe and beneficial for you to take aspirin daily. Find healthy ways to manage stress, such as: Meditation, yoga, or listening to music. Journaling. Talking to a trusted person. Spending time with friends and family. Minimize exposure to UV radiation to reduce your risk of skin cancer. Safety Always wear your seat belt while driving or riding in a vehicle. Do not drive: If you have been drinking alcohol. Do not ride with someone who has been drinking. When you are tired or distracted. While texting. If you have been using any mind-altering substances or drugs. Wear a helmet and other protective equipment during sports activities. If you have firearms in your house, make sure you follow all gun safety procedures. Seek help if you have been physically or sexually abused. What's next? Visit your health care provider once a year for an annual wellness visit. Ask your health care provider how often you should have your eyes and teeth checked. Stay up to date on all vaccines. This information is not intended to replace advice given to you by your health care provider. Make sure you discuss any questions you have with your health care provider. Document Revised: 03/15/2021 Document Reviewed: 03/15/2021 Elsevier Patient Education  2023 Elsevier Inc.  

## 2022-10-08 NOTE — Assessment & Plan Note (Addendum)
Chronic, onset >56yr ago, intermittent, triggered by twisting movement, improves with stretching. Not related to food intake, no nausea, no change in GI/GU function, no hx of kidney stones or gallstones or pancreatitis. Ct ABD/pelvis and ABD UKoreadone 2021: no acute finding.  Normal chest and ABD exam today Possible due to muscle strain and spasms Advised to use ibuprofen as needed for pain Ok to alternated between warm and cold compress as needed

## 2022-10-08 NOTE — Progress Notes (Signed)
Complete physical exam  Patient: Cindy Christian   DOB: August 07, 1971   52 y.o. Female  MRN: [id number] Visit Date: 10/08/2022  Subjective:    Chief Complaint  Patient presents with   Annual Exam    CPE Pt fasting Possible PAP   Cindy Christian is a 52 y.o. female who presents today for a complete physical exam. She reports consuming a general diet.  Walking daily  She generally feels well. She reports sleeping well. She does have additional problems to discuss today.  Vision:Yes Dental:Yes STD Screen:No  Wt Readings from Last 3 Encounters:  10/08/22 177 lb 6.4 oz (80.5 kg)  07/10/22 176 lb (79.8 kg)  03/08/22 174 lb 12.8 oz (79.3 kg)    BP Readings from Last 3 Encounters:  10/08/22 118/80  07/10/22 112/72  03/08/22 110/66    Most recent fall risk assessment:    10/06/2021    9:39 AM  Fall Risk   Falls in the past year? 0  Number falls in past yr: 0  Injury with Fall? 0  Risk for fall due to : No Fall Risks  Follow up Falls evaluation completed   Depression screen:Yes - No Depression  Most recent depression screenings:    10/08/2022    9:09 AM 10/06/2021   10:13 AM  PHQ 2/9 Scores  PHQ - 2 Score 0 0  PHQ- 9 Score 2 2   HPI  Atrial fibrillation (HCC) No palpitation, bo SOB, no Syncope, no CP No anticoagulant at this time Regular rhythm during exam today. Advised to schedule f/up with cardiology  Thyroiditis Under the care of endocrinology Benign thyroid nodule Repeat thyroid panel today  LUQ cramping Chronic, onset >45yr ago, intermittent, triggered by twisting movement, improves with stretching. Not related to food intake, no nausea, no change in GI/GU function, no hx of kidney stones or gallstones or pancreatitis. Ct ABD/pelvis and ABD UKoreadone 2021: no acute finding.  Normal chest and ABD exam today Possible due to muscle strain and spasms Advised to use ibuprofen as needed for pain Ok to alternated between warm and cold compress as needed  Past Medical  History:  Diagnosis Date   Allergy    seasonal allergies   [place] 03/25[place]   had ablation-NSR    Borderline glaucoma    pop[place]   Thyroiditis 12/08/2021   Past Surgical History:  Procedure Laterality Date   ABLATION  03/25/2020   ATRIAL FIBRILLATION ABLATION N/A 03/25/2020   Procedure: ATRIAL FIBRILLATION ABLATION;  Surgeon: CConstance Haw MD;  [facility];  Service: Cardiovascular;  Laterality: N/A;   BUBBLE STUDY  12/30/2019   Procedure: BUBBLE STUDY;  Surgeon: AElouise Munroe MD;  [facility]  Service: Cardiovascular;;   CARDIOVERSION N/A 12/30/2019   Procedure: CARDIOVERSION;  Surgeon: AElouise Munroe MD;  [place] [facility]  Service: Cardiovascular;  Laterality: N/A;   FINGER FRACTURE SURGERY Left 1983   middle finger   IR RADIOLOGIST EVAL & MGMT  05/01/2022   IR RADIOLOGIST EVAL & MGMT  06/29/2022   KNEE ARTHROPLASTY Bilateral 2000 R 2008 L   LEEP  2004   CIN 1-2   TEE WITHOUT CARDIOVERSION N/A 12/30/2019   Procedure: TRANSESOPHAGEAL ECHOCARDIOGRAM (TEE);  Surgeon: AElouise Munroe MD;  [facility];  Service: Cardiovascular;  Laterality: N/A;   WISDOM TOOTH EXTRACTION     Social History   Socioeconomic History   Marital status: Single    Spouse name: Not  on file   Number of children: Not on file   Years of education: Not on file   Highest education level: Not on file  Occupational History   Occupation: admin    Comment: Psychologist, counselling  Tobacco Use   Smoking status: Former    Packs/day: 0.25    Years: 13.00    Total pack years: 3.25    Types: Cigarettes    Quit date: 04/24/1998    Years since quitting: 24.4   Smokeless tobacco: Never  Vaping Use   Vaping Use: Never used  Substance and Sexual Activity   Alcohol use: Not Currently   Drug use: No   Sexual activity: Yes    Partners: Male    Birth control/protection: None  Other Topics Concern   Not on file  Social  History Narrative   Walking daily   Social Determinants of Health   Financial Resource Strain: Not on file  Food Insecurity: Not on file  Transportation Needs: Not on file  Physical Activity: Not on file  Stress: Not on file  Social Connections: Not on file  Intimate Partner Violence: Not on file   Family Status  Relation Name Status   Mother  Alive   Father  Alive   Mat Uncle T1 (Not Specified)   MGM E1 (Not Specified)   Neg Hx  (Not Specified)   Family History  Problem Relation Age of Onset   Colon polyps Mother 74   Diabetes Maternal Uncle        Type 1   Stroke Maternal Grandmother    Colon cancer Neg Hx    Esophageal cancer Neg Hx    Stomach cancer Neg Hx    Rectal cancer Neg Hx    Allergies  Allergen Reactions   Penicillins Other (See Comments)    Childhood allergy Did it involve swelling of the face/tongue/throat, SOB, or low BP? Unknown Did it involve sudden or severe rash/hives, skin peeling, or any reaction on the inside of your mouth or nose? Unknown Did you need to seek medical attention at a hospital or doctor's office? Unknown When did it last happen?     young child  If all above answers are "NO", may proceed with cephalosporin use.    Patient Care Team: Cindy Christian, Cindy Brooke, NP as PCP - General (Internal Medicine) Cindy Haw, MD as PCP - Electrophysiology (Cardiology) Cindy Christian, Cindy Freer, MD as PCP - Cardiology (Cardiology) Cindy Christian, OD as Referring Physician (Optometry)   Medications: Outpatient Medications Prior to Visit  Medication Sig   [DISCONTINUED] OVER THE COUNTER MEDICATION Place 1 drop into both eyes daily. Simalasin Allergy Eye Relief - homeopathic   No facility-administered medications prior to visit.    Review of Systems  Constitutional:  Negative for fever.  HENT:  Negative for congestion and sore throat.   Eyes:        Negative for visual changes  Respiratory:  Negative for cough and shortness of breath.    Cardiovascular:  Negative for chest pain, palpitations and leg swelling.  Gastrointestinal:  Negative for blood in stool, constipation and diarrhea.  Genitourinary:  Negative for dysuria, frequency and urgency.  Musculoskeletal:  Negative for myalgias.  Skin:  Negative for rash.  Neurological:  Negative for dizziness and headaches.  Hematological:  Does not bruise/bleed easily.  Psychiatric/Behavioral:  Negative for suicidal ideas. The patient is not nervous/anxious.        Objective:  BP 118/80 (BP Location: Right Arm, Patient Position: Sitting,  Cuff Size: Normal)   Pulse 73   Temp 97.9 F (36.6 C) (Temporal)   Ht '5\' 3"'$  (1.6 m)   Wt 177 lb 6.4 oz (80.5 kg)   LMP 06/04/2022 (Exact Date)   SpO2 97%   BMI 31.42 kg/m     Physical Exam Vitals and nursing note reviewed.  Constitutional:      General: She is not in acute distress. HENT:     Right Ear: Tympanic membrane, ear canal and external ear normal.     Left Ear: Tympanic membrane, ear canal and external ear normal.     Nose: Nose normal.     Mouth/Throat:     Pharynx: No oropharyngeal exudate.  Eyes:     General: No scleral icterus.    Extraocular Movements: Extraocular movements intact.     Conjunctiva/sclera: Conjunctivae normal.     Pupils: Pupils are equal, round, and reactive to light.  Cardiovascular:     Rate and Rhythm: Normal rate and regular rhythm.     Pulses: Normal pulses.     Heart sounds: Normal heart sounds.  Pulmonary:     Effort: Pulmonary effort is normal. No respiratory distress.     Breath sounds: Normal breath sounds.  Chest:  Breasts:    Breasts are symmetrical.     Right: Normal.     Left: Normal.  Abdominal:     General: Bowel sounds are normal. There is no distension.     Palpations: Abdomen is soft.  Musculoskeletal:        General: Normal range of motion.     Cervical back: Normal range of motion and neck supple.     Right lower leg: No edema.     Left lower leg: No edema.   Lymphadenopathy:     Cervical: No cervical adenopathy.     Upper Body:     Right upper body: No supraclavicular, axillary or pectoral adenopathy.     Left upper body: No supraclavicular, axillary or pectoral adenopathy.  Skin:    General: Skin is warm and dry.  Neurological:     Mental Status: She is alert and oriented to person, place, and time.     Cranial Nerves: No cranial nerve deficit.     Motor: No weakness.  Psychiatric:        Mood and Affect: Mood normal.        Behavior: Behavior normal.        Thought Content: Thought content normal.     No results found for any visits on 10/08/22.    Assessment & Plan:    Routine Health Maintenance and Physical Exam  Immunization History  Administered Date(s) Administered   PFIZER(Purple Top)SARS-COV-2 Vaccination 07/03/2020, 07/24/2020   Health Maintenance  Topic Date Due   Hepatitis C Screening  Never done   COVID-19 Vaccine (3 - 2023-24 season) 10/24/2022 (Originally 06/01/2022)   INFLUENZA VACCINE  12/30/2022 (Originally 05/01/2022)   Zoster Vaccines- Shingrix (1 of 2) 01/07/2023 (Originally 11/15/2019)   MAMMOGRAM  10/14/2023   PAP SMEAR-Modifier  10/06/2024   COLONOSCOPY (Pts 45-49yr Insurance coverage will need to be confirmed)  08/12/2030   HIV Screening  Completed   HPV VACCINES  Aged Out   DTaP/Tdap/Td  Discontinued   Discussed health benefits of physical activity, and encouraged her to engage in regular exercise appropriate for her age and condition.  Problem List Items Addressed This Visit       Cardiovascular and Mediastinum   [place]rial [place]  No palpitation, bo SOB, no Syncope, no CP No anticoagulant at this time Regular rhythm during exam today. Advised to schedule f/up with cardiology        Endocrine   Thyroiditis    Under the care of endocrinology Benign thyroid nodule Repeat thyroid panel today      Relevant Orders   Thyroid Panel With TSH     Other   LUQ cramping    Chronic,  onset >25yr ago, intermittent, triggered by twisting movement, improves with stretching. Not related to food intake, no nausea, no change in GI/GU function, no hx of kidney stones or gallstones or pancreatitis. Ct ABD/pelvis and ABD UKoreadone 2021: no acute finding.  Normal chest and ABD exam today Possible due to muscle strain and spasms Advised to use ibuprofen as needed for pain Ok to alternated between warm and cold compress as needed      Other Visit Diagnoses     Encounter for preventative adult health care exam with abnormal findings    -  Primary   Relevant Orders   Comprehensive metabolic panel   Breast cancer screening by mammogram       Relevant Orders   MM 3D SCREEN BREAST BILATERAL   Encounter for lipid screening for cardiovascular disease       Relevant Orders   Lipid panel      Return in about 1 year (around 10/09/2023) for CPE (fasting).     CWilfred Lacy NP

## 2022-10-08 NOTE — Assessment & Plan Note (Signed)
Under the care of endocrinology Benign thyroid nodule Repeat thyroid panel today

## 2022-10-08 NOTE — Assessment & Plan Note (Addendum)
No palpitation, bo SOB, no Syncope, no CP No anticoagulant at this time Regular rhythm during exam today. Advised to schedule f/up with cardiology

## 2022-10-09 LAB — THYROID PANEL WITH TSH
Free Thyroxine Index: 1.8 (ref 1.4–3.8)
T3 Uptake: 29 % (ref 22–35)
T4, Total: 6.1 ug/dL (ref 5.1–11.9)
TSH: 4.31 mIU/L

## 2023-01-15 ENCOUNTER — Other Ambulatory Visit (HOSPITAL_BASED_OUTPATIENT_CLINIC_OR_DEPARTMENT_OTHER): Payer: Self-pay | Admitting: Nurse Practitioner

## 2023-01-15 DIAGNOSIS — Z1231 Encounter for screening mammogram for malignant neoplasm of breast: Secondary | ICD-10-CM

## 2023-02-04 ENCOUNTER — Encounter (HOSPITAL_BASED_OUTPATIENT_CLINIC_OR_DEPARTMENT_OTHER): Payer: Self-pay

## 2023-02-04 ENCOUNTER — Ambulatory Visit (HOSPITAL_BASED_OUTPATIENT_CLINIC_OR_DEPARTMENT_OTHER)
Admission: RE | Admit: 2023-02-04 | Discharge: 2023-02-04 | Disposition: A | Discharge status: Home / Self Care | Payer: Commercial Managed Care - HMO | Source: Ambulatory Visit | Attending: Nurse Practitioner | Admitting: Nurse Practitioner

## 2023-02-04 DIAGNOSIS — Z1231 Encounter for screening mammogram for malignant neoplasm of breast: Secondary | ICD-10-CM | POA: Diagnosis not present

## 2023-05-22 ENCOUNTER — Ambulatory Visit (INDEPENDENT_AMBULATORY_CARE_PROVIDER_SITE_OTHER): Payer: Commercial Managed Care - HMO | Admitting: Obstetrics and Gynecology

## 2023-05-22 ENCOUNTER — Encounter: Payer: Self-pay | Admitting: Obstetrics and Gynecology

## 2023-05-22 ENCOUNTER — Other Ambulatory Visit (HOSPITAL_COMMUNITY)
Admission: RE | Admit: 2023-05-22 | Payer: Commercial Managed Care - HMO | Source: Ambulatory Visit | Admitting: Obstetrics and Gynecology

## 2023-05-22 VITALS — BP 148/75 | HR 76 | Ht 63.75 in | Wt 178.0 lb

## 2023-05-22 DIAGNOSIS — Z124 Encounter for screening for malignant neoplasm of cervix: Secondary | ICD-10-CM | POA: Diagnosis not present

## 2023-05-22 DIAGNOSIS — Z01419 Encounter for gynecological examination (general) (routine) without abnormal findings: Secondary | ICD-10-CM | POA: Diagnosis present

## 2023-05-22 DIAGNOSIS — Z1339 Encounter for screening examination for other mental health and behavioral disorders: Secondary | ICD-10-CM | POA: Diagnosis not present

## 2023-05-22 NOTE — Progress Notes (Unsigned)
Perimenopausal. (Has gone 8-9 months  Patient up to date on mammogram May 2024.

## 2023-05-22 NOTE — Progress Notes (Unsigned)
ANNUAL EXAM Patient name: Cindy Christian MRN [id number]  Date of birth: Dec 10, 1970 Chief Complaint:   Gynecologic Exam  History of Present Illness:   Cindy Christian is a 52 y.o. G0P0000 being seen today for a routine annual exam.  Current complaints: annual, concern for menopause  Menstrual concerns? Yes   Breast or nipple changes? No  Contraception use? Yes abstinence Sexually active? No no, had been having vaginal dryness for a long time and then vagina "turning young" more recently Will go several months (8-9) without a menses and then have a cycle, not quite a full year without one  Has been having insomnia Minimal hot flashes now; very quick for about 6 months  LEEP in early 30s  Patient's last menstrual period was 03/28/2023 (exact date).   The pregnancy intention screening data noted above was reviewed. Potential methods of contraception were discussed. The patient elected to proceed with No data recorded.   Last pap     Component Value Date/Time   DIAGPAP  10/06/2021 1017    - Negative for Intraepithelial Lesions or Malignancy (NILM)   DIAGPAP - Benign reactive/reparative changes 10/06/2021 1017   DIAGPAP  10/29/2019 1142    - Negative for intraepithelial lesion or malignancy (NILM)   HPVHIGH Negative 10/06/2021 1017   HPVHIGH Negative 10/29/2019 1142   ADEQPAP  10/06/2021 1017    Satisfactory for evaluation; transformation zone component PRESENT.   ADEQPAP  10/29/2019 1142    Satisfactory for evaluation; transformation zone component PRESENT.   ADEQPAP  08/02/2017 0000    Satisfactory for evaluation  endocervical/transformation zone component PRESENT.   Last mammogram: 5/20204 BIRADS 1 Last colonoscopy: 08/2020.      05/22/2023    2:37 PM 10/08/2022    9:09 AM 10/06/2021   10:13 AM 10/29/2019   10:48 AM  Depression screen PHQ 2/9  Decreased Interest 0 0 0 0  Down, Depressed, Hopeless 0 0 0 0  PHQ - 2 Score 0 0 0 0  Altered sleeping 1 0 1   Tired, decreased energy  1 0 1   Change in appetite 0 0 0   Feeling bad or failure about yourself  0 0 0   Trouble concentrating 0 1 0   Moving slowly or fidgety/restless 0 1 0   Suicidal thoughts 0 0 0   PHQ-9 Score 2 2 2    Difficult doing work/chores  Not difficult at all Not difficult at all         05/22/2023    2:37 PM 10/08/2022    9:09 AM 10/06/2021   10:14 AM  GAD 7 : Generalized Anxiety Score  Nervous, Anxious, on Edge 0 0 3  Control/stop worrying 0 0 0  Worry too much - different things 0 0 0  Trouble relaxing 1 0 2  Restless 0 0 0  Easily annoyed or irritable 0 0 0  Afraid - awful might happen 0 0 3  Total GAD 7 Score 1 0 8  Anxiety Difficulty  Not difficult at all Somewhat difficult     Review of Systems:   Pertinent items are noted in HPI Denies any headaches, blurred vision, fatigue, shortness of breath, chest pain, abdominal pain, abnormal vaginal discharge/itching/odor/irritation, problems with periods, bowel movements, urination, or intercourse unless otherwise stated above. Pertinent History Reviewed:  Reviewed past medical,surgical, social and family history.  Reviewed problem list, medications and allergies. Physical Assessment:   Vitals:   05/22/23 1430  BP: (!) 148/75  Pulse: 76  Weight: 178 lb (80.7 kg)  Height: 5' 3.75" (1.619 m)  Body mass index is 30.79 kg/m.        Physical Examination:   General appearance - well appearing, and in no distress  Mental status - alert, oriented to person, place, and time  Psych:  She has a normal mood and affect  Skin - warm and dry, normal color, no suspicious lesions noted  Chest - effort normal, all lung fields clear to auscultation bilaterally  Heart - normal rate and regular rhythm  Breasts - breasts appear normal, no suspicious masses, no skin or nipple changes or  axillary nodes  Abdomen - soft, nontender, nondistended, no masses or organomegaly  Pelvic -  VULVA: normal appearing vulva with no masses, tenderness or lesions    VAGINA: normal appearing vagina with normal color and discharge, no lesions   CERVIX: normal appearing cervix without discharge or lesions, no CMT  Thin prep pap is done with HR HPV cotesting  UTERUS: uterus is felt to be normal size, shape, consistency and nontender   ADNEXA: No adnexal masses or tenderness noted.  Extremities:  No swelling or varicosities noted  Chaperone present for exam  No results found for this or any previous visit (from the past 24 hour(s)).    Assessment & Plan:   1. Well woman exam with routine gynecological exam - Cervical cancer screening: Discussed guidelines. Pap with HPV collected - Breast Health: Encouraged self breast awareness/SBE. Discussed limits of clinical breast exam for detecting breast cancer. Discussed importance of annual MXR. Up to date - Climacteric/Sexual health: Reviewed typical and atypical symptoms of menopause/peri-menopause. Undergoing perimenopausal changes and when 1 full year without menses, considered to be menopausal - Colonoscopy: up to date - F/U 12 months and prn  - Cytology - PAP( [facility])  2. Screening for cervical cancer Pap collected   No orders of the defined types were placed in this encounter.   Meds: No orders of the defined types were placed in this encounter.   Follow-up: Return for Annual GYN.  Lorriane Shire, MD 05/22/2023 2:54 PM

## 2023-05-22 NOTE — Progress Notes (Unsigned)
ANNUAL EXAM Patient name: Cindy Christian MRN [id number]  Date of birth: [birth year] Chief Complaint:   Gynecologic Exam  History of Present Illness:   Cindy Christian is a 52 y.o. G0P0000 being seen today for a routine annual exam.  Current complaints: ***  Menstrual concerns? {yes/no:20286}   Breast or nipple changes? {yes/no:20286}  Contraception use? {yes/no:20286}  Sexually active? {yes/no:20286}   Patient's last menstrual period was 03/28/2023 (exact date).   The pregnancy intention screening data noted above was reviewed. Potential methods of contraception were discussed. The patient elected to proceed with No data recorded.   Last pap     Component Value Date/Time   DIAGPAP  10/06/2021 1017    - Negative for Intraepithelial Lesions or Malignancy (NILM)   DIAGPAP - Benign reactive/reparative changes 10/06/2021 1017   DIAGPAP  10/29/2019 1142    - Negative for intraepithelial lesion or malignancy (NILM)   HPVHIGH Negative 10/06/2021 1017   HPVHIGH Negative 10/29/2019 1142   ADEQPAP  10/06/2021 1017    Satisfactory for evaluation; transformation zone component PRESENT.   ADEQPAP  10/29/2019 1142    Satisfactory for evaluation; transformation zone component PRESENT.   ADEQPAP  08/02/2017 0000    Satisfactory for evaluation  endocervical/transformation zone component PRESENT.     H/O abnormal pap: {yes/yes***/no:23866} Last mammogram: ***. @MAMMOASMT @  Last colonoscopy: ***.      05/22/2023    2:37 PM 10/08/2022    9:09 AM 10/06/2021   10:13 AM 10/29/2019   10:48 AM  Depression screen PHQ 2/9  Decreased Interest 0 0 0 0  Down, Depressed, Hopeless 0 0 0 0  PHQ - 2 Score 0 0 0 0  Altered sleeping 1 0 1   Tired, decreased energy 1 0 1   Change in appetite 0 0 0   Feeling bad or failure about yourself  0 0 0   Trouble concentrating 0 1 0   Moving slowly or fidgety/restless 0 1 0   Suicidal thoughts 0 0 0   PHQ-9 Score 2 2 2    Difficult doing work/chores  Not difficult at  all Not difficult at all         05/22/2023    2:37 PM 10/08/2022    9:09 AM 10/06/2021   10:14 AM  GAD 7 : Generalized Anxiety Score  Nervous, Anxious, on Edge 0 0 3  Control/stop worrying 0 0 0  Worry too much - different things 0 0 0  Trouble relaxing 1 0 2  Restless 0 0 0  Easily annoyed or irritable 0 0 0  Afraid - awful might happen 0 0 3  Total GAD 7 Score 1 0 8  Anxiety Difficulty  Not difficult at all Somewhat difficult     Review of Systems:   Pertinent items are noted in HPI Denies any headaches, blurred vision, fatigue, shortness of breath, chest pain, abdominal pain, abnormal vaginal discharge/itching/odor/irritation, problems with periods, bowel movements, urination, or intercourse unless otherwise stated above. Pertinent History Reviewed:  Reviewed past medical,surgical, social and family history.  Reviewed problem list, medications and allergies. Physical Assessment:   Vitals:   05/22/23 1430  BP: (!) 148/75  Pulse: 76  Weight: 178 lb (80.7 kg)  Height: 5' 3.75" (1.619 m)  Body mass index is 30.79 kg/m.        Physical Examination:   General appearance - well appearing, and in no distress  Mental status - alert, oriented to person, place, and time  Psych:  She  has a normal mood and affect  Skin - warm and dry, normal color, no suspicious lesions noted  Chest - effort normal, all lung fields clear to auscultation bilaterally  Heart - normal rate and regular rhythm  Breasts - breasts appear normal, no suspicious masses, no skin or nipple changes or  axillary nodes  Abdomen - soft, nontender, nondistended, no masses or organomegaly  Pelvic -  VULVA: normal appearing vulva with no masses, tenderness or lesions   VAGINA: normal appearing vagina with normal color and discharge, no lesions   CERVIX: normal appearing cervix without discharge or lesions, no CMT  Thin prep pap is {Desc; done/not:10129} *** HR HPV cotesting  UTERUS: uterus is felt to be normal  size, shape, consistency and nontender   ADNEXA: No adnexal masses or tenderness noted.  Extremities:  No swelling or varicosities noted  Chaperone present for exam  No results found for this or any previous visit (from the past 24 hour(s)).    Assessment & Plan:  There are no diagnoses linked to this encounter.  Labs/procedures today: ***    No orders of the defined types were placed in this encounter.   Meds: No orders of the defined types were placed in this encounter.   Follow-up: No follow-ups on file.  Lorriane Shire, MD 05/22/2023 2:53 PM

## 2023-05-24 LAB — CYTOLOGY - PAP
Comment: NEGATIVE
Diagnosis: NEGATIVE
High risk HPV: NEGATIVE

## 2023-06-17 ENCOUNTER — Other Ambulatory Visit: Payer: Self-pay | Admitting: Interventional Radiology

## 2023-06-17 DIAGNOSIS — E041 Nontoxic single thyroid nodule: Secondary | ICD-10-CM

## 2023-07-04 ENCOUNTER — Ambulatory Visit
Admission: RE | Admit: 2023-07-04 | Discharge: 2023-07-04 | Disposition: A | Discharge status: Home / Self Care | Payer: Managed Care, Other (non HMO) | Source: Ambulatory Visit | Attending: Interventional Radiology | Admitting: Interventional Radiology

## 2023-07-04 DIAGNOSIS — E041 Nontoxic single thyroid nodule: Secondary | ICD-10-CM

## 2023-07-08 NOTE — Progress Notes (Signed)
Reason for follow up:  Patient was seen in virtual telephone consultation today for symptomatic thyroid nodule   Referring Physician(s): Shamleffer,Ibtehal Jaralla   History of present illness: Initial HPI 05/01/22:  Cindy Christian is a 52 y.o. female with history of thyroid disorder and benign right inferior thyroid nodule.  She reports a history of atrial fibrillation in 2021 which was thought to potentially be due to hyperthyroidism, however underwent successful nodal ablation/cardioversion.  Last December she reports having hyperthyroidism by laboratory analysis, with TSH totally suppressed.  By March, this increased to 10.7, and has reduced to normal levels with levothyroxine.  Given thyroid dysregulation, thyroid ultrasound was obtained which demonstrated a TR5 (arguably TR4) nodule with possible extrathyroid component posteriorly, questioning possibility of parathyroid adenoma.  This underwent FNA on 01/12/22 which showed Bethesda II benign thyroid follicular tissue.  Latest thyroid labs in June are all normal.   She endorses some mild dysphagia requiring her to chew her food more consciously as some food tends to get stuck in her throat or is difficult to swallow.  Additionally, she complains of always feeling like she has to clear her throat but feels like she never can.  She thinks she may have some mild voice change, but no hoarseness or comments from friends or family.  Each of these symptoms started within the past year.   Thyroid Symptom Score: 3   Thyroid Cosmetic Score:  No palpable mass (Grade 1).  She was re-evaluated 06/23/22 via telephone follow up after 6 month thyroid ultrasound to assess any change in the thyroid nodule, as it was not amenable to RFA due to small size at prior visit. Thyroid ultrasound was completed on 06/27/22 and there was no significant change in the nodule (0.59 cc -->0.56 cc).  No change in symptoms. Given this information I recommended continued watchful  waiting with follow up in one year.   She presents today for follow up via virtual telephone visit.   Past Medical History:  Diagnosis Date   Allergy    seasonal allergies   Atrial fibrillation with RVR (HCC) 03/25/2020   had ablation-NSR    Borderline glaucoma    popa   Thyroiditis 12/08/2021    Past Surgical History:  Procedure Laterality Date   ABLATION  03/25/2020   ATRIAL FIBRILLATION ABLATION N/A 03/25/2020   Procedure: ATRIAL FIBRILLATION ABLATION;  Surgeon: Regan Lemming, MD;  Location: [facility] INVASIVE CV LAB;  Service: Cardiovascular;  Laterality: N/A;   BUBBLE STUDY  12/30/2019   Procedure: BUBBLE STUDY;  Surgeon: Parke Poisson, MD;  Location: [facility] ENDOSCOPY;  Service: Cardiovascular;;   CARDIOVERSION N/A 12/30/2019   Procedure: CARDIOVERSION;  Surgeon: Parke Poisson, MD;  Location: [facility] ENDOSCOPY;  Service: Cardiovascular;  Laterality: N/A;   FINGER FRACTURE SURGERY Left 1983   middle finger   IR RADIOLOGIST EVAL & MGMT  05/01/2022   IR RADIOLOGIST EVAL & MGMT  06/29/2022   KNEE ARTHROPLASTY Bilateral 2000 R 2008 L   LEEP  2004   CIN 1-2   TEE WITHOUT CARDIOVERSION N/A 12/30/2019   Procedure: TRANSESOPHAGEAL ECHOCARDIOGRAM (TEE);  Surgeon: Parke Poisson, MD;  Location: [facility] ENDOSCOPY;  Service: Cardiovascular;  Laterality: N/A;   WISDOM TOOTH EXTRACTION      Allergies: Penicillins  Medications: Prior to Admission medications   Not on File     Family History  Problem Relation Age of Onset   Colon polyps Mother 24   Diabetes Maternal Uncle  Type 1   Stroke Maternal Grandmother    Colon cancer Neg Hx    Esophageal cancer Neg Hx    Stomach cancer Neg Hx    Rectal cancer Neg Hx     Social History   Socioeconomic History   Marital status: Single    Spouse name: Not on file   Number of children: Not on file   Years of education: Not on file   Highest education level: Not on file  Occupational History   Occupation: admin    Comment:  Teaching laboratory technician  Tobacco Use   Smoking status: Former    Current packs/day: 0.00    Average packs/day: 0.3 packs/day for 13.0 years (3.3 ttl pk-yrs)    Types: Cigarettes    Start date: 04/24/1985    Quit date: 04/24/1998    Years since quitting: 25.2   Smokeless tobacco: Never  Vaping Use   Vaping status: Never Used  Substance and Sexual Activity   Alcohol use: Not Currently   Drug use: No   Sexual activity: Yes    Partners: Male    Birth control/protection: None  Other Topics Concern   Not on file  Social History Narrative   Walking daily   Social Determinants of Health   Financial Resource Strain: Not on file  Food Insecurity: Not on file  Transportation Needs: Not on file  Physical Activity: Not on file  Stress: Not on file  Social Connections: Not on file     Vital Signs: There were no vitals taken for this visit.  No physical exam was performed in lieu of virtual telephone visit.    Imaging: US Thyroid 12/25/21   Nodule volume = 0.59 cc   US Thyroid 06/27/22   Nodule volume = 0.56 cc    Labs:  CBC (10/06/21) WBC 4.6, Hb 12.5, Hct 38.4, Plt 263   Coags None recent.   TFTs Serum TSH - 4.31  (10/08/22)      Serum free T4 - 0.68 (07/10/22) Serum T3 - 113 (03/08/22) Thyroperoxidase Antibody - 1 (10/20/21) Thyroglobulin Antibody - not available Calcitonin - not available     Prior Thyroid FNA: 01/22/22 FINAL MICROSCOPIC DIAGNOSIS:  Consistent with benign follicular nodule (Bethesda category II)   Assessment and Plan:   52 year old female with mildly symptomatic, relatively small, benign right inferior thyroid nodule in the setting of thyroid dysregulation of uncertain etiology, likely thyroiditis.  Currently hypothyroid requiring low dose thyroid supplementation.   Electronically Signed: Mickie Kay 07/08/2023, 8:43 AM   I spent a total of 25 Minutes in virtual telephone clinical consultation, greater than 50% of which was  counseling/coordinating care for thyroid nodule.

## 2023-07-10 ENCOUNTER — Ambulatory Visit
Admission: RE | Admit: 2023-07-10 | Discharge: 2023-07-10 | Disposition: A | Discharge status: Home / Self Care | Payer: Commercial Managed Care - HMO | Source: Ambulatory Visit | Attending: Interventional Radiology | Admitting: Interventional Radiology

## 2023-07-10 DIAGNOSIS — E041 Nontoxic single thyroid nodule: Secondary | ICD-10-CM

## 2023-07-10 HISTORY — PX: IR RADIOLOGIST EVAL & MGMT: IMG5224

## 2023-08-01 ENCOUNTER — Emergency Department (HOSPITAL_COMMUNITY): Payer: Commercial Managed Care - HMO

## 2023-08-01 ENCOUNTER — Encounter (HOSPITAL_COMMUNITY): Payer: Self-pay

## 2023-08-01 ENCOUNTER — Emergency Department (HOSPITAL_COMMUNITY)
Admission: EM | Admit: 2023-08-01 | Discharge: 2023-08-02 | Disposition: A | Discharge status: Home / Self Care | Payer: Commercial Managed Care - HMO

## 2023-08-01 DIAGNOSIS — T7840XA Allergy, unspecified, initial encounter: Secondary | ICD-10-CM

## 2023-08-01 DIAGNOSIS — R0602 Shortness of breath: Secondary | ICD-10-CM | POA: Insufficient documentation

## 2023-08-01 DIAGNOSIS — R000 Tachycardia, unspecified: Secondary | ICD-10-CM | POA: Diagnosis not present

## 2023-08-01 DIAGNOSIS — R112 Nausea with vomiting, unspecified: Secondary | ICD-10-CM | POA: Diagnosis present

## 2023-08-01 LAB — CBC
HCT: 41.9 % (ref 36.0–46.0)
Hemoglobin: 13.7 g/dL (ref 12.0–15.0)
MCH: 31.5 pg (ref 26.0–34.0)
MCHC: 32.7 g/dL (ref 30.0–36.0)
MCV: 96.3 fL (ref 80.0–100.0)
Platelets: 351 10*3/uL (ref 150–400)
RBC: 4.35 MIL/uL (ref 3.87–5.11)
RDW: 12.7 % (ref 11.5–15.5)
WBC: 17.5 10*3/uL — ABNORMAL HIGH (ref 4.0–10.5)
nRBC: 0 % (ref 0.0–0.2)

## 2023-08-01 MED ORDER — FAMOTIDINE 20 MG PO TABS
40.0000 mg | ORAL_TABLET | Freq: Once | ORAL | Status: AC
  Administered 2023-08-01: 40 mg via ORAL
  Filled 2023-08-01: qty 2

## 2023-08-01 MED ORDER — EPINEPHRINE 0.3 MG/0.3ML IJ SOAJ: 0.3000 mg | Freq: Once | INTRAMUSCULAR | Status: AC

## 2023-08-01 MED ORDER — EPINEPHRINE 0.3 MG/0.3ML IJ SOAJ
INTRAMUSCULAR | Status: AC
  Administered 2023-08-01: 0.3 mg via INTRAMUSCULAR
  Filled 2023-08-01: qty 0.3

## 2023-08-01 MED ORDER — METHYLPREDNISOLONE SODIUM SUCC 125 MG IJ SOLR
125.0000 mg | Freq: Once | INTRAMUSCULAR | Status: AC
  Administered 2023-08-01: 125 mg via INTRAVENOUS
  Filled 2023-08-01: qty 2

## 2023-08-01 NOTE — ED Triage Notes (Signed)
Patient ate a meal that she has ingested before and is unsure what caused the allergic reaction. She turned red, burning eyes, increased HR, tongue swelling, nausea and diarrhea.

## 2023-08-02 ENCOUNTER — Telehealth: Payer: Self-pay

## 2023-08-02 LAB — TROPONIN I (HIGH SENSITIVITY)
Troponin I (High Sensitivity): 13 ng/L (ref <18)
Troponin I (High Sensitivity): 65 ng/L — ABNORMAL HIGH (ref <18)
Troponin I (High Sensitivity): 105 ng/L (ref <18)
Troponin I (High Sensitivity): 73 ng/L — ABNORMAL HIGH (ref <18)

## 2023-08-02 LAB — BASIC METABOLIC PANEL
Anion gap: 14 (ref 5–15)
BUN: 13 mg/dL (ref 6–20)
CO2: 18 mmol/L — ABNORMAL LOW (ref 22–32)
Calcium: 9.1 mg/dL (ref 8.9–10.3)
Chloride: 103 mmol/L (ref 98–111)
Creatinine, Ser: 0.79 mg/dL (ref 0.44–1.00)
GFR, Estimated: >60 mL/min (ref >60)
Glucose, Bld: 241 mg/dL — ABNORMAL HIGH (ref 70–99)
Potassium: 3.3 mmol/L — ABNORMAL LOW (ref 3.5–5.1)
Sodium: 135 mmol/L (ref 135–145)

## 2023-08-02 MED ORDER — EPINEPHRINE 0.3 MG/0.3ML IJ SOAJ: 0.3000 mg | INTRAMUSCULAR | 0 refills | Status: DC | PRN

## 2023-08-02 MED ORDER — PREDNISONE 10 MG PO TABS: 40.0000 mg | ORAL_TABLET | Freq: Every day | ORAL | 0 refills | Status: AC

## 2023-08-02 MED ORDER — SODIUM CHLORIDE 0.9 % IV BOLUS
1000.0000 mL | Freq: Once | INTRAVENOUS | Status: AC
  Administered 2023-08-02: 1000 mL via INTRAVENOUS

## 2023-08-02 MED ORDER — FAMOTIDINE 20 MG PO TABS
20.0000 mg | ORAL_TABLET | Freq: Every day | ORAL | 0 refills | Status: DC
Start: 2023-08-02 — End: 2024-01-03

## 2023-08-02 MED ORDER — DIPHENHYDRAMINE HCL 25 MG PO TABS
25.0000 mg | ORAL_TABLET | Freq: Four times a day (QID) | ORAL | 0 refills | Status: DC
Start: 2023-08-02 — End: 2024-01-03

## 2023-08-02 MED ORDER — DIAZEPAM 5 MG/ML IJ SOLN
5.0000 mg | Freq: Once | INTRAMUSCULAR | Status: AC
  Administered 2023-08-02: 5 mg via INTRAVENOUS
  Filled 2023-08-02: qty 2

## 2023-08-02 NOTE — ED Notes (Signed)
ED Provider at bedside. 

## 2023-08-02 NOTE — ED Notes (Signed)
Provider made aware that's pt's troponin went from 13 to 65.

## 2023-08-02 NOTE — ED Notes (Signed)
Pt now c/o left leg pain. Reports it feels like her back felt earlier during the shift. Pt extremely anxious. Standing on the side of the bed, but reports she can't walk.

## 2023-08-02 NOTE — ED Provider Notes (Signed)
Pt seen by Dr Maple Hudson last evening for an allergic reaction.  Pt noted to have some chest discomfort after her epi was given.  Troponin was ordered.   Noted to be elevated.  Plan was to repeat to make sure it is downward trending.    Pt has been chest pain free since.  Trop decreased now to 73 from 105.  Suspect related to the tachycardia.  Evaluation and diagnostic testing in the emergency department does not suggest an emergent condition requiring admission or immediate intervention beyond what has been performed at this time.  The patient is safe for discharge and has been instructed to return immediately for worsening symptoms, change in symptoms or any other concerns.    Linwood Dibbles, MD 08/02/23 [phone]

## 2023-08-02 NOTE — Transitions of Care (Post Inpatient/ED Visit) (Signed)
   08/02/2023  Name: Cindy Christian MRN: [id number] DOB: April 30, 1971  Today's TOC FU Call Status: Today's TOC FU Call Status:: Successful TOC FU Call Completed Unsuccessful Call (1st Attempt) Date: 08/02/23 [facility] FU Call Complete Date: 08/02/23 Patient's Name and Date of Birth confirmed.  Transition Care Management Follow-up Telephone Call Date of Discharge: 08/01/23 Discharge Facility: [facility]) Type of Discharge: Emergency Department Reason for ED Visit: Other: How have you been since you were released from the hospital?: Better Any questions or concerns?: No  Items Reviewed: Did you receive and understand the discharge instructions provided?: Yes Any new allergies since your discharge?: No Dietary orders reviewed?: Yes Do you have support at home?: Yes  Medications Reviewed Today: Medications Reviewed Today   Medications were not reviewed in this encounter     Home Care and Equipment/Supplies: Were Home Health Services Ordered?: NA Any new equipment or medical supplies ordered?: NA  Functional Questionnaire: Do you need assistance with bathing/showering or dressing?: No Do you need assistance with meal preparation?: No Do you need assistance with eating?: No Do you have difficulty maintaining continence: No Do you need assistance with getting out of bed/getting out of a chair/moving?: No Do you have difficulty managing or taking your medications?: No  Follow up appointments reviewed: PCP Follow-up appointment confirmed?: NA Specialist Hospital Follow-up appointment confirmed?: Yes Date of Specialist follow-up appointment?: 08/05/23 Follow-Up Specialty Provider:: [place] Do you need transportation to your follow-up appointment?: No Do you understand care options if your condition(s) worsen?: Yes-patient verbalized understanding    SIGNATURE Arvil Persons, BSN, RN

## 2023-08-02 NOTE — ED Provider Notes (Signed)
I assumed care of this patient from previous provider.  Please see their note for further details of history, exam, and MDM.   Briefly patient is a 52 y.o. female who presented with allergic reaction requiring epi x2 by EMS. Required 3rd dose here. Developed chest pain in the setting of persistent tachycardia. Pending labs and reassessment.   Initial trop was negative. 2nd trop 30 Patient given IVF for tachycardia. Valium for anxiety.  HR improved. 3rd trop up to 105. This is likely 2/2 demand from persistent tachycardia and epi use. Doubt ACS or other serious cardiac process. Has been chest pain free for >4hrs since HR improved. Will get 4th trop. Anticipate DC as long as trop does not significantly rise.   Patient care turned over to oncoming provider. Patient case and results discussed in detail; please see their note for further ED managment.      Nira Conn, MD 08/02/23 [phone]

## 2023-08-02 NOTE — ED Notes (Signed)
Pt yelling and hyperventilating stating she can't breathe. Pt out of bed hunched over onto stretcher. C/O back pain. Placed on 2L O2 via n/c. However, pt was never hypoxic. No cyanosis noted. Dr. Maple Hudson also at bedside and gave verbal order for IM epi.  Pt appears anxious. No acute resp distress noted at this time.

## 2023-08-02 NOTE — Transitions of Care (Post Inpatient/ED Visit) (Signed)
   08/02/2023  Name: Cindy Christian MRN: [id number] DOB: 30-Oct-1970  Today's TOC FU Call Status: Today's TOC FU Call Status:: Unsuccessful Call (1st Attempt) Unsuccessful Call (1st Attempt) Date: 08/02/23  Attempted to reach the patient regarding the most recent Inpatient/ED visit.  Follow Up Plan: Additional outreach attempts will be made to reach the patient to complete the Transitions of Care (Post Inpatient/ED visit) call.   Signature Arvil Persons, BSN, Charity fundraiser

## 2023-08-02 NOTE — ED Provider Notes (Signed)
[place] EMERGENCY DEPARTMENT AT [facility] Provider Note   CSN: [id number] Arrival date & time: 08/01/23  2227     History  Chief Complaint  Patient presents with  . Allergic Reaction    Cindy Christian is a 52 y.o. female.  52 year old female present emergency department with possible allergic reaction.  Had abrupt onset of skin flushing and nausea vomiting and shortness of breath after eating tuna for dinner.  Received epi and Zofran with EMS prior to arrival.  Reported improvement of symptoms.   Allergic Reaction      Home Medications Prior to Admission medications   Not on File      Allergies    Penicillins    Review of Systems   Review of Systems  Physical Exam Updated Vital Signs BP (!) 92/58   Pulse (!) 131   Temp 97.9 F (36.6 C) (Oral)   Resp 18   Ht 5\' 4"  (1.626 m)   Wt 81.6 kg   SpO2 100%   BMI 30.90 kg/m  Physical Exam Vitals and nursing note reviewed.  Constitutional:      General: She is not in acute distress. HENT:     Head: Normocephalic.     Nose: Nose normal.     Mouth/Throat:     Mouth: Mucous membranes are moist.  Eyes:     Conjunctiva/sclera: Conjunctivae normal.  Cardiovascular:     Rate and Rhythm: Regular rhythm. Tachycardia present.  Pulmonary:     Effort: Pulmonary effort is normal.     Breath sounds: Normal breath sounds.  Abdominal:     General: Abdomen is flat. There is no distension.     Tenderness: There is no abdominal tenderness. There is no guarding or rebound.  Musculoskeletal:        General: Normal range of motion.  Skin:    General: Skin is warm and dry.     Capillary Refill: Capillary refill takes less than 2 seconds.  Neurological:     Mental Status: She is alert and oriented to person, place, and time.  Psychiatric:        Mood and Affect: Mood normal.        Behavior: Behavior normal.     ED Results / Procedures / Treatments   Labs (all labs ordered are listed, but only abnormal  results are displayed) Labs Reviewed  CBC - Abnormal; Notable for the following components:      Result Value   WBC 17.5 (*)    All other components within normal limits  BASIC METABOLIC PANEL - Abnormal; Notable for the following components:   Potassium 3.3 (*)    CO2 18 (*)    Glucose, Bld 241 (*)    All other components within normal limits  TROPONIN I (HIGH SENSITIVITY)    EKG EKG Interpretation Date/Time:  Thursday August 01 2023 23:07:45 EDT Ventricular Rate:  159 PR Interval:  211 QRS Duration:  125 QT Interval:  311 QTC Calculation: 506 R Axis:   263  Text Interpretation: Sinus tachycardia Ventricular premature complex Prolonged PR interval Prominent P waves, nondiagnostic RBBB and LAFB Confirmed by Estanislado Pandy [phone]) on 08/01/2023 11:12:13 PM  Radiology No results found.  Procedures Procedures    Medications Ordered in ED Medications  methylPREDNISolone sodium succinate (SOLU-MEDROL) 125 mg/2 mL injection 125 mg (125 mg Intravenous Given 08/01/23 2251)  famotidine (PEPCID) tablet 40 mg (40 mg Oral Given 08/01/23 2251)  EPINEPHrine (EPI-PEN) injection 0.3 mg (0.3 mg Intramuscular  Given 08/01/23 2300)    ED Course/ Medical Decision Making/ A&P Clinical Course as of 08/02/23 1504  Thu Aug 01, 2023  2314 Called to bedside, patient stating that she is having difficulty breathing and her lungs are tight.  No angioedema.  Some minor wheezing.  Redosed epi.  Also complaining of chest pain.  Will get cardiac labs. [TY]  Fri Aug 02, 2023  0028 Patient currently resting comfortably.  Blood pressure 110 systolic.  Heart rate in the 120s. [TY]  0805 Troponin I (High Sensitivity)(!) [JK]    Clinical Course User Index [JK] Linwood Dibbles, MD [TY] Coral Spikes, DO                                 Medical Decision Making 52 year old female present emergency department for possible allergic reaction.  She has no history of allergies.  Received epi x 2 and Zofran prior  to arrival as well as taking home Benadryl 50 mg.  She had a further episode of complaint of shortness of breath.  The emergency department received further epi.  Was also complaining of central crushing chest pain.  Labs and chest x-ray ordered out of abundance of caution given her complaint of chest pain.   Amount and/or Complexity of Data Reviewed Labs: ordered. Radiology: ordered.  Risk Prescription drug management.            Final Clinical Impression(s) / ED Diagnoses Final diagnoses:  None    Rx / DC Orders ED Discharge Orders     None         Coral Spikes, DO 08/02/23 1504

## 2023-10-15 ENCOUNTER — Encounter: Payer: Managed Care, Other (non HMO) | Admitting: Nurse Practitioner

## 2024-01-03 ENCOUNTER — Ambulatory Visit (INDEPENDENT_AMBULATORY_CARE_PROVIDER_SITE_OTHER): Payer: Managed Care, Other (non HMO) | Admitting: Nurse Practitioner

## 2024-01-03 ENCOUNTER — Encounter: Payer: Self-pay | Admitting: Nurse Practitioner

## 2024-01-03 VITALS — BP 124/78 | HR 82 | Temp 98.4°F | Ht 63.5 in | Wt 175.8 lb

## 2024-01-03 DIAGNOSIS — Z0000 Encounter for general adult medical examination without abnormal findings: Secondary | ICD-10-CM

## 2024-01-03 DIAGNOSIS — Z136 Encounter for screening for cardiovascular disorders: Secondary | ICD-10-CM

## 2024-01-03 DIAGNOSIS — Z23 Encounter for immunization: Secondary | ICD-10-CM | POA: Diagnosis not present

## 2024-01-03 DIAGNOSIS — Z13220 Encounter for screening for lipoid disorders: Secondary | ICD-10-CM

## 2024-01-03 DIAGNOSIS — Z8639 Personal history of other endocrine, nutritional and metabolic disease: Secondary | ICD-10-CM

## 2024-01-03 DIAGNOSIS — Z1231 Encounter for screening mammogram for malignant neoplasm of breast: Secondary | ICD-10-CM

## 2024-01-03 LAB — COMPREHENSIVE METABOLIC PANEL WITH GFR
ALT: 14 U/L (ref 0–35)
AST: 22 U/L (ref 0–37)
Albumin: 4.8 g/dL (ref 3.5–5.2)
Alkaline Phosphatase: 58 U/L (ref 39–117)
BUN: 9 mg/dL (ref 6–23)
CO2: 30 meq/L (ref 19–32)
Calcium: 9.6 mg/dL (ref 8.4–10.5)
Chloride: 101 meq/L (ref 96–112)
Creatinine, Ser: 0.61 mg/dL (ref 0.40–1.20)
GFR: 101.56 mL/min (ref >60.00)
Glucose, Bld: 82 mg/dL (ref 70–99)
Potassium: 3.6 meq/L (ref 3.5–5.1)
Sodium: 140 meq/L (ref 135–145)
Total Bilirubin: 0.7 mg/dL (ref 0.2–1.2)
Total Protein: 8 g/dL (ref 6.0–8.3)

## 2024-01-03 LAB — TSH: TSH: 3.34 u[IU]/mL (ref 0.35–5.50)

## 2024-01-03 LAB — LIPID PANEL
Cholesterol: 202 mg/dL — ABNORMAL HIGH (ref 0–200)
HDL: 48 mg/dL (ref >39.00)
LDL Cholesterol: 133 mg/dL — ABNORMAL HIGH (ref 0–99)
NonHDL: 153.7
Total CHOL/HDL Ratio: 4
Triglycerides: 104 mg/dL (ref 0.0–149.0)
VLDL: 20.8 mg/dL (ref 0.0–40.0)

## 2024-01-03 NOTE — Patient Instructions (Signed)
 [place] Send mychart message if no improvement  Go to lab Maintain Heart healthy diet and daily exercise.

## 2024-01-03 NOTE — Progress Notes (Signed)
 Complete physical exam  Patient: Cindy Christian   DOB: [birth year]   53 y.o. Female  MRN: [id number] Visit Date: 01/03/2024  Subjective:    Chief Complaint  Patient presents with   Annual Exam    [facility] needs Mammogram order    Cindy Christian is a 53 y.o. female who presents today for a complete physical exam. She reports consuming a low fat and low sodium diet. Gym/ health club routine includes cardio and mod to heavy weightlifting. She generally feels well. She reports sleeping well. She does not have additional problems to discuss today.  Vision:No Dental:Yes STD Screen:No  BP Readings from Last 3 Encounters:  01/03/24 124/78  08/02/23 125/79  05/22/23 (!) 148/75   Wt Readings from Last 3 Encounters:  01/03/24 175 lb 12.8 oz (79.7 kg)  08/01/23 180 lb (81.6 kg)  05/22/23 178 lb (80.7 kg)    Most recent fall risk assessment:    01/03/2024    8:36 AM  Fall Risk   Falls in the past year? 0  Number falls in past yr: 0  Injury with Fall? 0  Risk for fall due to : No Fall Risks  Follow up Falls evaluation completed     Depression screen:Yes - No Depression Most recent depression screenings:    01/03/2024    8:37 AM 05/22/2023    2:37 PM  PHQ 2/9 Scores  PHQ - 2 Score 0 0  PHQ- 9 Score 0 2    HPI  No problem-specific Assessment & Plan notes found for this encounter.   Past Medical History:  Diagnosis Date   Allergy    seasonal allergies   Atrial fibrillation with RVR (HCC) 03/25/2020   had ablation-NSR    Borderline glaucoma    popa   Thyroiditis 12/08/2021   Past Surgical History:  Procedure Laterality Date   ABLATION  03/25/2020   ATRIAL FIBRILLATION ABLATION N/A 03/25/2020   Procedure: ATRIAL FIBRILLATION ABLATION;  Surgeon: Regan Lemming, MD;  Location: [facility] INVASIVE CV LAB;  Service: Cardiovascular;  Laterality: N/A;   BUBBLE STUDY  12/30/2019   Procedure: BUBBLE STUDY;  Surgeon: Parke Poisson, MD;  Location: [facility] ENDOSCOPY;  Service:  Cardiovascular;;   CARDIOVERSION N/A 12/30/2019   Procedure: CARDIOVERSION;  Surgeon: Parke Poisson, MD;  Location: [facility] ENDOSCOPY;  Service: Cardiovascular;  Laterality: N/A;   FINGER FRACTURE SURGERY Left 1983   middle finger   IR RADIOLOGIST EVAL & MGMT  05/01/2022   IR RADIOLOGIST EVAL & MGMT  06/29/2022   IR RADIOLOGIST EVAL & MGMT  07/10/2023   KNEE ARTHROPLASTY Bilateral 2000 R 2008 L   LEEP  2004   CIN 1-2   TEE WITHOUT CARDIOVERSION N/A 12/30/2019   Procedure: TRANSESOPHAGEAL ECHOCARDIOGRAM (TEE);  Surgeon: Parke Poisson, MD;  Location: [facility] ENDOSCOPY;  Service: Cardiovascular;  Laterality: N/A;   WISDOM TOOTH EXTRACTION     Social History   Socioeconomic History   Marital status: Single    Spouse name: Not on file   Number of children: Not on file   Years of education: Not on file   Highest education level: Bachelor's degree (e.g., BA, AB, BS)  Occupational History   Occupation: admin    Comment: Teaching laboratory technician  Tobacco Use   Smoking status: Former    Current packs/day: 0.00    Average packs/day: 0.3 packs/day for 13.0 years (3.3 ttl pk-yrs)    Types: Cigarettes    Start date: 04/24/1985    Quit  date: 04/24/1998    Years since quitting: 25.7   Smokeless tobacco: Never  Vaping Use   Vaping status: Never Used  Substance and Sexual Activity   Alcohol use: Not Currently   Drug use: No   Sexual activity: Yes    Partners: Male    Birth control/protection: None  Other Topics Concern   Not on file  Social History Narrative   Walking daily   Social Drivers of Health   Financial Resource Strain: Patient Declined (01/02/2024)   Overall Financial Resource Strain (CARDIA)    Difficulty of Paying Living Expenses: Patient declined  Food Insecurity: Patient Declined (01/02/2024)   Hunger Vital Sign    Worried About Running Out of Food in the Last Year: Patient declined    Ran Out of Food in the Last Year: Patient declined  Transportation Needs: No Transportation  Needs (01/02/2024)   PRAPARE - Administrator, Civil Service (Medical): No    Lack of Transportation (Non-Medical): No  Physical Activity: Sufficiently Active (01/02/2024)   Exercise Vital Sign    Days of Exercise per Week: 6 days    Minutes of Exercise per Session: 40 min  Stress: No Stress Concern Present (01/02/2024)   [employer] of Occupational Health - Occupational Stress Questionnaire    Feeling of Stress : Not at all  Social Connections: Unknown (01/02/2024)   Social Connection and Isolation Panel [NHANES]    Frequency of Communication with Friends and Family: Patient declined    Frequency of Social Gatherings with Friends and Family: Patient declined    Attends Religious Services: Patient declined    Database administrator or Organizations: Patient declined    Attends Engineer, structural: Not on file    Marital Status: Patient declined  Catering manager Violence: Not on file   Family Status  Relation Name Status   Mother  Alive   Father  Alive   Mat Uncle T1 (Not Specified)   MGM E1 (Not Specified)   Neg Hx  (Not Specified)  No partnership data on file   Family History  Problem Relation Age of Onset   Colon polyps Mother 59   Diabetes Maternal Uncle        Type 1   Stroke Maternal Grandmother    Colon cancer Neg Hx    Esophageal cancer Neg Hx    Stomach cancer Neg Hx    Rectal cancer Neg Hx    Allergies  Allergen Reactions   Penicillins Other (See Comments)    Childhood allergy Did it involve swelling of the face/tongue/throat, SOB, or low BP? Unknown Did it involve sudden or severe rash/hives, skin peeling, or any reaction on the inside of your mouth or nose? Unknown Did you need to seek medical attention at a hospital or doctor's office? Unknown When did it last happen?     young child  If all above answers are "NO", may proceed with cephalosporin use.    Patient Care Team: Sterling Mondo, Bonna Gains, NP as PCP - General (Internal  Medicine) Regan Lemming, MD as PCP - Electrophysiology (Cardiology) O'Neal, Ronnald Ramp, MD as PCP - Cardiology (Cardiology) Lucinda Dell, OD as Referring Physician (Optometry) Lorriane Shire, MD as Consulting Physician (Obstetrics and Gynecology)   Medications: Outpatient Medications Prior to Visit  Medication Sig   [DISCONTINUED] diphenhydrAMINE (BENADRYL) 25 MG tablet Take 1 tablet (25 mg total) by mouth every 6 (six) hours for 5 days.   [DISCONTINUED] EPINEPHrine 0.3 mg/0.3 mL IJ  SOAJ injection Inject 0.3 mg into the muscle as needed for anaphylaxis.   [DISCONTINUED] famotidine (PEPCID) 20 MG tablet Take 1 tablet (20 mg total) by mouth daily for 5 days.   No facility-administered medications prior to visit.    Review of Systems  Constitutional:  Negative for activity change, appetite change and unexpected weight change.  Respiratory: Negative.    Cardiovascular: Negative.   Gastrointestinal: Negative.   Endocrine: Negative for cold intolerance and heat intolerance.  Genitourinary: Negative.   Musculoskeletal: Negative.   Skin: Negative.   Neurological: Negative.   Hematological: Negative.   Psychiatric/Behavioral:  Negative for behavioral problems, decreased concentration, dysphoric mood, hallucinations, self-injury, sleep disturbance and suicidal ideas. The patient is not nervous/anxious.         Objective:  BP 124/78 (BP Location: Left Arm, Patient Position: Sitting, Cuff Size: Normal)   Pulse 82   Temp 98.4 F (36.9 C) (Temporal)   Ht 5' 3.5" (1.613 m)   Wt 175 lb 12.8 oz (79.7 kg)   SpO2 99%   BMI 30.65 kg/m     Physical Exam Vitals and nursing note reviewed.  Constitutional:      General: She is not in acute distress. HENT:     Right Ear: Tympanic membrane, ear canal and external ear normal.     Left Ear: Tympanic membrane, ear canal and external ear normal.     Nose: Nose normal.  Eyes:     Extraocular Movements: Extraocular movements intact.      Conjunctiva/sclera: Conjunctivae normal.     Pupils: Pupils are equal, round, and reactive to light.  Neck:     Thyroid: No thyroid mass, thyromegaly or thyroid tenderness.  Cardiovascular:     Rate and Rhythm: Normal rate and regular rhythm.     Pulses: Normal pulses.     Heart sounds: Normal heart sounds.  Pulmonary:     Effort: Pulmonary effort is normal.     Breath sounds: Normal breath sounds.  Abdominal:     General: Bowel sounds are normal.     Palpations: Abdomen is soft.  Musculoskeletal:        General: Normal range of motion.     Cervical back: Normal range of motion and neck supple.     Right lower leg: No edema.     Left lower leg: No edema.  Lymphadenopathy:     Cervical: No cervical adenopathy.  Skin:    General: Skin is warm and dry.  Neurological:     Mental Status: She is alert and oriented to person, place, and time.     Cranial Nerves: No cranial nerve deficit.  Psychiatric:        Mood and Affect: Mood normal.        Behavior: Behavior normal.        Thought Content: Thought content normal.     No results found for any visits on 01/03/24.    Assessment & Plan:    Routine Health Maintenance and Physical Exam  Immunization History  Administered Date(s) Administered   PFIZER(Purple Top)SARS-COV-2 Vaccination 07/03/2020, 07/24/2020    Health Maintenance  Topic Date Due   COVID-19 Vaccine (3 - 2024-25 season) 01/19/2024 (Originally 06/02/2023)   Zoster Vaccines- Shingrix (1 of 2) 04/03/2024 (Originally 11/15/2019)   Hepatitis C Screening  01/02/2025 (Originally 11/15/1987)   INFLUENZA VACCINE  05/01/2024   MAMMOGRAM  02/03/2025   Cervical Cancer Screening (HPV/Pap Cotest)  05/21/2028   Colonoscopy  08/12/2030   HIV Screening  Completed   HPV VACCINES  Aged Out   DTaP/Tdap/Td  Discontinued   Discussed health benefits of physical activity, and encouraged her to engage in regular exercise appropriate for her age and condition.  Problem List  Items Addressed This Visit     History of thyroiditis   Relevant Orders   TSH   Other Visit Diagnoses       Preventative health care    -  Primary   Relevant Orders   Comprehensive metabolic panel with GFR   Lipid panel     Breast cancer screening by mammogram       Relevant Orders   MM 3D SCREENING MAMMOGRAM BILATERAL BREAST     Encounter for lipid screening for cardiovascular disease         Need for shingles vaccine          Return in about 1 year (around 01/02/2025) for CPE (fasting).     Alysia Penna, NP

## 2024-01-05 ENCOUNTER — Encounter: Payer: Self-pay | Admitting: Nurse Practitioner

## 2024-02-10 ENCOUNTER — Ambulatory Visit (HOSPITAL_BASED_OUTPATIENT_CLINIC_OR_DEPARTMENT_OTHER)
Admission: RE | Admit: 2024-02-10 | Discharge: 2024-02-10 | Disposition: A | Discharge status: Home / Self Care | Source: Ambulatory Visit | Attending: Nurse Practitioner | Admitting: Nurse Practitioner

## 2024-02-10 ENCOUNTER — Encounter (HOSPITAL_BASED_OUTPATIENT_CLINIC_OR_DEPARTMENT_OTHER): Payer: Self-pay

## 2024-02-10 DIAGNOSIS — Z1231 Encounter for screening mammogram for malignant neoplasm of breast: Secondary | ICD-10-CM | POA: Insufficient documentation

## 2024-04-15 ENCOUNTER — Encounter: Payer: Self-pay | Admitting: Sports Medicine

## 2024-04-15 ENCOUNTER — Ambulatory Visit (INDEPENDENT_AMBULATORY_CARE_PROVIDER_SITE_OTHER): Admitting: Sports Medicine

## 2024-04-15 ENCOUNTER — Ambulatory Visit (HOSPITAL_BASED_OUTPATIENT_CLINIC_OR_DEPARTMENT_OTHER)
Admission: RE | Admit: 2024-04-15 | Discharge: 2024-04-15 | Disposition: A | Discharge status: Home / Self Care | Source: Ambulatory Visit | Attending: Sports Medicine | Admitting: Sports Medicine

## 2024-04-15 VITALS — BP 110/80 | Ht 63.5 in | Wt 175.0 lb

## 2024-04-15 DIAGNOSIS — M25532 Pain in left wrist: Secondary | ICD-10-CM | POA: Diagnosis not present

## 2024-04-15 DIAGNOSIS — M79645 Pain in left finger(s): Secondary | ICD-10-CM | POA: Insufficient documentation

## 2024-04-15 DIAGNOSIS — M67442 Ganglion, left hand: Secondary | ICD-10-CM | POA: Diagnosis not present

## 2024-04-15 NOTE — Progress Notes (Signed)
   Subjective:    Patient ID: Cindy Christian, female    DOB: 04/04/1971, 52 y.o.   MRN: [id number]  HPI chief complaint: Left wrist pain and bump  Cindy Christian presents today complaining of left wrist pain has been present for about 6 months and a small bump that has arisen at her left MCP joint approximately 6 weeks ago.  She denies any recent or remote trauma.  For the past 6 months, she has noticed pain along the radial wrist and thumb that is present when putting pressure on her wrist in an extended position such as with certain yoga poses.  Pain is bothersome to the point that she has given up yoga but, despite that, she noticed a small bump 6 weeks ago at the MCP joint.  Bump is only minimally painful.  She has tried Mobic but has recently started applying Arnica.  She denies pain elsewhere in the hand or wrist.  She is right-hand-dominant.  Past medical history reviewed Medications reviewed Allergies reviewed  Review of Systems As above    Objective:   Physical Exam  Well-developed, well-nourished.  No acute distress  Left wrist: Good wrist range of motion.  There is no swelling.  Negative Finkelstein's.  Examination of the left thumb shows a negative CMC grind.  There is a palpable cyst along the ulnar aspect of the MCP joint.  It is slightly tender to palpation.  Full range of motion at MCP and IP joints.  MSK ultrasound of the left thumb does show a small cyst originating from the MCP joint      Assessment & Plan:   Left thumb MCP cyst  Cyst may either be a mucinous cyst or a ganglion cyst.  We did briefly discuss referral to hand surgery but she is not interested in surgery.  I reassured her that the cyst is benign and that they oftentimes resolve on their own.  I do think the wrist pain that she experiences with her yoga poses is originating from source other than her thumb.  I would like to get an x-ray of both her left wrist and her thumb and I will MyChart message her with those  results when available.  In the meantime, she may continue with her Arnica or, alternatively, try some Voltaren gel.  She may also apply heat or ice, whichever one she finds to be more helpful.  This note was dictated using Dragon naturally speaking software and may contain errors in syntax, spelling, or content which have not been identified prior to signing this note.

## 2024-04-16 ENCOUNTER — Encounter: Payer: Self-pay | Admitting: Sports Medicine

## 2025-01-15 ENCOUNTER — Ambulatory Visit: Payer: Self-pay | Admitting: Nurse Practitioner
